# Patient Record
Sex: Female | Born: 1965
Health system: Southern US, Community
[De-identification: ages and names within clinical notes are randomized; demographics above are authoritative.]

## PROBLEM LIST (undated history)

## (undated) DIAGNOSIS — Z853 Personal history of malignant neoplasm of breast: Secondary | ICD-10-CM

## (undated) DIAGNOSIS — C801 Malignant (primary) neoplasm, unspecified: Secondary | ICD-10-CM

## (undated) DIAGNOSIS — F419 Anxiety disorder, unspecified: Secondary | ICD-10-CM

## (undated) DIAGNOSIS — M1990 Unspecified osteoarthritis, unspecified site: Secondary | ICD-10-CM

## (undated) DIAGNOSIS — M8580 Other specified disorders of bone density and structure, unspecified site: Secondary | ICD-10-CM

## (undated) HISTORY — PX: COSMETIC SURGERY: SHX468

## (undated) HISTORY — DX: Malignant (primary) neoplasm, unspecified: C80.1

## (undated) HISTORY — DX: Other specified disorders of bone density and structure, unspecified site: M85.80

---

## 1999-06-13 ENCOUNTER — Other Ambulatory Visit: Admission: RE | Admit: 1999-06-13 | Discharge: 1999-06-13 | Payer: Self-pay | Admitting: Obstetrics and Gynecology

## 2001-03-23 ENCOUNTER — Other Ambulatory Visit: Admission: RE | Admit: 2001-03-23 | Discharge: 2001-03-23 | Payer: Self-pay | Admitting: Obstetrics & Gynecology

## 2001-10-31 ENCOUNTER — Inpatient Hospital Stay (HOSPITAL_COMMUNITY): Admission: AD | Admit: 2001-10-31 | Discharge: 2001-11-03 | Payer: Self-pay | Admitting: *Deleted

## 2001-11-04 ENCOUNTER — Encounter: Admission: RE | Admit: 2001-11-04 | Discharge: 2001-12-04 | Payer: Self-pay | Admitting: Obstetrics & Gynecology

## 2001-11-30 ENCOUNTER — Other Ambulatory Visit: Admission: RE | Admit: 2001-11-30 | Discharge: 2001-11-30 | Payer: Self-pay | Admitting: Obstetrics & Gynecology

## 2004-05-16 ENCOUNTER — Ambulatory Visit (HOSPITAL_COMMUNITY): Admission: RE | Admit: 2004-05-16 | Discharge: 2004-05-16 | Payer: Self-pay | Admitting: Obstetrics & Gynecology

## 2008-05-17 ENCOUNTER — Ambulatory Visit (HOSPITAL_COMMUNITY): Admission: RE | Admit: 2008-05-17 | Discharge: 2008-05-17 | Payer: Self-pay | Admitting: Obstetrics & Gynecology

## 2009-05-19 ENCOUNTER — Ambulatory Visit (HOSPITAL_COMMUNITY): Admission: RE | Admit: 2009-05-19 | Discharge: 2009-05-19 | Payer: Self-pay | Admitting: Obstetrics & Gynecology

## 2010-07-12 ENCOUNTER — Encounter (HOSPITAL_COMMUNITY): Payer: BC Managed Care – PPO

## 2010-07-12 LAB — SURGICAL PCR SCREEN: MRSA, PCR: NEGATIVE

## 2010-07-12 LAB — CBC
MCH: 29.7 pg (ref 26.0–34.0)
MCV: 88 fL (ref 78.0–100.0)
RDW: 13.3 % (ref 11.5–15.5)

## 2010-07-13 LAB — RPR: RPR Ser Ql: NONREACTIVE

## 2010-07-17 ENCOUNTER — Other Ambulatory Visit: Payer: Self-pay | Admitting: Obstetrics and Gynecology

## 2010-07-17 ENCOUNTER — Inpatient Hospital Stay (HOSPITAL_COMMUNITY)
Admission: AD | Admit: 2010-07-17 | Discharge: 2010-07-20 | DRG: 371 | Disposition: A | Discharge status: Home / Self Care | Payer: BC Managed Care – PPO | Source: Ambulatory Visit | Attending: Obstetrics and Gynecology | Admitting: Obstetrics and Gynecology

## 2010-07-17 DIAGNOSIS — Z01818 Encounter for other preprocedural examination: Secondary | ICD-10-CM

## 2010-07-17 DIAGNOSIS — Z302 Encounter for sterilization: Secondary | ICD-10-CM

## 2010-07-17 DIAGNOSIS — O09529 Supervision of elderly multigravida, unspecified trimester: Secondary | ICD-10-CM | POA: Diagnosis present

## 2010-07-17 DIAGNOSIS — O34219 Maternal care for unspecified type scar from previous cesarean delivery: Principal | ICD-10-CM | POA: Diagnosis present

## 2010-07-17 DIAGNOSIS — Z01812 Encounter for preprocedural laboratory examination: Secondary | ICD-10-CM

## 2010-07-18 HISTORY — PX: TUBAL LIGATION: SHX77

## 2010-07-18 LAB — CBC
HCT: 38.5 % (ref 36.0–46.0)
Hemoglobin: 13 g/dL (ref 12.0–15.0)
MCH: 29.7 pg (ref 26.0–34.0)
MCHC: 33.8 g/dL (ref 30.0–36.0)
MCV: 88.1 fL (ref 78.0–100.0)
RBC: 4.37 MIL/uL (ref 3.87–5.11)

## 2010-07-19 ENCOUNTER — Inpatient Hospital Stay (HOSPITAL_COMMUNITY)
Admission: RE | Admit: 2010-07-19 | Payer: BC Managed Care – PPO | Source: Ambulatory Visit | Admitting: Obstetrics & Gynecology

## 2010-07-19 LAB — CBC
RBC: 3.93 MIL/uL (ref 3.87–5.11)
WBC: 12.1 10*3/uL — ABNORMAL HIGH (ref 4.0–10.5)

## 2010-07-20 NOTE — Op Note (Signed)
Lori [name], BRAU NO.:  [id number]   MEDICAL RECORD NO.:  [id number]                   PATIENT TYPE:  INP   LOCATION:  9129                                 FACILITY:  [facility]   PHYSICIAN:  Gerri Spore B. Earlene Plater, M.D.               DATE OF BIRTH:  Jan 27, 1966   DATE OF PROCEDURE:  03/03/2002  DATE OF DISCHARGE:                                 OPERATIVE REPORT   PREOPERATIVE DIAGNOSES:  1. Nonreassuring fetal heart rate tracing.  2. Chorioamnionitis.  3. Failure to descend.  4. Suspected macrosomia.   POSTOPERATIVE DIAGNOSES:  1. Nonreassuring fetal heart rate tracing.  2. Chorioamnionitis.  3. Failure to descend.  4. Suspected macrosomia.   PROCEDURE:  Primary low transverse cesarean section.   SURGEON:  Chester Holstein. Earlene Plater, M.D.   ASSISTANT:  Scrub tech.   ANESTHESIA:  Epidural.   FINDINGS:  Viable female, Apgars 9 and 9.  Weight 9 pounds 12 ounces.  Normal uterus, tubes and ovaries.   ESTIMATED BLOOD LOSS:  600 cc.   FLUIDS:  1000 cc.   URINE OUTPUT:  100 cc.   COMPLICATIONS:  None.   DRAINS:  Foley.   DISPOSITION:  To the recovery room stable.   INDICATIONS:  The patient presented in spontaneous labor, progressed to  complete/complete, +1.  Developed fever in labor and subsequently developed  nonreassuring tracing and was therefore taken for primary cesarean section.   DESCRIPTION OF PROCEDURE:  The patient was taken to the operating room and  epidural anesthesia redosed and deemed adequate for surgery.  She was  prepped and draped in the standard fashion, placed in the supine position  with a leftward tilt.  Foley catheter was already in the bladder.   A Pfannenstiel incision was made with the knife after local infiltration  with 10 cc of 0.5% Marcaine.  The dissection was carried sharply to the  underlying fascia.  The fascia was divided in the midline sharply and  extended laterally with Mayo scissors.  Kocher clamps were used to  elevate  the superior aspect of the incision, and its underlying rectus muscles were  dissected off sharply.  The inferior aspect done in a similar fashion.  The  midline of the rectus muscles was identified, elevated and the posterior  sheath and peritoneum elevated and entered sharply with Metzenbaum scissors,  extended superiorly and inferiorly with good visualization of the  surrounding organs.   The bladder blade was inserted.  The vesicouterine peritoneum was  identified, elevated and bladder flap created with sharp and blunt  technique.   The bladder blade was reinserted, and the uterine incision made in a low  transverse fashion with the knife, extended laterally bluntly.  Clear fluid  was again noted to empty into the cavity.  The infant's head was elevated  and delivered through the incision.  The nuchal cord x2 was noted.  The  nose  and mouth were suctioned with the bulb, and the remainder of the infant  delivered without difficulty.  The cord was clamped and cut and the infant  handed off to the awaiting pediatricians.  The patient already had received  ampicillin and gentamicin intrapartum, and therefore no additional  antibiotics were administered.   The placenta was removed by uterine massage.  The uterus was exteriorized  and cleared of all clots and debris.  Then the uterine incision was  inspected and was free of extension.  It was then closed in a running locked  stitch of 0 Monocryl.  A second imbricating layer was placed with the same  suture.  Hemostasis was obtained.   The uterus was returned to the abdomen, the pelvis irrigated.  The uterine  incision was reinspected and made hemostatic with the Bovie.  The bladder  flap and subfascial spaces were hemostatic.  The fascia was closed with a  running stitch of 0 Vicryl.  The subcutaneous tissue was irrigated and made  hemostatic with the Bovie.  The skin was closed with staples.   The patient tolerated the  procedure well with no complications.  She was  taken to the recovery room awake, alert and in stable condition.  All counts  were correct.                                               Gerri Spore B. Earlene Plater, M.D.    WBD/MEDQ  D:  10/31/2001  T:  11/02/2001  Job:  [id number]

## 2010-07-20 NOTE — Discharge Summary (Signed)
   NAMEBONNI, NEUSER NO.:  [id number]   MEDICAL RECORD NO.:  [id number]                   PATIENT TYPE:  INP   LOCATION:  9129                                 FACILITY:  [facility]   PHYSICIAN:  Gerri Spore B. Earlene Plater, M.D.               DATE OF BIRTH:  1965/03/09   DATE OF ADMISSION:  10/31/2001  DATE OF DISCHARGE:  11/03/2001                                 DISCHARGE SUMMARY   ADMISSION DIAGNOSES:  Term intrauterine pregnancy, spontaneous labor,  failure to descend, chorioamnionitis.   DISCHARGE DIAGNOSES:  Term intrauterine pregnancy, spontaneous labor,  failure to descend, chorioamnionitis.   PROCEDURE:  Admission for spontaneous labor, primary cesarean section for  failure to descend greater than +1 station, and chorioamnionitis.   FINDINGS:  Viable female infant 9 pounds 12 ounces.  Apgars 9/9.  Nuchal  cord x2.  Normal uterus, tubes, and ovaries.   HISTORY OF PRESENT ILLNESS:  The patient was admitted in spontaneous labor  at 40 weeks 0 days.  Subsequently progressed to complete complete and +1.  Was pushing for about an hour when she developed a nonreassuring tracing  with associated fever in labor.  In addition, while patient was pushing  there was no descent.  She was at +1 station and suspicion for macrosomia.  Therefore, proceeded with primary cesarean section.  Findings at time of  surgery as outlined above.   Postoperatively patient rapidly regained her ability to ambulate, void, and  tolerate a regular diet.  She was discharged home on the third postoperative  day in satisfactory condition.   DISCHARGE INSTRUCTIONS:  Standard preprinted instructions were given prior  to dismissal.   DISCHARGE MEDICATIONS:  1. Motrin 600 mg p.o. q.6h.  2. Tylox one to two p.o. q.4-6h.  3. Ferrous sulfate 325 mg p.o. daily.   FOLLOW UP:  [facility] four to six weeks for postpartum visit.                                               Gerri Spore B.  Earlene Plater, M.D.    WBD/MEDQ  D:  11/03/2001  T:  11/03/2001  Job:  [phone] [id number]

## 2010-07-21 NOTE — Op Note (Signed)
Lori [name], Lori [name] NO.:  [id number]  MEDICAL RECORD NO.:  [id number]           PATIENT TYPE:  LOCATION:                                 FACILITY:  PHYSICIAN:  Maxie Better, M.D.DATE OF BIRTH:  09-Nov-1965  DATE OF PROCEDURE:  07/18/2010 DATE OF DISCHARGE:                              OPERATIVE REPORT   PREOPERATIVE DIAGNOSES:  Spontaneous rupture of membranes, term gestation, previous cesarean section, desires sterilization.  POSTOPERATIVE DIAGNOSES:  Spontaneous rupture of membranes, term gestation, previous cesarean section, desires sterilization.  PROCEDURE:  Repeat cesarean section, Sharl Ma hysterotomy, modified Pomeroy tubal ligation.  ANESTHESIA:  Spinal.  SURGEON:  Maxie Better, MD  ASSISTANT:  None.  INDICATIONS:  A 45 year old gravida 2, para 1 female, previous cesarean section, now at term presented with spontaneous rupture of membranes. The patient does not desire attempted vaginal delivery.  She also desires permanent sterilization.  Prenatal course had been notable for increased risk for Down syndrome on her ultra screen for which the patient underwent an amnio with normal chromosomal studies.  Surgical risks was reviewed with the patient, consent signed.  The patient was transferred to the operating room.  PROCEDURE:  Under adequate spinal anesthesia, the patient was placed in supine position with a left lateral tilt.  She was sterilely prepped and draped in usual fashion.  Indwelling Foley catheter was sterilely placed.  Marcaine 0.25% was injected along the previous Pfannenstiel skin incision.  Pfannenstiel skin incision was then made carried down to the rectus fascia.  Rectus fascia was opened transversely.  Rectus fascia was then bluntly and sharply dissected off the rectus muscle in superior and inferior fashion.  The rectus muscle was split in midline. The parietal peritoneum was entered sharply and extended.  The  bladder had some adhesions, vesicouterine peritoneum was opened transversely. The bladder was carefully dissected off the lower uterine segment and displaced inferiorly with a bladder retractor.  A curvilinear low transverse uterine incision was then made and extended with bandage scissors.  Subsequent delivery of a live female from the direct occiput posterior presentation was accomplished.  Baby was bulb suctioned.  The abdomen cord was clamped, cut.  The baby was transferred to the awaiting pediatrician who assigned Apgars of 9 and 9 at 1 and 5 minutes.  The placenta was manually removed.  The uterine cavity was cleaned of debris.  Uterine incision had no extension.  It was closed in 2 layers the first layer 0 Monocryl running locked stitch, second layer imbricating using 0 Monocryl sutures.  The abdomen was then copiously irrigated and suctioned of debris at that time both fallopian tubes were then identified down to the fimbriated end.  The midportion of both fallopian tube was grasped with a Babcock.  The underlying mesosalpinx was opened with cautery bilaterally.  The portion of the intervening segment of fallopian tube was then tied proximally and distally on both side using 0 chromic sutures x2 proximally and distally and the intervening segment on right and left side was removed.  Normal ovaries were noted bilaterally with good hemostasis noted along the incision line.  The parietal peritoneum  was closed with 0 Vicryl.  The rectus fascia was closed with 0 Vicryl x2.  The subcutaneous areas irrigated, small bleeders cauterized.  Interrupted 2-0 plain sutures placed in the skin and approximated with Ethicon staples.  SPECIMEN:  Portion of right and left fallopian tubes sent to Pathology. Placenta was not sent.  ESTIMATED BLOOD LOSS:  650 mL.  INTRAOPERATIVE FLUID:  2800 mL.  URINE OUTPUT:  500 mL clear yellow urine.  Sponge and instrument counts x2 was  correct.  COMPLICATIONS:  None.  Weight of the baby was 7 pounds 14 ounces.  The patient tolerated the procedure well and was transferred to recovery in stable condition.     Maxie Better, M.D.     [place]/MEDQ  D:  07/18/2010  T:  07/18/2010  Job:  [id number]  Electronically Signed by Nena Jordan Emmy Keng M.D. on 07/21/2010 06:51:51 PM

## 2010-08-12 NOTE — Discharge Summary (Signed)
  NAMEJAKERIA, Lori [name]               ACCOUNT NO.:  [id number]  MEDICAL RECORD NO.:  [id number]           PATIENT TYPE:  I  LOCATION:  9148                          FACILITY:  [facility]  PHYSICIAN:  Maxie Better, M.D.DATE OF BIRTH:  03-Nov-1965  DATE OF ADMISSION:  07/17/2010 DATE OF DISCHARGE:  07/20/2010                              DISCHARGE SUMMARY   ADMITTING DIAGNOSES:  39 weeks' gestation, spontaneous rupture of membranes, previous cesarean section, desires repeat Cesarean section, undesired fertility.  DISCHARGE DIAGNOSIS:  Postoperative day #2 status post cesarean section with bilateral tubal sterilization.  PATIENT HISTORY:  The patient is a 45 year old gravida 2, para 1 at 11 weeks' gestation with an [facility] of Jul 24, 2010.  The patient has received prenatal care at [facility] since 7 weeks' gestation with Dr. Seymour Bars as primary.  PRENATAL LABORATORY DATA:  Blood type and Rh A+, antibody screen negative, rubella positive, hepatitis B negative, HIV negative, RPR negative, GBS negative, and GTT screening within normal limits.  CURRENT MEDICATIONS: 1. Prenatal vitamin 1 tablet p.o. daily. 2. ProAir. 3. Advair.  ALLERGIES:  SHELLFISH.  PAST MEDICAL HISTORY:  Asthma.  PAST SURGICAL HISTORY:  Cesarean section x1, LEEP, oral surgery with extraction of wisdom teeth.  PRESENTATION:  The patient presents with spontaneous rupture of membranes at 10:00 p.m. with onset of labor.  Cervix was closed on exam. Physical within normal limits.  Admission CBC, white blood cell count of 13.5, hemoglobin 13, hematocrit 38.5, and a platelet count of 191. Decision was made to proceed with cesarean section and tubal sterilization.   PROCEDURE: The patient undergoes cesarean section by Dr. Cherly Hensen on Jul 17, 2010 at 3:30 a.m., delivery of a female, 7 pounds 14 ounces, Apgar score of 9 and 9 under spinal anesthesia, bilateral tubal sterilization at time of surgery.  See operative  report for further details.  POSTOPERATIVE COURSE:  The patient's postoperative course was uneventful.  Postoperative CBC, white blood cell count 12.1, hemoglobin 11.6, hematocrit 35.1, and platelet count of 160.  Vital signs were stable.  The patient remains afebrile during hospitalization.  Wound is well approximated with staples.  There is no erythema, no ecchymosis, no drainage.  Discharged home in stable condition on postoperative day #2 with staples intact.  DISCHARGE INSTRUCTIONS:  Discharge care per [facility] discharge booklet.  ACTIVITY:  Routine.  DIET:  Regular.  DISCHARGE MEDICATIONS: 1. Prenatal vitamin 1 tablet p.o. daily. 2. Continue ProAir and Advair for management of asthma. 3. Ibuprofen 800 mg every hours as needed for discomfort. 4. Percocet 1 tablet every 6 hours as needed for pain.  FOLLOWUP:  The patient is to return to [facility] on Monday, Jul 23, 2010 for staple removal and in 6 weeks for routine postpartum care.     Marlinda Mike, C.N.M.   ______________________________ Maxie Better, M.D.    TB/MEDQ  D:  07/20/2010  T:  07/21/2010  Job:  [id number]  Electronically Signed by Marlinda Mike C.N.M. on 07/26/2010 01:31:15 PM Electronically Signed by Nena Jordan Stephenia Vogan M.D. on 08/12/2010 08:52:14 PM

## 2010-09-04 ENCOUNTER — Other Ambulatory Visit: Payer: Self-pay | Admitting: Obstetrics & Gynecology

## 2013-03-11 ENCOUNTER — Other Ambulatory Visit (HOSPITAL_COMMUNITY): Payer: Self-pay | Admitting: Obstetrics & Gynecology

## 2013-03-11 DIAGNOSIS — Z1231 Encounter for screening mammogram for malignant neoplasm of breast: Secondary | ICD-10-CM

## 2013-03-24 ENCOUNTER — Ambulatory Visit (HOSPITAL_COMMUNITY)
Admission: RE | Admit: 2013-03-24 | Discharge: 2013-03-24 | Disposition: A | Discharge status: Home / Self Care | Payer: BC Managed Care – PPO | Source: Ambulatory Visit | Attending: Obstetrics & Gynecology | Admitting: Obstetrics & Gynecology

## 2013-03-24 DIAGNOSIS — Z1231 Encounter for screening mammogram for malignant neoplasm of breast: Secondary | ICD-10-CM

## 2013-07-03 ENCOUNTER — Other Ambulatory Visit: Payer: Self-pay | Admitting: Orthopedic Surgery

## 2013-07-13 ENCOUNTER — Encounter (HOSPITAL_COMMUNITY): Payer: Self-pay | Admitting: Pharmacy Technician

## 2013-07-19 NOTE — Patient Instructions (Addendum)
Your procedure is scheduled on:  07/27/13  TUESDAY  Report to [facility] at   40    AM.  Call this number if you have problems the morning of surgery: [phone]     DRIVER-- MOM OR HUSBAND DO NOT TAKE ANY MEDICATIONS THE MORNING OF SURGERY DO NOT BRING ANY VALUABLES WITH YOU--no purse or jewlery  Do not eat food After Midnight.MONDAY NIGHT.  MAY HAVE CLEAR LIQUIDS Tuesday MORNING UNTIL 06:30am-  THEN NOTHING by [place] Before surgery, you can play an important role.  Because skin is not sterile, your skin needs to be as free of germs as possible.  You can reduce the number of germs on your skin by washing with CHG (chlorahexidine gluconate) soap before surgery.  CHG is an antiseptic cleaner which kills germs and bonds with the skin to continue killing germs even after washing. Please DO NOT use if you have an allergy to CHG or antibacterial soaps.  If your skin becomes reddened/irritated stop using the CHG and inform your nurse when you arrive at Short Stay. Do not shave (including legs and underarms) for at least 48 hours prior to the first CHG shower.  You may shave your face. Please follow these instructions carefully:  1.  Shower with CHG Soap the night before surgery and the  morning of Surgery.  2.  If you choose to wash your hair, wash your hair first as usual with your  normal  shampoo.  3.  After you shampoo, rinse your hair and body thoroughly to remove the  shampoo.                           4.  Use CHG as you would any other liquid soap.  You can apply chg directly  to the skin and wash                       Gently with a scrungie or clean washcloth.  5.  Apply the CHG Soap to your body ONLY FROM THE NECK DOWN.   Do not use on open                           Wound or open sores. Avoid contact with eyes, ears mouth and genitals (private parts).                        Genitals (private parts) with your normal soap.             6.   Wash thoroughly, paying special attention to the area where your surgery  will be performed.  7.  Thoroughly rinse your body with warm water from the neck down.  8.  DO NOT shower/wash with your normal soap after using and rinsing off  the CHG Soap.                9.  Pat yourself dry with a clean towel.            10.  Wear clean pajamas.            11.  Place clean sheets on your bed the night of your first shower and do not  sleep with pets. Day of Surgery : Do not apply any lotions/deodorants the morning of surgery.  Please  wear clean clothes to the hospital/surgery center.  FAILURE TO FOLLOW THESE INSTRUCTIONS MAY RESULT IN THE CANCELLATION OF YOUR SURGERY PATIENT SIGNATURE_________________________________  NURSE SIGNATURE__________________________________  ________________________________________________________________________    CLEAR LIQUID DIET   Foods Allowed                                                                     Foods Excluded  Coffee and tea, regular and decaf                             liquids that you cannot  Plain Jell-O in any flavor                                             see through such as: Fruit ices (not with fruit pulp)                                     milk, soups, orange juice  Iced Popsicles                                    All solid food Carbonated beverages, regular and diet                                    Cranberry, grape and apple juices Sports drinks like Gatorade Lightly seasoned clear broth or consume(fat free) Sugar, honey syrup  Sample Menu Breakfast                                Lunch                                     Supper Cranberry juice                    Beef broth                            Chicken broth Jell-O                                     Grape juice                           Apple juice Coffee or tea                        Jell-O  Popsicle                                                 Coffee or tea                        Coffee or tea  _____________________________________________________________________

## 2013-07-20 ENCOUNTER — Encounter (HOSPITAL_COMMUNITY)
Admission: RE | Admit: 2013-07-20 | Discharge: 2013-07-20 | Disposition: A | Discharge status: Home / Self Care | Payer: BC Managed Care – PPO | Source: Ambulatory Visit | Attending: Orthopedic Surgery | Admitting: Orthopedic Surgery

## 2013-07-20 ENCOUNTER — Encounter (INDEPENDENT_AMBULATORY_CARE_PROVIDER_SITE_OTHER): Payer: Self-pay

## 2013-07-20 ENCOUNTER — Encounter (HOSPITAL_COMMUNITY): Payer: Self-pay

## 2013-07-20 DIAGNOSIS — Z01812 Encounter for preprocedural laboratory examination: Secondary | ICD-10-CM | POA: Insufficient documentation

## 2013-07-20 LAB — HCG, SERUM, QUALITATIVE: Preg, Serum: NEGATIVE

## 2013-07-27 ENCOUNTER — Encounter (HOSPITAL_COMMUNITY)
Admission: RE | Disposition: A | Discharge status: Home / Self Care | Payer: Self-pay | Source: Ambulatory Visit | Attending: Orthopedic Surgery

## 2013-07-27 ENCOUNTER — Ambulatory Visit (HOSPITAL_COMMUNITY)
Admission: RE | Admit: 2013-07-27 | Discharge: 2013-07-27 | Disposition: A | Discharge status: Home / Self Care | Payer: BC Managed Care – PPO | Source: Ambulatory Visit | Attending: Orthopedic Surgery | Admitting: Orthopedic Surgery

## 2013-07-27 ENCOUNTER — Encounter (HOSPITAL_COMMUNITY): Payer: BC Managed Care – PPO | Admitting: Certified Registered Nurse Anesthetist

## 2013-07-27 ENCOUNTER — Encounter (HOSPITAL_COMMUNITY): Payer: Self-pay | Admitting: *Deleted

## 2013-07-27 ENCOUNTER — Ambulatory Visit (HOSPITAL_COMMUNITY): Payer: BC Managed Care – PPO | Admitting: Certified Registered Nurse Anesthetist

## 2013-07-27 DIAGNOSIS — Z79899 Other long term (current) drug therapy: Secondary | ICD-10-CM | POA: Insufficient documentation

## 2013-07-27 DIAGNOSIS — S83249A Other tear of medial meniscus, current injury, unspecified knee, initial encounter: Secondary | ICD-10-CM | POA: Diagnosis present

## 2013-07-27 DIAGNOSIS — IMO0002 Reserved for concepts with insufficient information to code with codable children: Secondary | ICD-10-CM | POA: Insufficient documentation

## 2013-07-27 DIAGNOSIS — J45909 Unspecified asthma, uncomplicated: Secondary | ICD-10-CM | POA: Insufficient documentation

## 2013-07-27 DIAGNOSIS — Z87891 Personal history of nicotine dependence: Secondary | ICD-10-CM | POA: Insufficient documentation

## 2013-07-27 DIAGNOSIS — X58XXXA Exposure to other specified factors, initial encounter: Secondary | ICD-10-CM | POA: Insufficient documentation

## 2013-07-27 HISTORY — PX: KNEE ARTHROSCOPY: SHX127

## 2013-07-27 SURGERY — ARTHROSCOPY, KNEE
Anesthesia: General | Site: Knee | Laterality: Left

## 2013-07-27 MED ORDER — FENTANYL CITRATE 0.05 MG/ML IJ SOLN
25.0000 ug | INTRAMUSCULAR | Status: DC | PRN
  Administered 2013-07-27 (×4): 25 ug via INTRAVENOUS

## 2013-07-27 MED ORDER — ONDANSETRON HCL 4 MG/2ML IJ SOLN
INTRAMUSCULAR | Status: DC | PRN
  Administered 2013-07-27: 4 mg via INTRAVENOUS

## 2013-07-27 MED ORDER — PROMETHAZINE HCL 25 MG/ML IJ SOLN: 6.2500 mg | INTRAMUSCULAR | Status: DC | PRN

## 2013-07-27 MED ORDER — 0.9 % SODIUM CHLORIDE (POUR BTL) OPTIME
TOPICAL | Status: DC | PRN
  Administered 2013-07-27: 500 mL

## 2013-07-27 MED ORDER — LACTATED RINGERS IR SOLN
Status: DC | PRN
  Administered 2013-07-27 (×2): 3000 mL

## 2013-07-27 MED ORDER — SODIUM CHLORIDE 0.9 % IV SOLN: INTRAVENOUS | Status: DC

## 2013-07-27 MED ORDER — ACETAMINOPHEN 10 MG/ML IV SOLN
INTRAVENOUS | Status: DC | PRN
  Administered 2013-07-27: 1000 mg via INTRAVENOUS

## 2013-07-27 MED ORDER — PROPOFOL 10 MG/ML IV BOLUS
INTRAVENOUS | Status: DC | PRN
  Administered 2013-07-27: 150 mg via INTRAVENOUS
  Administered 2013-07-27: 100 mg via INTRAVENOUS

## 2013-07-27 MED ORDER — ACETAMINOPHEN 10 MG/ML IV SOLN
1000.0000 mg | Freq: Once | INTRAVENOUS | Status: DC
  Filled 2013-07-27: qty 100

## 2013-07-27 MED ORDER — BUPIVACAINE-EPINEPHRINE 0.25% -1:200000 IJ SOLN
INTRAMUSCULAR | Status: DC | PRN
  Administered 2013-07-27: 20 mL

## 2013-07-27 MED ORDER — CEFAZOLIN SODIUM-DEXTROSE 2-3 GM-% IV SOLR
2.0000 g | INTRAVENOUS | Status: AC
  Administered 2013-07-27: 2 g via INTRAVENOUS

## 2013-07-27 MED ORDER — DEXAMETHASONE SODIUM PHOSPHATE 10 MG/ML IJ SOLN
10.0000 mg | Freq: Once | INTRAMUSCULAR | Status: AC
  Administered 2013-07-27: 10 mg via INTRAVENOUS

## 2013-07-27 MED ORDER — FENTANYL CITRATE 0.05 MG/ML IJ SOLN
INTRAMUSCULAR | Status: AC
  Filled 2013-07-27: qty 5

## 2013-07-27 MED ORDER — LACTATED RINGERS IV SOLN
INTRAVENOUS | Status: DC
  Administered 2013-07-27: 1000 mL via INTRAVENOUS
  Administered 2013-07-27: 14:00:00 via INTRAVENOUS

## 2013-07-27 MED ORDER — PROPOFOL 10 MG/ML IV BOLUS
INTRAVENOUS | Status: AC
  Filled 2013-07-27: qty 20

## 2013-07-27 MED ORDER — LIDOCAINE HCL (CARDIAC) 20 MG/ML IV SOLN
INTRAVENOUS | Status: DC | PRN
  Administered 2013-07-27: 100 mg via INTRAVENOUS

## 2013-07-27 MED ORDER — FENTANYL CITRATE 0.05 MG/ML IJ SOLN
INTRAMUSCULAR | Status: DC | PRN
  Administered 2013-07-27 (×2): 25 ug via INTRAVENOUS

## 2013-07-27 MED ORDER — MIDAZOLAM HCL 2 MG/2ML IJ SOLN
INTRAMUSCULAR | Status: AC
  Filled 2013-07-27: qty 2

## 2013-07-27 MED ORDER — ONDANSETRON HCL 4 MG/2ML IJ SOLN
INTRAMUSCULAR | Status: AC
  Filled 2013-07-27: qty 2

## 2013-07-27 MED ORDER — METHOCARBAMOL 500 MG PO TABS: 500.0000 mg | ORAL_TABLET | Freq: Four times a day (QID) | ORAL | Status: DC

## 2013-07-27 MED ORDER — FENTANYL CITRATE 0.05 MG/ML IJ SOLN
INTRAMUSCULAR | Status: AC
  Filled 2013-07-27: qty 2

## 2013-07-27 MED ORDER — MIDAZOLAM HCL 5 MG/5ML IJ SOLN
INTRAMUSCULAR | Status: DC | PRN
  Administered 2013-07-27: 1 mg via INTRAVENOUS

## 2013-07-27 MED ORDER — HYDROCODONE-ACETAMINOPHEN 5-325 MG PO TABS: 1.0000 | ORAL_TABLET | Freq: Four times a day (QID) | ORAL | Status: DC | PRN

## 2013-07-27 MED ORDER — DEXAMETHASONE SODIUM PHOSPHATE 10 MG/ML IJ SOLN
INTRAMUSCULAR | Status: AC
  Filled 2013-07-27: qty 1

## 2013-07-27 MED ORDER — CEFAZOLIN SODIUM-DEXTROSE 2-3 GM-% IV SOLR
INTRAVENOUS | Status: AC
  Filled 2013-07-27: qty 50

## 2013-07-27 MED ORDER — BUPIVACAINE-EPINEPHRINE (PF) 0.25% -1:200000 IJ SOLN
INTRAMUSCULAR | Status: AC
  Filled 2013-07-27: qty 30

## 2013-07-27 MED ORDER — LIDOCAINE HCL (CARDIAC) 20 MG/ML IV SOLN
INTRAVENOUS | Status: AC
  Filled 2013-07-27: qty 5

## 2013-07-27 SURGICAL SUPPLY — 27 items
BANDAGE ELASTIC 6 VELCRO ST LF (GAUZE/BANDAGES/DRESSINGS) ×3 IMPLANT
BLADE 4.2CUDA (BLADE) ×3 IMPLANT
BNDG GAUZE ELAST 4 BULKY (GAUZE/BANDAGES/DRESSINGS) ×3 IMPLANT
CLOTH BEACON ORANGE TIMEOUT ST (SAFETY) ×3 IMPLANT
COUNTER NEEDLE 20 DBL MAG RED (NEEDLE) ×3 IMPLANT
CUFF TOURN SGL QUICK 34 (TOURNIQUET CUFF) ×2
CUFF TRNQT CYL 34X4X40X1 (TOURNIQUET CUFF) ×1 IMPLANT
DRAPE U-SHAPE 47X51 STRL (DRAPES) ×3 IMPLANT
DRSG EMULSION OIL 3X3 NADH (GAUZE/BANDAGES/DRESSINGS) ×3 IMPLANT
DURAPREP 26ML APPLICATOR (WOUND CARE) ×3 IMPLANT
GLOVE BIO SURGEON STRL SZ8 (GLOVE) ×3 IMPLANT
GLOVE BIOGEL PI IND STRL 8 (GLOVE) ×1 IMPLANT
GLOVE BIOGEL PI INDICATOR 8 (GLOVE) ×2
GOWN STRL REUS W/TWL LRG LVL3 (GOWN DISPOSABLE) ×3 IMPLANT
MANIFOLD NEPTUNE II (INSTRUMENTS) ×3 IMPLANT
PACK ARTHROSCOPY WL (CUSTOM PROCEDURE TRAY) ×3 IMPLANT
PACK ICE MAXI GEL EZY WRAP (MISCELLANEOUS) ×9 IMPLANT
PAD ABD 8X10 STRL (GAUZE/BANDAGES/DRESSINGS) ×3 IMPLANT
PADDING CAST COTTON 6X4 STRL (CAST SUPPLIES) ×3 IMPLANT
POSITIONER SURGICAL ARM (MISCELLANEOUS) ×3 IMPLANT
SET ARTHROSCOPY TUBING (MISCELLANEOUS) ×2
SET ARTHROSCOPY TUBING LN (MISCELLANEOUS) ×1 IMPLANT
SPONGE GAUZE 4X4 12PLY (GAUZE/BANDAGES/DRESSINGS) ×3 IMPLANT
SUT ETHILON 4 0 PS 2 18 (SUTURE) ×3 IMPLANT
TOWEL OR 17X26 10 PK STRL BLUE (TOWEL DISPOSABLE) ×3 IMPLANT
WAND 90 DEG TURBOVAC W/CORD (SURGICAL WAND) ×3 IMPLANT
WRAP KNEE MAXI GEL POST OP (GAUZE/BANDAGES/DRESSINGS) ×3 IMPLANT

## 2013-07-27 NOTE — Interval H&P Note (Signed)
History and Physical Interval Note:  07/27/2013 1:21 PM  Lori [name]  has presented today for surgery, with the diagnosis of LEFT KNEE MEDIAL MENISCAL TEAR  The various methods of treatment have been discussed with the patient and family. After consideration of risks, benefits and other options for treatment, the patient has consented to  Procedure(s): LEFT ARTHROSCOPY KNEE WITH DEBRIDEMENT (Left) as a surgical intervention .  The patient's history has been reviewed, patient examined, no change in status, stable for surgery.  I have reviewed the patient's chart and labs.  Questions were answered to the patient's satisfaction.     Dione Plover Culver Feighner

## 2013-07-27 NOTE — Transfer of Care (Signed)
Immediate Anesthesia Transfer of Care Note  Patient: Lori [name]  Procedure(s) Performed: Procedure(s) (LRB): LEFT ARTHROSCOPY KNEE WITH MEDIAL MENISCAL DEBRIDEMENT (Left)  Patient Location: PACU  Anesthesia Type: General  Level of Consciousness: sedated, patient cooperative and responds to stimulation  Airway & Oxygen Therapy: Patient Spontanous Breathing and Patient connected to face mask oxgen  Post-op Assessment: Report given to PACU RN and Post -op Vital signs reviewed and stable  Post vital signs: Reviewed and stable  Complications: No apparent anesthesia complications

## 2013-07-27 NOTE — Anesthesia Preprocedure Evaluation (Signed)
Anesthesia Evaluation  Patient identified by MRN, date of birth, ID band Patient awake    Reviewed: Allergy & Precautions, H&P , NPO status , Patient's Chart, lab work & pertinent test results  Airway Mallampati: II TM Distance: >3 FB Neck ROM: Full    Dental no notable dental hx.    Pulmonary asthma , former smoker,  breath sounds clear to auscultation  Pulmonary exam normal       Cardiovascular negative cardio ROS  Rhythm:Regular Rate:Normal     Neuro/Psych negative neurological ROS  negative psych ROS   GI/Hepatic negative GI ROS, Neg liver ROS,   Endo/Other  negative endocrine ROS  Renal/GU negative Renal ROS  negative genitourinary   Musculoskeletal negative musculoskeletal ROS (+)   Abdominal   Peds negative pediatric ROS (+)  Hematology negative hematology ROS (+)   Anesthesia Other Findings   Reproductive/Obstetrics negative OB ROS                           Anesthesia Physical Anesthesia Plan  ASA: II  Anesthesia Plan: General   Post-op Pain Management:    Induction: Intravenous  Airway Management Planned: LMA  Additional Equipment:   Intra-op Plan:   Post-operative Plan: Extubation in OR  Informed Consent: I have reviewed the patients History and Physical, chart, labs and discussed the procedure including the risks, benefits and alternatives for the proposed anesthesia with the patient or authorized representative who has indicated his/her understanding and acceptance.   Dental advisory given  Plan Discussed with: CRNA  Anesthesia Plan Comments:         Anesthesia Quick Evaluation

## 2013-07-27 NOTE — H&P (Signed)
  CC- Lori [name] is a 48 y.o. female who presents with left knee pain.  HPI- . Knee Pain: Patient presents with knee pain involving the  left knee. Onset of the symptoms was several months ago. Inciting event: none known. Current symptoms include giving out, pain located medially and swelling. Pain is aggravated by going up and down stairs, lateral movements, pivoting, standing and walking.  Patient has had no prior knee problems. Evaluation to date: MRI: abnormal medial meniscal tear. Treatment to date: corticosteroid injection which was not very effective.  Past Medical History  Diagnosis Date  . Asthma     last trimester of pregnancy 3 yrs ago-  never had problems since then, no meds    Past Surgical History  Procedure Laterality Date  . Cesarean section      x 2    Prior to Admission medications   Medication Sig Start Date End Date Taking? Authorizing Provider  Multiple Vitamin (MULTIVITAMIN WITH MINERALS) TABS tablet Take 1 tablet by mouth daily.    Historical Provider, MD  omega-3 acid ethyl esters (LOVAZA) 1 G capsule Take 1 g by mouth daily.    Historical Provider, MD   LEFT KNEE EXAM antalgic gait, soft tissue tenderness over medial joint line, no effusion, negative drawer sign, collateral ligaments intact  Physical Examination: General appearance - alert, well appearing, and in no distress Mental status - alert, oriented to person, place, and time Chest - clear to auscultation, no wheezes, rales or rhonchi, symmetric air entry Heart - normal rate, regular rhythm, normal S1, S2, no murmurs, rubs, clicks or gallops Abdomen - soft, nontender, nondistended, no masses or organomegaly Neurological - alert, oriented, normal speech, no focal findings or movement disorder noted   Asessment/Plan--- Left knee medial meniscal tear- - Plan left knee arthroscopy with meniscal debridement. Procedure risks and potential comps discussed with patient who elects to proceed. Goals are  decreased pain and increased function with a high likelihood of achieving both

## 2013-07-27 NOTE — Discharge Instructions (Signed)

## 2013-07-27 NOTE — Op Note (Signed)
Preoperative diagnosis-  Left knee medial meniscal tear  Postoperative diagnosis Left- knee medial meniscal tear plus    Procedure- Left knee arthroscopy with medial meniscal debridement    Surgeon- Dione Plover. Mckinna Demars, MD  Anesthesia-General  EBL-  Minimal  Complications- None  Condition- PACU - hemodynamically stable.  Brief clinical note- -Lori [name] is a 48 y.o.  female with a several month history of left knee pain and mechanical symptoms. Exam and history suggested medial meniscal tear confirmed by MRI. The patient presents now for arthroscopy and debridement   Procedure in detail -       After successful administration of General anesthetic, a tourmiquet is placed high on the Left  thigh and the Left lower extremity is prepped and draped in the usual sterile fashion. Time out is performed by the surgical team. Standard superomedial and inferolateral portal sites are marked and incisions made with an 11 blade. The inflow cannula is passed through the superomedial portal and camera through the inferolateral portal and inflow is initiated. Arthroscopic visualization proceeds.      The undersurface of the patella and trochlea are visualized and are normal. The medial and lateral gutters are visualized and there are  no loose bodies. Flexion and valgus force is applied to the knee and the medial compartment is entered. A spinal needle is passed into the joint through the site marked for the inferomedial portal. A small incision is made and the dilator passed into the joint. The findings for the medial compartment are unstable tear at junction of body and posterior horn of medial meniscus . The tear is debrided to a stable base with baskets and a shaver and sealed off with the Arthrocare. It is probed and found to be stable.    The intercondylar notch is visualized and the ACL appears normal. The lateral compartment is entered and the findings are normal .      The joint is again inspected  and there are no other tears, defects or loose bodies identified. The arthroscopic equipment is then removed from the inferior portals which are closed with interrupted 4-0 nylon. 20 ml of .25% Marcaine with epinephrine are injected through the inflow cannula and the cannula is then removed and the portal closed with nylon. The incisions are cleaned and dried and a bulky sterile dressing is applied. The patient is then awakened and transported to recovery in stable condition.   07/27/2013, 2:03 PM

## 2013-07-28 ENCOUNTER — Encounter (HOSPITAL_COMMUNITY): Payer: Self-pay | Admitting: Orthopedic Surgery

## 2013-07-28 NOTE — Anesthesia Postprocedure Evaluation (Signed)
  Anesthesia Post-op Note  Patient: Lori [name]  Procedure(s) Performed: Procedure(s) (LRB): LEFT ARTHROSCOPY KNEE WITH MEDIAL MENISCAL DEBRIDEMENT (Left)  Patient Location: PACU  Anesthesia Type: General  Level of Consciousness: awake and alert   Airway and Oxygen Therapy: Patient Spontanous Breathing  Post-op Pain: mild  Post-op Assessment: Post-op Vital signs reviewed, Patient's Cardiovascular Status Stable, Respiratory Function Stable, Patent Airway and No signs of Nausea or vomiting  Last Vitals:  Filed Vitals:   07/27/13 1614  BP: 119/71  Pulse: 65  Temp: 36.7 C  Resp: 16    Post-op Vital Signs: stable   Complications: No apparent anesthesia complications

## 2013-10-02 ENCOUNTER — Emergency Department (HOSPITAL_BASED_OUTPATIENT_CLINIC_OR_DEPARTMENT_OTHER)
Admission: EM | Admit: 2013-10-02 | Discharge: 2013-10-02 | Disposition: A | Discharge status: Home / Self Care | Payer: BC Managed Care – PPO | Attending: Emergency Medicine | Admitting: Emergency Medicine

## 2013-10-02 ENCOUNTER — Encounter (HOSPITAL_BASED_OUTPATIENT_CLINIC_OR_DEPARTMENT_OTHER): Payer: Self-pay | Admitting: Emergency Medicine

## 2013-10-02 DIAGNOSIS — J45909 Unspecified asthma, uncomplicated: Secondary | ICD-10-CM | POA: Insufficient documentation

## 2013-10-02 DIAGNOSIS — Y9389 Activity, other specified: Secondary | ICD-10-CM | POA: Insufficient documentation

## 2013-10-02 DIAGNOSIS — Y929 Unspecified place or not applicable: Secondary | ICD-10-CM | POA: Insufficient documentation

## 2013-10-02 DIAGNOSIS — S61209A Unspecified open wound of unspecified finger without damage to nail, initial encounter: Secondary | ICD-10-CM | POA: Insufficient documentation

## 2013-10-02 DIAGNOSIS — Z87891 Personal history of nicotine dependence: Secondary | ICD-10-CM | POA: Insufficient documentation

## 2013-10-02 DIAGNOSIS — W292XXA Contact with other powered household machinery, initial encounter: Secondary | ICD-10-CM | POA: Insufficient documentation

## 2013-10-02 DIAGNOSIS — Z79899 Other long term (current) drug therapy: Secondary | ICD-10-CM | POA: Insufficient documentation

## 2013-10-02 DIAGNOSIS — S61011A Laceration without foreign body of right thumb without damage to nail, initial encounter: Secondary | ICD-10-CM

## 2013-10-02 MED ORDER — HYDROCODONE-ACETAMINOPHEN 5-325 MG PO TABS
1.0000 | ORAL_TABLET | Freq: Once | ORAL | Status: AC
  Administered 2013-10-02: 1 via ORAL
  Filled 2013-10-02: qty 1

## 2013-10-02 MED ORDER — CEPHALEXIN 500 MG PO CAPS: ORAL_CAPSULE | ORAL | Status: DC

## 2013-10-02 NOTE — ED Notes (Signed)
PT discharged to home with family. NAD. 

## 2013-10-02 NOTE — ED Provider Notes (Signed)
CSN: [id number]     Arrival date & time 10/02/13  1656 History   First MD Initiated Contact with Patient 10/02/13 2048    This chart was scribed for No att. providers found by Terressa Koyanagi, ED Scribe. This patient was seen in room MH04/MH04 and the patient's care was started at 8:50 PM.  Chief Complaint  Patient presents with  . Extremity Laceration   The history is provided by the patient.   HPI Comments: Lori [name] is a 48 y.o. female who presents to the Emergency Department complaining of laceration to the tip of her left thumb sustained earlier today while chopping an onion. Pt denies fever, chills, weakness, numbness, pus or drainage. Pt is UTD on her tetanus vaccine. Pain constant mild and localized, worse to touch. Bleeding controlled.  Past Medical History  Diagnosis Date  . Asthma     last trimester of pregnancy 3 yrs ago-  never had problems since then, no meds   Past Surgical History  Procedure Laterality Date  . Cesarean section      x 2  . Knee arthroscopy Left 07/27/2013    Procedure: LEFT ARTHROSCOPY KNEE WITH MEDIAL MENISCAL DEBRIDEMENT;  Surgeon: Gearlean Alf, MD;  Location: [facility];  Service: Orthopedics;  Laterality: Left;   No family history on file. History  Substance Use Topics  . Smoking status: Former Smoker -- 1.00 packs/day for 8 years    Types: Cigarettes    Quit date: 07/20/2000  . Smokeless tobacco: Never Used  . Alcohol Use: Yes     Comment: socially   OB History   Grav Para Term Preterm Abortions TAB SAB Ect Mult Living                 Review of Systems   See HPI  Allergies  Review of patient's allergies indicates no known allergies.  Home Medications   Prior to Admission medications   Medication Sig Start Date End Date Taking? Authorizing Provider  cephALEXin (KEFLEX) 500 MG capsule 2 caps po bid x 5 days 10/02/13   Babette Relic, MD  HYDROcodone-acetaminophen (NORCO) 5-325 MG per tablet Take 1-2 tablets by mouth every 6 (six)  hours as needed for moderate pain. 07/27/13   Gearlean Alf, MD  methocarbamol (ROBAXIN) 500 MG tablet Take 1 tablet (500 mg total) by mouth 4 (four) times daily. As needed for muscle spasm 07/27/13   Gearlean Alf, MD  Multiple Vitamin (MULTIVITAMIN WITH MINERALS) TABS tablet Take 1 tablet by mouth daily.    Historical Provider, MD  omega-3 acid ethyl esters (LOVAZA) 1 G capsule Take 1 g by mouth daily.    Historical Provider, MD   Triage Vitals: BP 122/69  Pulse 72  Temp(Src) 98.1 F (36.7 C) (Oral)  Resp 18  Ht 5\' 7"  (1.702 m)  Wt 191 lb (86.637 kg)  BMI 29.91 kg/m2  SpO2 98% Physical Exam  Nursing note and vitals reviewed. Constitutional:  Awake, alert, nontoxic appearance.  HENT:  Head: Atraumatic.  Eyes: Right eye exhibits no discharge. Left eye exhibits no discharge.  Neck: Neck supple.  Pulmonary/Chest: Effort normal. She exhibits no tenderness.  Abdominal: Soft. There is no tenderness. There is no rebound.  Musculoskeletal: She exhibits no tenderness.  Baseline ROM, no obvious new focal weakness. Left hand non-tender: index long, ring and small finger.   LEFT THUMB: CR less than 2 seconds except flap lac. Good extension and flexion against resistance. 2 point discrimination intact. 1  cm flap laceration near avulsion at the distal tip, barely involving the distal nail plate. 1 cm diameter flap is pale with absent CR, rest of thumb with CR<2secs  Neurological: She is alert.  Mental status and motor strength appears baseline for patient and situation.  Skin: No rash noted.  Psychiatric: She has a normal mood and affect.   LACERATION REPAIR Performed by: PA student with direct supervision by Dr. Riki Altes  Consent: Verbal consent obtained. Risks and benefits: risks, benefits and alternatives were discussed Patient identity confirmed: provided demographic data Time out performed prior to procedure Prepped and Draped in normal sterile fashion Wound explored Laceration  Location: tip of left thumb Laceration Length: 2cm No Foreign Bodies seen or palpated Anesthesia: digital block Local anesthetic: lidocaine 1%  Anesthetic total: 3 ml Irrigation method: copious water Amount of cleaning: standard Skin closure: 3 sutures 5-0 prolene  Number of sutures or staples: 3 sutures Technique: simple interrupted  Patient tolerance: Patient tolerated the procedure well with no immediate complications. After wound closure pt has slight cap refill at base of flap, still pale to most of flap.  ED Course  Procedures (including critical care time) DIAGNOSTIC STUDIES: Oxygen Saturation is 98% on RA, nl by my interpretation.    COORDINATION OF CARE: 8:54 PM-Discussed treatment plan which includes referral to hand surgery, digital block of laceration site and 3 sutures to tack down near amputation of tip of left thumb, imaging, antibiotics with pt at bedside and pt agreed to plan.  5-0 prolene  Pt aware near avulsion flap lac may not be viable and may end up with avulsion. Labs Review Labs Reviewed - No data to display  Imaging Review No results found.   EKG Interpretation None      MDM   Final diagnoses:  Thumb laceration, right, initial encounter   I doubt any other EMC precluding discharge at this time including, but not necessarily limited to the following: deep structure involvement. I personally performed the services described in this documentation, which was scribed in my presence. The recorded information has been reviewed and is accurate.     Babette Relic, MD 10/03/13 1240

## 2013-10-02 NOTE — ED Notes (Signed)
Patient cut the tip of her finger while chopping an onion. Pt does have an area at the tip of her left thumb that is cut almost through. Bleeding controlled at this time.

## 2013-10-02 NOTE — Discharge Instructions (Signed)
Finger Avulsion  When the tip of the finger is lost, a new nail may grow back if part of the fingernail is left. The new nail may be deformed. If just the tip of the finger is lost, no repair may be needed unless there is bone showing. If bone is showing, your caregiver may need to remove the protruding bone and put on a bandage. Your caregiver will do what is best for you. Most of the time when a fingertip is lost, the end will gradually grow back on and look fairly normal, but it may remain sensitive to pressure and temperature extremes for a long time. HOME CARE INSTRUCTIONS   Keep your hand elevated above your heart to relieve pain and swelling.  Keep your dressing dry and clean.  Change your bandage in 24 hours or as directed.  Only take over-the-counter or prescription medicines for pain, discomfort, or fever as directed by your caregiver.  See your caregiver as needed for problems. SEEK MEDICAL CARE IF:   You have increased pain, swelling, drainage, or bleeding.  You have a fever.  You have swelling that spreads from your finger and into your hand. Make sure to check to see if you need a tetanus booster. Document Released: 04/29/2001 Document Revised: 08/20/2011 Document Reviewed: 03/24/2008 [facility] Patient Information 2015 [place], [place]. This information is not intended to replace advice given to you by your health care provider. Make sure you discuss any questions you have with your health care provider.  A laceration is a cut or lesion that goes through all layers of the skin and into the tissue just beneath the skin. This may have been repaired by your caregiver.  SEEK MEDICAL ATTENTION IF: There is redness, swelling, increasing pain in the wound  There is a red line that goes up your arm or leg.  Pus is coming from wound.  You develop an unexplained temperature above 100.4.  You notice a foul smell coming from the wound or dressing.  There is a breaking open of the wound  (edges not staying together) after sutures have been removed. If you did not receive a tetanus shot today because you thought you were up to date, but did not recall when your last one was given, make sure to check with your primary caregiver to determine if you need one.

## 2014-03-04 HISTORY — PX: BREAST SURGERY: SHX581

## 2014-06-17 ENCOUNTER — Other Ambulatory Visit (HOSPITAL_COMMUNITY): Payer: Self-pay | Admitting: Obstetrics & Gynecology

## 2014-06-17 DIAGNOSIS — Z1231 Encounter for screening mammogram for malignant neoplasm of breast: Secondary | ICD-10-CM

## 2014-06-23 ENCOUNTER — Other Ambulatory Visit (HOSPITAL_COMMUNITY): Payer: Self-pay | Admitting: Obstetrics & Gynecology

## 2014-06-23 ENCOUNTER — Ambulatory Visit (HOSPITAL_COMMUNITY)
Admission: RE | Admit: 2014-06-23 | Discharge: 2014-06-23 | Disposition: A | Discharge status: Home / Self Care | Payer: BLUE CROSS/BLUE SHIELD | Source: Ambulatory Visit | Attending: Obstetrics & Gynecology | Admitting: Obstetrics & Gynecology

## 2014-06-23 DIAGNOSIS — Z1231 Encounter for screening mammogram for malignant neoplasm of breast: Secondary | ICD-10-CM

## 2014-06-24 ENCOUNTER — Other Ambulatory Visit: Payer: Self-pay | Admitting: Obstetrics & Gynecology

## 2014-06-24 DIAGNOSIS — R928 Other abnormal and inconclusive findings on diagnostic imaging of breast: Secondary | ICD-10-CM

## 2014-06-30 ENCOUNTER — Other Ambulatory Visit: Payer: Self-pay | Admitting: Obstetrics & Gynecology

## 2014-06-30 DIAGNOSIS — R928 Other abnormal and inconclusive findings on diagnostic imaging of breast: Secondary | ICD-10-CM

## 2014-07-03 DIAGNOSIS — Z853 Personal history of malignant neoplasm of breast: Secondary | ICD-10-CM

## 2014-07-03 HISTORY — DX: Personal history of malignant neoplasm of breast: Z85.3

## 2014-07-04 ENCOUNTER — Other Ambulatory Visit: Payer: Self-pay | Admitting: Obstetrics & Gynecology

## 2014-07-04 ENCOUNTER — Ambulatory Visit: Admission: RE | Admit: 2014-07-04 | Payer: BLUE CROSS/BLUE SHIELD | Source: Ambulatory Visit

## 2014-07-04 ENCOUNTER — Ambulatory Visit
Admission: RE | Admit: 2014-07-04 | Discharge: 2014-07-04 | Disposition: A | Discharge status: Home / Self Care | Payer: BLUE CROSS/BLUE SHIELD | Source: Ambulatory Visit | Attending: Obstetrics & Gynecology | Admitting: Obstetrics & Gynecology

## 2014-07-04 ENCOUNTER — Inpatient Hospital Stay: Admission: RE | Admit: 2014-07-04 | Payer: BLUE CROSS/BLUE SHIELD | Source: Ambulatory Visit

## 2014-07-04 DIAGNOSIS — R928 Other abnormal and inconclusive findings on diagnostic imaging of breast: Secondary | ICD-10-CM

## 2014-07-05 ENCOUNTER — Ambulatory Visit
Admission: RE | Admit: 2014-07-05 | Discharge: 2014-07-05 | Disposition: A | Discharge status: Home / Self Care | Payer: BLUE CROSS/BLUE SHIELD | Source: Ambulatory Visit | Attending: Obstetrics & Gynecology | Admitting: Obstetrics & Gynecology

## 2014-07-05 ENCOUNTER — Other Ambulatory Visit: Payer: Self-pay | Admitting: Obstetrics & Gynecology

## 2014-07-05 DIAGNOSIS — R928 Other abnormal and inconclusive findings on diagnostic imaging of breast: Secondary | ICD-10-CM

## 2014-07-05 DIAGNOSIS — C50912 Malignant neoplasm of unspecified site of left female breast: Secondary | ICD-10-CM

## 2014-07-07 ENCOUNTER — Telehealth: Payer: Self-pay | Admitting: *Deleted

## 2014-07-07 DIAGNOSIS — Z170 Estrogen receptor positive status [ER+]: Secondary | ICD-10-CM | POA: Insufficient documentation

## 2014-07-07 DIAGNOSIS — C50412 Malignant neoplasm of upper-outer quadrant of left female breast: Secondary | ICD-10-CM

## 2014-07-07 NOTE — Telephone Encounter (Signed)
Confirmed [facility] for 07/13/14 at 12N .  Instructions and contact information given.

## 2014-07-08 ENCOUNTER — Ambulatory Visit
Admission: RE | Admit: 2014-07-08 | Discharge: 2014-07-08 | Disposition: A | Discharge status: Home / Self Care | Payer: BLUE CROSS/BLUE SHIELD | Source: Ambulatory Visit | Attending: Obstetrics & Gynecology | Admitting: Obstetrics & Gynecology

## 2014-07-08 DIAGNOSIS — C50912 Malignant neoplasm of unspecified site of left female breast: Secondary | ICD-10-CM

## 2014-07-08 MED ORDER — GADOBENATE DIMEGLUMINE 529 MG/ML IV SOLN
20.0000 mL | Freq: Once | INTRAVENOUS | Status: AC | PRN
  Administered 2014-07-08: 20 mL via INTRAVENOUS

## 2014-07-13 ENCOUNTER — Ambulatory Visit: Payer: BLUE CROSS/BLUE SHIELD | Attending: General Surgery | Admitting: Physical Therapy

## 2014-07-13 ENCOUNTER — Ambulatory Visit
Admission: RE | Admit: 2014-07-13 | Discharge: 2014-07-13 | Disposition: A | Discharge status: Home / Self Care | Payer: BLUE CROSS/BLUE SHIELD | Source: Ambulatory Visit | Attending: Radiation Oncology | Admitting: Radiation Oncology

## 2014-07-13 ENCOUNTER — Ambulatory Visit: Payer: BLUE CROSS/BLUE SHIELD

## 2014-07-13 ENCOUNTER — Encounter: Payer: Self-pay | Admitting: Physical Therapy

## 2014-07-13 ENCOUNTER — Other Ambulatory Visit (HOSPITAL_BASED_OUTPATIENT_CLINIC_OR_DEPARTMENT_OTHER): Payer: BLUE CROSS/BLUE SHIELD

## 2014-07-13 ENCOUNTER — Encounter: Payer: Self-pay | Admitting: Oncology

## 2014-07-13 ENCOUNTER — Ambulatory Visit (HOSPITAL_BASED_OUTPATIENT_CLINIC_OR_DEPARTMENT_OTHER): Payer: BLUE CROSS/BLUE SHIELD | Admitting: Oncology

## 2014-07-13 DIAGNOSIS — Z170 Estrogen receptor positive status [ER+]: Secondary | ICD-10-CM | POA: Diagnosis not present

## 2014-07-13 DIAGNOSIS — C50412 Malignant neoplasm of upper-outer quadrant of left female breast: Secondary | ICD-10-CM | POA: Diagnosis not present

## 2014-07-13 LAB — CBC WITH DIFFERENTIAL/PLATELET
BASO%: 1 % (ref 0.0–2.0)
Basophils Absolute: 0.1 10*3/uL (ref 0.0–0.1)
EOS%: 2.4 % (ref 0.0–7.0)
Eosinophils Absolute: 0.2 10*3/uL (ref 0.0–0.5)
HCT: 41.4 % (ref 34.8–46.6)
HGB: 14.1 g/dL (ref 11.6–15.9)
LYMPH#: 2.3 10*3/uL (ref 0.9–3.3)
LYMPH%: 25.4 % (ref 14.0–49.7)
MCH: 29.7 pg (ref 25.1–34.0)
MCHC: 34.1 g/dL (ref 31.5–36.0)
MCV: 87.3 fL (ref 79.5–101.0)
MONO#: 0.8 10*3/uL (ref 0.1–0.9)
MONO%: 9.2 % (ref 0.0–14.0)
NEUT#: 5.6 10*3/uL (ref 1.5–6.5)
NEUT%: 62 % (ref 38.4–76.8)
Platelets: 214 10*3/uL (ref 145–400)
RBC: 4.74 10*6/uL (ref 3.70–5.45)
RDW: 12.7 % (ref 11.2–14.5)
WBC: 9 10*3/uL (ref 3.9–10.3)

## 2014-07-13 LAB — COMPREHENSIVE METABOLIC PANEL (CC13)
ALBUMIN: 3.9 g/dL (ref 3.5–5.0)
ALT: 30 U/L (ref 0–55)
AST: 21 U/L (ref 5–34)
Alkaline Phosphatase: 72 U/L (ref 40–150)
Anion Gap: 9 mEq/L (ref 3–11)
BUN: 13.3 mg/dL (ref 7.0–26.0)
CO2: 25 mEq/L (ref 22–29)
CREATININE: 0.7 mg/dL (ref 0.6–1.1)
Calcium: 9.4 mg/dL (ref 8.4–10.4)
Chloride: 106 mEq/L (ref 98–109)
EGFR: >90 mL/min/{1.73_m2} (ref >90)
GLUCOSE: 78 mg/dL (ref 70–140)
POTASSIUM: 4.2 meq/L (ref 3.5–5.1)
Sodium: 140 mEq/L (ref 136–145)
Total Bilirubin: 0.26 mg/dL (ref 0.20–1.20)
Total Protein: 6.8 g/dL (ref 6.4–8.3)

## 2014-07-13 MED ORDER — TAMOXIFEN CITRATE 20 MG PO TABS: 20.0000 mg | ORAL_TABLET | Freq: Every day | ORAL | Status: AC

## 2014-07-13 NOTE — Progress Notes (Signed)
[facility]  Telephone:(336) [phone] Fax:(336) [phone]     ID: Lori [name] DOB: 1965/12/31  MR#: [id number]  HOZ#:[id number]  Patient Care Team: Delilah Shan, MD as PCP - General (Family Medicine) [name], MD as Consulting Physician (General Surgery) [name], MD as Consulting Physician (Oncology) [name], MD as Attending Physician (Radiation Oncology) [name], RN as Registered Nurse [name], RN as Registered Nurse [name], NP as Nurse Practitioner (Nurse Practitioner) PCP: [name], MD GYN: [name] MD OTHER MD:  CHIEF COMPLAINT: Estrogen receptor positive breast cancer  CURRENT TREATMENT: Definitive surgery pending   BREAST CANCER HISTORY: [name] (who is the daughter of my former patient, [name], herself now 10 years out from her T1 cN0 invasive ductal carcinoma, treated with anti-estrogens only) went [facility] April 20 03/23/2014. Breast density was category C. A possible mass in the left breast upper outer quadrant was felt to warrant further evaluation, and on 07/04/2014 the patient underwent left mammography with tomosynthesis and left breast ultrasonography. At this point the patient felt she was able to palpate a mass in the area in question. Tomosynthesis did reveal an irregular mass in the upper outer left breast measuring 2.3 cm, associated on physical exam with a large area of thickening. Ultrasound of the area in question confirmed an irregular mass at the 1:00 position 5 cm from the nipple measuring 2.2 cm. The left axilla was negative sonographically.  Biopsy of the mass in question the same day, 07/04/2014, showed (SAA [phone]) an invasive lobular breast cancer which was estrogen receptor 50% positive with strong staining intensity, progesterone receptor 1% positive with moderate staining intensity, with a proliferation marker of 1%, and no HER-2  amplification, the signals ratio being 1.19 and the number per cell 1.55.  On 07/08/2014 the patient underwent bilateral breast MRI. This showed no abnormal adenopathy and no abnormality in the right breast. In the left breast however there was a 9.3 area of abnormal enhancement involving all quadrants. Within the upper outer quadrant there was a denser area which measured up to 5.1 cm.  The patient's subsequent history is as detailed below  INTERVAL HISTORY: [name] was evaluated in the multidisciplinary breast cancer clinic 07/13/2014 accompanied by her husband [name]. Her case was also presented that's an morning at the multidisciplinary breast cancer conference. At that point a preliminary plan was proposed namely for genetics testing, left mastectomy and sentinel lymph node sampling, and consideration of an Oncotype depending on the pathology results, with radiation to follow and then antiestrogen's.  REVIEW OF SYSTEMS: Aside from the mass itself, there were no specific symptoms leading to the original mammogram, which was routinely scheduled. The patient denies unusual headaches, visual changes, nausea, vomiting, stiff neck, dizziness, or gait imbalance. There has been no cough, phlegm production, or pleurisy, no chest pain or pressure, and no change in bowel or bladder habits. The patient denies fever, rash, bleeding, unexplained fatigue or unexplained weight loss. [name] does not exercise regularly. She occasionally has mild arthritis pains here in there, but these are not more intense or persistent than usual. A detailed review of systems was otherwise entirely negative.    PAST MEDICAL HISTORY: Past Medical History  Diagnosis Date  . Asthma     last trimester of pregnancy 3 yrs ago-  never had problems since then, no meds  . Breast cancer     PAST SURGICAL HISTORY: Past  Surgical History  Procedure Laterality Date  . Cesarean section      x 2  . Knee arthroscopy Left 07/27/2013     Procedure: LEFT ARTHROSCOPY KNEE WITH MEDIAL MENISCAL DEBRIDEMENT;  Surgeon: Frank Aluisio V, MD;  Location: [facility];  Service: Orthopedics;  Laterality: Left;    FAMILY HISTORY Family History  Problem Relation Age of Onset  . Breast cancer Mother   . Lymphoma Maternal Aunt   . Uterine cancer Paternal Aunt   . Uterine cancer Paternal Grandmother   . Ovarian cancer Other   The patient's father died with congestive heart failure at the age of 66. The patient's mother is living, age 74. She was diagnosed with breast cancer at age 64. The patient's paternal grandmother was diagnosed with uterine cancer at age 77 and her daughter, the patient's paternal aunt, also with uterine cancer at age 58. On the mother's side one maternal aunt was diagnosed with lymphoma and the other with throat cancer. A maternal great aunt at age 78 was diagnosed with ovarian cancer.   GYNECOLOGIC HISTORY:  Patient's last menstrual period was 06/16/2014.  menarche age 14, first live birth age 35, which the patient is aware double as the risk of breast cancer. She is GX P2. She still having regular periods. 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 Her husband Sam B Wierman the third (goes by "Trey") works in IT, although currently he is looking for work. Their children are Rachel 12 and [name] 3. The patient is not a church attender    ADVANCED DIRECTIVES: Not in place   HEALTH MAINTENANCE: History  Substance Use Topics  . Smoking status: Former Smoker -- 1.00 packs/day for 8 years    Types: Cigarettes    Quit date: 07/20/2000  . Smokeless tobacco: Never Used  . Alcohol Use: Yes     Comment: socially     Colonoscopy:  PAP:  Bone density:  Lipid panel:  No Known Allergies  Current Outpatient Prescriptions  Medication Sig Dispense Refill  . ibuprofen (ADVIL,MOTRIN) 200 MG tablet Take 200 mg by mouth every 6 (six) hours as needed.      . tamoxifen (NOLVADEX) 20 MG tablet Take 1 tablet (20 mg total) by mouth daily. 90 tablet 12   No current facility-administered medications for this visit.    OBJECTIVE: There were no vitals filed for this visit.   There is no weight on file to calculate BMI.    ECOG FS:0 - Asymptomatic Vitals - 1 value per visit 10/02/2013  SYSTOLIC 123  DIASTOLIC 67  Pulse 67  Temperature 98.1  Respirations 18  Weight (lb) 191  Height 5' 7"  BMI 29.91   Ocular: Sclerae unicteric, pupils equal, round and reactive to light Ear-nose-throat: Oropharynx clear, dentition fair Lymphatic: No cervical or supraclavicular adenopathy Lungs no rales or rhonchi, good excursion bilaterally Heart regular rate and rhythm, no murmur appreciated Abd soft, nontender, positive bowel sounds MSK no focal spinal tenderness, no joint edema Neuro: non-focal, well-oriented, appropriate affect Breasts: The right breast is unremarkable. The left breast is status post recent biopsy. There is a small ecchymosis at the biopsy site. There is a subjacent mass which may be related to the recent biopsy or to the original mass itself. There are no other skin or nipple changes of concern. The left axilla is benign   LAB RESULTS:  CMP     Component Value   Date/Time   NA 140 07/13/2014 1226   K 4.2 07/13/2014 1226   CO2 25 07/13/2014 1226   GLUCOSE 78 07/13/2014 1226   BUN 13.3 07/13/2014 1226   CREATININE 0.7 07/13/2014 1226   CALCIUM 9.4 07/13/2014 1226   PROT 6.8 07/13/2014 1226   ALBUMIN 3.9 07/13/2014 1226   AST 21 07/13/2014 1226   ALT 30 07/13/2014 1226   ALKPHOS 72 07/13/2014 1226   BILITOT 0.26 07/13/2014 1226    INo results found for: SPEP, UPEP  Lab Results  Component Value Date   WBC 9.0 07/13/2014   NEUTROABS 5.6 07/13/2014   HGB 14.1 07/13/2014   HCT 41.4 07/13/2014   MCV 87.3 07/13/2014   PLT 214 07/13/2014      Chemistry      Component Value Date/Time   NA 140 07/13/2014 1226   K 4.2  07/13/2014 1226   CO2 25 07/13/2014 1226   BUN 13.3 07/13/2014 1226   CREATININE 0.7 07/13/2014 1226      Component Value Date/Time   CALCIUM 9.4 07/13/2014 1226   ALKPHOS 72 07/13/2014 1226   AST 21 07/13/2014 1226   ALT 30 07/13/2014 1226   BILITOT 0.26 07/13/2014 1226       No results found for: LABCA2  No components found for: LABCA125  No results for input(s): INR in the last 168 hours.  Urinalysis No results found for: COLORURINE, APPEARANCEUR, LABSPEC, PHURINE, GLUCOSEU, HGBUR, BILIRUBINUR, KETONESUR, PROTEINUR, UROBILINOGEN, NITRITE, LEUKOCYTESUR  STUDIES: Mr Breast Bilateral W Wo Contrast  07/08/2014   CLINICAL DATA:  Recent ultrasound-guided core biopsy of mass in the upper-outer quadrant of the left breast demonstrates invasive and in situ mammary carcinoma.  LABS:  Not applicable  EXAM: BILATERAL BREAST MRI WITH AND WITHOUT CONTRAST  TECHNIQUE: Multiplanar, multisequence MR images of both breasts were obtained prior to and following the intravenous administration of 20 ml of MultiHance.  THREE-DIMENSIONAL MR IMAGE RENDERING ON INDEPENDENT WORKSTATION:  Three-dimensional MR images were rendered by post-processing of the original MR data on an independent workstation. The three-dimensional MR images were interpreted, and findings are reported in the following complete MRI report for this study. Three dimensional images were evaluated at the independent DynaCad workstation  COMPARISON:  Mammogram and ultrasound from [facility] 07/04/2014 and earlier  FINDINGS: Breast composition: c. Heterogeneous fibroglandular tissue.  Background parenchymal enhancement: Minimal  Right breast: No mass or abnormal enhancement.  Left breast: Throughout all quadrants of the left breast there is abnormal enhancement which measures 9.3 x 8.5 x 7.8 cm. Within the upper-outer quadrant of the left breast there is a tissue marker clip following recent ultrasound-guided core  biopsy. In this region the most worrisome enhancement curves are identified, showing rapid wash-in and washout type kinetics. Additionally the enhancement appears confluent and masslike in the upper-outer quadrant, measuring approximately 4.0 x 5.1 cm. Elsewhere within the breast, primarily medium wash-in and persistent type enhancement kinetics are identified in diffuse, stippled non mass enhancement.  Lymph nodes: No abnormal appearing lymph nodes.  Ancillary findings:  None.  IMPRESSION: 1. Large area of abnormal enhancement throughout the left breast. The more masslike component is seen in the upper-outer quadrant, associated with a biopsy clip and showing the most worrisome enhancement. 2. Additional non mass stippled enhancement is identified throughout the other quadrants of the left breast suspicious for in situ disease. Additional biopsy can be performed to document extent of disease as needed if the patient is a candidate for   breast conservation.  RECOMMENDATION: 1. Consider MR guided core biopsy of additional enhancement in the left breast to document extent of disease as needed. 2. Right breast is negative. 3. No adenopathy identified.  BI-RADS CATEGORY  4: Suspicious.   Electronically Signed   By: Nolon Nations M.D.   On: 07/08/2014 16:57   Mm Digital Diagnostic Unilat L  07/04/2014   CLINICAL DATA:  Post biopsy of a highly suspicious mass in the upper-outer left breast.  EXAM: DIAGNOSTIC LEFT MAMMOGRAM POST ULTRASOUND BIOPSY  COMPARISON:  Previous exam(s).  FINDINGS: Mammographic images were obtained following ultrasound guided biopsy of the suspicious mass in the upper-outer left breast at 1-2 o'clock. A ribbon shaped biopsy marking clip is present in the targeted region of the left breast mass.  IMPRESSION: Appropriate positioning of ribbon shaped biopsy marking clip post ultrasound-guided biopsy of a highly suspicious mass in the upper-outer left breast at 1-2 o'clock.  Final Assessment: Post  Procedure Mammograms for Marker Placement   Electronically Signed   By: Everlean Alstrom M.D.   On: 07/04/2014 11:58   Mm Radiologist Eval And Mgmt  07/05/2014   EXAM: ESTABLISHED PATIENT OFFICE VISIT - LEVEL II  CHIEF COMPLAINT: Status post ultrasound-guided core needle biopsy of a 2.2 cm mass in the 1 o'clock position of the left breast.  Current Pain Level: 4 out of 10  HISTORY OF PRESENT ILLNESS: Architectural distortion at mammography containing a 2.2 cm mass at ultrasound in the upper-outer quadrant of the left breast with imaging features suspicious for malignancy. The mass was biopsied under ultrasound guidance yesterday.  EXAM: The patient has palpable soft tissue thickening in the upper outer left breast which she reports is unchanged compared to thickening that she was feeling prior to biopsy. There is no bruising or palpable hematoma at this time.  PATHOLOGY: Invasive and in situ mammary carcinoma with a lobular phenotype, grade 2.  ASSESSMENT AND PLAN: ASSESSMENT AND PLAN The pathological diagnosis is concordant with the imaging findings. This was discussed with the patient and her husband. Their questions were answered.  The patient was given an appointment for a bilateral breast MR without and with contrast at 2 p.m. on 07/08/2014 at [facility]. She will be contacted by the Multidisciplinary Clinic for a clinic appointment. She was given clinic materials and Scientist, clinical (histocompatibility and immunogenetics).   Electronically Signed   By: Claudie Revering M.D.   On: 07/05/2014 16:15   US Breast Ltd Uni Left Inc Axilla  07/04/2014   CLINICAL DATA:  Screening recall for a possible mass in the outer left breast. The patient feels an area of thickening in the outer left breast.  EXAM: DIGITAL DIAGNOSTIC LEFT MAMMOGRAM WITH 3D TOMOSYNTHESIS AND CAD  LEFT BREAST ULTRASOUND  COMPARISON:  Previous exams.  ACR Breast Density Category c: The breast tissue is heterogeneously dense, which may obscure small  masses.  FINDINGS: Spot compression CC and MLO tomosynthesis was performed over the upper-outer left breast demonstrating an irregular mass with associated distortion measuring approximately 2.3 cm.  Mammographic images were processed with CAD.  Physical examination of the outer left breast reveals a large area of thickening at the approximate 1 to 2 o'clock position.  Targeted ultrasound of the left breast was performed demonstrating an irregular shadowing mass at 1 o'clock 5 cm from the nipple measuring 1.5 x 1.2 x 2.2 cm. The area of palpable concern feels larger than would is seen sonographically.  No lymphadenopathy seen in the  left axilla.  IMPRESSION: Highly suspicious left breast mass.  RECOMMENDATION: Ultrasound-guided biopsy of the highly suspicious mass in the upper-outer left breast is recommended. This will subsequently be performed and dictated separately.  I have discussed the findings and recommendations with the patient. Results were also provided in writing at the conclusion of the visit. If applicable, a reminder letter will be sent to the patient regarding the next appointment.  BI-RADS CATEGORY  5: Highly suggestive of malignancy.   Electronically Signed   By: Jennifer  Jarosz M.D.   On: 07/04/2014 11:55   Mm Diag Breast Tomo Uni Left  07/04/2014   CLINICAL DATA:  Screening recall for a possible mass in the outer left breast. The patient feels an area of thickening in the outer left breast.  EXAM: DIGITAL DIAGNOSTIC LEFT MAMMOGRAM WITH 3D TOMOSYNTHESIS AND CAD  LEFT BREAST ULTRASOUND  COMPARISON:  Previous exams.  ACR Breast Density Category c: The breast tissue is heterogeneously dense, which may obscure small masses.  FINDINGS: Spot compression CC and MLO tomosynthesis was performed over the upper-outer left breast demonstrating an irregular mass with associated distortion measuring approximately 2.3 cm.  Mammographic images were processed with CAD.  Physical examination of the outer left  breast reveals a large area of thickening at the approximate 1 to 2 o'clock position.  Targeted ultrasound of the left breast was performed demonstrating an irregular shadowing mass at 1 o'clock 5 cm from the nipple measuring 1.5 x 1.2 x 2.2 cm. The area of palpable concern feels larger than would is seen sonographically.  No lymphadenopathy seen in the left axilla.  IMPRESSION: Highly suspicious left breast mass.  RECOMMENDATION: Ultrasound-guided biopsy of the highly suspicious mass in the upper-outer left breast is recommended. This will subsequently be performed and dictated separately.  I have discussed the findings and recommendations with the patient. Results were also provided in writing at the conclusion of the visit. If applicable, a reminder letter will be sent to the patient regarding the next appointment.  BI-RADS CATEGORY  5: Highly suggestive of malignancy.   Electronically Signed   By: Jennifer  Jarosz M.D.   On: 07/04/2014 11:55   Mm Screening Breast Tomo Bilateral  06/23/2014   CLINICAL DATA:  Screening. Patient thinks she feels a thickening in the lateral side of the breast.  EXAM: DIGITAL SCREENING BILATERAL MAMMOGRAM WITH 3D TOMO WITH CAD  COMPARISON:  Previous exam(s).  ACR Breast Density Category c: The breast tissue is heterogeneously dense, which may obscure small masses.  FINDINGS: In the left breast, a possible mass warrants further evaluation. There is developing density in the upper-outer quadrant of the left breast. In the right breast, no findings suspicious for malignancy.  Images were processed with CAD.  IMPRESSION: Further evaluation is suggested for possible mass in the left breast.  RECOMMENDATION: Diagnostic mammogram and possibly ultrasound of the left breast. (Code:[id number]-00M)  The patient will be contacted regarding the findings, and additional imaging will be scheduled.  BI-RADS CATEGORY  0: Incomplete. Need additional imaging evaluation and/or prior mammograms for  comparison.   Electronically Signed   By: Elizabeth  Brown M.D.   On: 06/23/2014 13:37   Us Lt Breast Bx W Loc Dev 1st Lesion Img Bx Spec Us Guide  07/04/2014   CLINICAL DATA:  48-year-old female with a highly suspicious mass in the upper-outer left breast at 1-2 o'clock.  EXAM: ULTRASOUND GUIDED LEFT BREAST CORE NEEDLE BIOPSY  COMPARISON:  Previous exam(s).  PROCEDURE: I met with the   patient and we discussed the procedure of ultrasound-guided biopsy, including benefits and alternatives. We discussed the high likelihood of a successful procedure. We discussed the risks of the procedure including infection, bleeding, tissue injury, clip migration, and inadequate sampling. Informed written consent was given. The usual time-out protocol was performed immediately prior to the procedure.  Using sterile technique and 2% Lidocaine as local anesthetic, under direct ultrasound visualization, a 12 gauge vacuum-assisted device was used to perform biopsy of the mass in the upper-outer left breast at 1-2 o'clockusing a lateral approach. At the conclusion of the procedure, a ribbon shaped tissue marker clip was deployed into the biopsy cavity. Follow-up 2-view mammogram was performed and dictated separately.  IMPRESSION: Ultrasound-guided biopsy of the highly suspicious mass in the upper-outer left breast at 1 o'clock. No apparent complications.   Electronically Signed   By: Everlean Alstrom M.D.   On: 07/04/2014 11:56    ASSESSMENT: 49 y.o. [place] woman status post left breast biopsy 07/04/2014 for a clinical T3 N0, stage IIA invasive lobular breast cancer, grade 2, estrogen receptor positive, progesterone receptor 1% "positive", with an MIB-1 of less than 5% and no HER-2 amplification  (1) genetics testing pending  (2) surgery will consist of left mastectomy with sentinel lymph node sampling, with contralateral mastectomy depending on genetics results  (3) Oncotype will be obtained from the definitive surgical  specimen to help settle the question regarding chemotherapy  (4) radiation to follow chemotherapy as appropriate  (5) tamoxifen was started neoadjuvantly on 07/13/2014 given the likely delay in definitive surgery while genetics results are pending   PLAN: We spent the better part of today's hour-long appointment discussing the biology of breast cancer in general, and the specifics of the patient's tumor in particular. Remaining understands that lobular breast cancers are harder to detect then ductal. They frequently spread to lymph nodes even without apparent spread by radiology. It is also very difficult in her case to differentiate the more dense mass, which appears to be 4 cm or so, from the entire area of abnormality in the right breast, which is greater than 9 cm.  She understands that she will need a right mastectomy and sentinel lymph node sampling. If she proves to carry a deleterious mutation, she may also want to remove the left breast. Of course she would in that case also need to do bilateral salpingo-oophorectomy (which she would prefer to have done all at one procedure if possible).  She understands the genetic test will take at least 3 weeks. Because of the delay where going to be starting her on tamoxifen now. We did discuss the possible toxicities, side effects and complications of that agent and she will call us with any problems that she may develop from it. In particular she understands that it may change her periods or even cause her to have no periods, but that it is not a contraceptive.  As far as systemic therapy is concerned, if this tumor is node negative, we will request an Oncotype. That will help [place] make the chemotherapy decision. If the tumor is low risk, she would not receive chemotherapy. If it is intermediate or high risk, that I would recommend chemotherapy in this young woman. She understands that if the lymph nodes are involved we would not send an Oncotype would  proceed to chemotherapy  I'm making her a return appointment with me in approximately 6 weeks. I calculated that we'll be about 2 weeks out from her surgery so we  should have the Oncotype results by then. We also discussed the fact that she will need to be an anti-estrogens the minimum of 5 and more likely 10 years  The patient has a good understanding of the overall plan. She agrees with it. She knows the goal of treatment in her case is cure. She will call with any problems that may develop before her next visit here.  [name], MD   07/13/2014 4:45 PM Medical Oncology and Hematology [facility] [address] [place], [place] [address] Tel. [phone]    Fax. [phone]

## 2014-07-13 NOTE — Progress Notes (Signed)
[name] is a very pleasant 49 y.o. female from [place], [place] with newly diagnosed invasive mammary carcinoma and MCIS, both favoring a lobular phenotype, of the left breast.  Biopsy results revealed the tumor's prognostic profile is ER positive, PR positive, and HER2/neu negative. Ki67 is < 5%.  She presents today with her husband to the [facility]) for treatment consideration and recommendations from the breast surgeon, radiation oncologist, and medical oncologist.     I briefly met with [name] and her husband during her [facility] visit today. We discussed the purpose of the Survivorship Clinic, which will include monitoring for recurrence, coordinating completion of age and gender-appropriate cancer screenings, promotion of overall wellness, as well as managing potential late/long-term side effects of anti-cancer treatments.    The treatment plan for [name] will likely include neoadjuvant anti-estrogen therapy, surgery, radiation therapy, and adjuvant anti-estrogen therapy.  She will meet with the Genetics Counselor due to her family history of breast cancer and now her personal diagnosis of breast cancer. As of today, the intent of treatment for [name] is cure, therefore she will be eligible for the Survivorship Clinic upon her completion of treatment.  Her survivorship care plan (SCP) document will be drafted and updated throughout the course of her treatment trajectory. She will receive the SCP in an office visit with myself in the Survivorship Clinic once she has completed treatment.   [name] was encouraged to ask questions and all questions were answered to her satisfaction.  She was given my business card and encouraged to contact me with any concerns regarding survivorship.  I look forward to participating in her care.   Mike Craze, NP [place] [phone]

## 2014-07-13 NOTE — Therapy (Signed)
[place], [place], [address] Phone: [phone]   Fax:  [phone]  Physical Therapy Evaluation  Patient Details  Name: Lori [name] MRN: [id number] Date of Birth: 10-14-1965 Referring Provider:  Jovita Kussmaul, MD  Encounter Date: 07/13/2014      PT End of Session - 07/13/14 1646    Visit Number 1   Number of Visits 1   PT Start Time 1415   PT Stop Time 8338  Also saw pt from 1450-1508   PT Time Calculation (min) 8 min   Activity Tolerance Patient tolerated treatment well   Behavior During Therapy [facility] for tasks assessed/performed      Past Medical History  Diagnosis Date  . Asthma     last trimester of pregnancy 3 yrs ago-  never had problems since then, no meds  . Breast cancer     Past Surgical History  Procedure Laterality Date  . Cesarean section      x 2  . Knee arthroscopy Left 07/27/2013    Procedure: LEFT ARTHROSCOPY KNEE WITH MEDIAL MENISCAL DEBRIDEMENT;  Surgeon: Gearlean Alf, MD;  Location: [facility];  Service: Orthopedics;  Laterality: Left;    There were no vitals filed for this visit.  Visit Diagnosis:  Carcinoma of upper-outer quadrant of left female breast - Plan: PT plan of care cert/re-cert      Subjective Assessment - 07/13/14 1635    Subjective Patient was seen today for a baseline assessment on her newly diagnosed left breast cancer.   Patient is accompained by: Family member   Pertinent History She was diagnosed 07/05/14 with left invasive lobular cancer that is ER/PR positive and HER2 negative.  It has a Ki67 of 1% and measures 2.2 cm on ultrasound but 9.3 cm total enhancement on MRI.   Patient Stated Goals Reduce lymphedema risk and learn post op shoulder ROM HEP.   Currently in Pain? Yes   Pain Score 7    Pain Location Knee   Pain Orientation Left   Pain Descriptors / Indicators Sharp   Pain Type Chronic pain  Had left knee scope for OA in 5/15.   Pain  Onset More than a month ago   Pain Frequency Intermittent   Aggravating Factors  Walking   Pain Relieving Factors resting / non-weightbearing activities   Effect of Pain on Daily Activities limits weightbearing activities.   Multiple Pain Sites No            [facility] PT Assessment - 07/13/14 0001    Assessment   Medical Diagnosis Left breast cancer   Onset Date 07/05/14   Precautions   Precautions Other (comment)  Active breast cancer   Restrictions   Weight Bearing Restrictions No   Balance Screen   Has the patient fallen in the past 6 months No   Has the patient had a decrease in activity level because of a fear of falling?  No   Is the patient reluctant to leave their home because of a fear of falling?  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[employer]; works from home on computer   Leisure She does not exercise  Cognition   Overall Cognitive Status Within Functional Limits for tasks assessed   Posture/Postural Control   Posture/Postural Control No significant limitations   ROM / Strength   AROM / PROM / Strength AROM;Strength   AROM   AROM Assessment Site Shoulder   Right/Left Shoulder Right;Left   Right Shoulder Extension 63 Degrees   Right Shoulder Flexion 163 Degrees   Right Shoulder ABduction 165 Degrees   Right Shoulder Internal Rotation 67 Degrees   Right Shoulder External Rotation 88 Degrees   Left Shoulder Extension 60 Degrees   Left Shoulder Flexion 160 Degrees   Left Shoulder ABduction 162 Degrees   Left Shoulder Internal Rotation 62 Degrees   Left Shoulder External Rotation 90 Degrees   Strength   Overall Strength Within functional limits for tasks  performed           LYMPHEDEMA/ONCOLOGY QUESTIONNAIRE - 07/13/14 1642    Type   Cancer Type Left breast cancer   Lymphedema Assessments   Lymphedema Assessments Upper extremities   Right Upper Extremity Lymphedema   10 cm Proximal to Olecranon Process 33.9 cm   Olecranon Process 25.7 cm   10 cm Proximal to Ulnar Styloid Process 23.4 cm   Just Proximal to Ulnar Styloid Process 16.3 cm   Across Hand at [employer] 19.4 cm   At [place] of 2nd Digit 6.2 cm   Left Upper Extremity Lymphedema   10 cm Proximal to Olecranon Process 33 cm   Olecranon Process 25 cm   10 cm Proximal to Ulnar Styloid Process 22.7 cm   Just Proximal to Ulnar Styloid Process 15.4 cm   Across Hand at [employer] 18.8 cm   At [place] of 2nd Digit 6 cm       Patient was instructed today in a home exercise program today for post op shoulder range of motion. These included active assist shoulder flexion in sitting, scapular retraction, wall walking with shoulder abduction, and hands behind head external rotation.  She was encouraged to do these twice a day, holding 3 seconds and repeating 5 times when permitted by her physician.         PT Education - 07/13/14 1645    Education provided Yes   Education Details Lymphedema risk reduction and post op shoulder ROM HEP   Person(s) Educated Patient   Methods Explanation;Demonstration;Handout   Comprehension Verbalized understanding;Returned demonstration              Breast Clinic Goals - 07/13/14 1648    Patient will be able to verbalize understanding of pertinent lymphedema risk reduction practices relevant to her diagnosis specifically related to skin care.   Time 1   Period Days   Status Achieved   Patient will be able to return demonstrate and/or verbalize understanding of the post-op home exercise program related to regaining shoulder range of motion.   Time 1   Period Days   Status Achieved   Patient will be able to verbalize understanding  of the importance of attending the postoperative After Breast Cancer Class for further lymphedema risk reduction education and therapeutic exercise.   Time 1   Period Days   Status Achieved              Plan - 07/13/14 1646    Clinical Impression Statement She was diagnosed 07/05/14 with left invasive lobular cancer that is ER/PR positive and HER2 negative.  It has a Ki67 of 1% and measures 2.2 cm on ultrasound but 9.3  cm total enhancement on MRI.  She is planning to have genetic testing.  Depending on those results, she will have either a left mastectomy with a sentinel node biopsy or a bilateral mastectomy.  She will have Oncotype testing and anti-estrogen therapy.  She will benefit from post op PT to regain shoulder ROM and strength and reduce lymphedema risk.   Pt will benefit from skilled therapeutic intervention in order to improve on the following deficits Decreased range of motion;Increased edema;Pain;Impaired UE functional use;Decreased knowledge of precautions;Decreased strength   Rehab Potential Excellent   Clinical Impairments Affecting Rehab Potential none   PT Frequency One time visit   PT Treatment/Interventions Patient/family education;Therapeutic exercise   Consulted and Agree with Plan of Care Patient;Family member/caregiver   Family Member Consulted Husband     Patient will follow up at outpatient cancer rehab if needed following surgery.  If the patient requires physical therapy at that time, a specific plan will be dictated and sent to the referring physician for approval. The patient was educated today on appropriate basic range of motion exercises to begin post operatively and the importance of attending the After Breast Cancer class following surgery.  Patient was educated today on lymphedema risk reduction practices as it pertains to recommendations that will benefit the patient immediately following surgery.  She verbalized good understanding.  No additional physical  therapy is indicated at this time.       Problem List Patient Active Problem List   Diagnosis Date Noted  . Breast cancer of upper-outer quadrant of left female breast 07/07/2014  . Acute medial meniscal tear 07/27/2013    Baylon Santelli,MARTI COOPER, PT 07/13/2014, 4:50 PM  [place], [place], [address] Phone: [phone]   Fax:  [phone]

## 2014-07-13 NOTE — Progress Notes (Signed)
Radiation Oncology         [phone] ________________________________  Initial outpatient Consultation  Name: Lori [name] MRN: [id number]  Date: 07/13/2014  DOB: [birth year]  YB:RKVTX,LEZ, MD  Jovita Kussmaul, MD   REFERRING PHYSICIAN: Jovita Kussmaul, MD  DIAGNOSIS:    ICD-9-CM ICD-10-CM   1. Breast cancer of upper-outer quadrant of left female breast 174.4 C50.412     T3N0M0 Stage IIB  Left breast invasive mammary carcinoma, likely lobular, ER 50% positive, PR weakly positive, and HER-2 negative, Grade 2   HISTORY OF PRESENT ILLNESS::Lori [name] is a 49 y.o. female who presented with thickening along the left lateral breast at the time of screening mammography. A mass was appreciated in the 1 o'clock position of the left breast, 2.2 cm on ultrasound. On MRI, there was 5.1 cm of mass-like enhancement in the left breast, as well as suspicious enhancement in all quadrants of the left breast. The biopsy of her mass revealed invasive mammary carcinoma, likely lobular. This is ER positive, PR weakly positive, and HER-2 negative, Grade 2.   She was able to feel a lump in her breast. She felt the thickness in the left breast for a couple months.   She is a Government social research officer at [employer].     PAST MEDICAL HISTORY:  has a past medical history of Asthma and Breast cancer.    PAST SURGICAL HISTORY: Past Surgical History  Procedure Laterality Date  . Cesarean section      x 2  . Knee arthroscopy Left 07/27/2013    Procedure: LEFT ARTHROSCOPY KNEE WITH MEDIAL MENISCAL DEBRIDEMENT;  Surgeon: Gearlean Alf, MD;  Location: [facility];  Service: Orthopedics;  Laterality: Left;    FAMILY HISTORY: family history includes Breast cancer in her mother; Lymphoma in her maternal aunt; Ovarian cancer in her other; Uterine cancer in her paternal aunt and paternal grandmother.  SOCIAL HISTORY:  reports that she quit smoking about 13 years ago. Her smoking use included Cigarettes. She has a 8  pack-year smoking history. She has never used smokeless tobacco. She reports that she drinks alcohol. She reports that she does not use illicit drugs.  ALLERGIES: Review of patient's allergies indicates no known allergies.  MEDICATIONS:  Current Outpatient Prescriptions  Medication Sig Dispense Refill  . ibuprofen (ADVIL,MOTRIN) 200 MG tablet Take 200 mg by mouth every 6 (six) hours as needed.    . tamoxifen (NOLVADEX) 20 MG tablet Take 1 tablet (20 mg total) by mouth daily. 90 tablet 12   No current facility-administered medications for this encounter.    REVIEW OF SYSTEMS:  Notable for that above.   PHYSICAL EXAM:  vitals were not taken for this visit.  General: Alert and oriented, in no acute distress HEENT: Head is normocephalic. Extraocular movements are intact. Oropharynx is clear. Neck: Neck is supple, no palpable cervical or supraclavicular lymphadenopathy. Heart: Regular in rate and rhythm with no murmurs, rubs, or gallops. Chest: Clear to auscultation bilaterally, with no rhonchi, wheezes, or rales. Abdomen: Soft, nontender, nondistended, with no rigidity or guarding. Extremities: No cyanosis or edema. Lymphatics: see Neck Exam Skin: No concerning lesions. Musculoskeletal: symmetric strength and muscle tone throughout. Neurologic: Cranial nerves II through XII are grossly intact. No obvious focalities. Speech is fluent. Coordination is intact. Psychiatric: Judgment and insight are intact. Affect is appropriate. Breast: Post biopsy bruising in the upper outer portion of the left breast. 5-6 cm mass (post biopsy firmness) in the UOQ left breast. Did  not appreciate anything in the left axilla. Felt no lumps in the right breast, no lymph node adenopathy.    ECOG = 0  0 - Asymptomatic (Fully active, able to carry on all predisease activities without restriction)  1 - Symptomatic but completely ambulatory (Restricted in physically strenuous activity but ambulatory and able to  carry out work of a light or sedentary nature. For example, light housework, office work)  2 - Symptomatic, <50% in bed during the day (Ambulatory and capable of all self care but unable to carry out any work activities. Up and about more than 50% of waking hours)  3 - Symptomatic, >50% in bed, but not bedbound (Capable of only limited self-care, confined to bed or chair 50% or more of waking hours)  4 - Bedbound (Completely disabled. Cannot carry on any self-care. Totally confined to bed or chair)  5 - Death   Eustace Pen MM, Creech RH, Tormey DC, et al. [phone]). "Toxicity and response criteria of the [facility]". [place] Oncol. 5 (6): 649-55   LABORATORY DATA:  Lab Results  Component Value Date   WBC 9.0 07/13/2014   HGB 14.1 07/13/2014   HCT 41.4 07/13/2014   MCV 87.3 07/13/2014   PLT 214 07/13/2014   CMP     Component Value Date/Time   NA 140 07/13/2014 1226   K 4.2 07/13/2014 1226   CO2 25 07/13/2014 1226   GLUCOSE 78 07/13/2014 1226   BUN 13.3 07/13/2014 1226   CREATININE 0.7 07/13/2014 1226   CALCIUM 9.4 07/13/2014 1226   PROT 6.8 07/13/2014 1226   ALBUMIN 3.9 07/13/2014 1226   AST 21 07/13/2014 1226   ALT 30 07/13/2014 1226   ALKPHOS 72 07/13/2014 1226   BILITOT 0.26 07/13/2014 1226         RADIOGRAPHY: Mr Breast Bilateral W Wo Contrast  07/08/2014   CLINICAL DATA:  Recent ultrasound-guided core biopsy of mass in the upper-outer quadrant of the left breast demonstrates invasive and in situ mammary carcinoma.  LABS:  Not applicable  EXAM: BILATERAL BREAST MRI WITH AND WITHOUT CONTRAST  TECHNIQUE: Multiplanar, multisequence MR images of both breasts were obtained prior to and following the intravenous administration of 20 ml of MultiHance.  THREE-DIMENSIONAL MR IMAGE RENDERING ON INDEPENDENT WORKSTATION:  Three-dimensional MR images were rendered by post-processing of the original MR data on an independent workstation. The three-dimensional MR  images were interpreted, and findings are reported in the following complete MRI report for this study. Three dimensional images were evaluated at the independent DynaCad workstation  COMPARISON:  Mammogram and ultrasound from the [facility] 07/04/2014 and earlier  FINDINGS: Breast composition: c. Heterogeneous fibroglandular tissue.  Background parenchymal enhancement: Minimal  Right breast: No mass or abnormal enhancement.  Left breast: Throughout all quadrants of the left breast there is abnormal enhancement which measures 9.3 x 8.5 x 7.8 cm. Within the upper-outer quadrant of the left breast there is a tissue marker clip following recent ultrasound-guided core biopsy. In this region the most worrisome enhancement curves are identified, showing rapid wash-in and washout type kinetics. Additionally the enhancement appears confluent and masslike in the upper-outer quadrant, measuring approximately 4.0 x 5.1 cm. Elsewhere within the breast, primarily medium wash-in and persistent type enhancement kinetics are identified in diffuse, stippled non mass enhancement.  Lymph nodes: No abnormal appearing lymph nodes.  Ancillary findings:  None.  IMPRESSION: 1. Large area of abnormal enhancement throughout the left breast. The more masslike  component is seen in the upper-outer quadrant, associated with a biopsy clip and showing the most worrisome enhancement. 2. Additional non mass stippled enhancement is identified throughout the other quadrants of the left breast suspicious for in situ disease. Additional biopsy can be performed to document extent of disease as needed if the patient is a candidate for breast conservation.  RECOMMENDATION: 1. Consider MR guided core biopsy of additional enhancement in the left breast to document extent of disease as needed. 2. Right breast is negative. 3. No adenopathy identified.  BI-RADS CATEGORY  4: Suspicious.   Electronically Signed   By: Nolon Nations M.D.    On: 07/08/2014 16:57   Mm Digital Diagnostic Unilat L  07/04/2014   CLINICAL DATA:  Post biopsy of a highly suspicious mass in the upper-outer left breast.  EXAM: DIAGNOSTIC LEFT MAMMOGRAM POST ULTRASOUND BIOPSY  COMPARISON:  Previous exam(s).  FINDINGS: Mammographic images were obtained following ultrasound guided biopsy of the suspicious mass in the upper-outer left breast at 1-2 o'clock. A ribbon shaped biopsy marking clip is present in the targeted region of the left breast mass.  IMPRESSION: Appropriate positioning of ribbon shaped biopsy marking clip post ultrasound-guided biopsy of a highly suspicious mass in the upper-outer left breast at 1-2 o'clock.  Final Assessment: Post Procedure Mammograms for Marker Placement   Electronically Signed   By: Everlean Alstrom M.D.   On: 07/04/2014 11:58   Mm Radiologist Eval And Mgmt  07/05/2014   EXAM: ESTABLISHED PATIENT OFFICE VISIT - LEVEL II  CHIEF COMPLAINT: Status post ultrasound-guided core needle biopsy of a 2.2 cm mass in the 1 o'clock position of the left breast.  Current Pain Level: 4 out of 10  HISTORY OF PRESENT ILLNESS: Architectural distortion at mammography containing a 2.2 cm mass at ultrasound in the upper-outer quadrant of the left breast with imaging features suspicious for malignancy. The mass was biopsied under ultrasound guidance yesterday.  EXAM: The patient has palpable soft tissue thickening in the upper outer left breast which she reports is unchanged compared to thickening that she was feeling prior to biopsy. There is no bruising or palpable hematoma at this time.  PATHOLOGY: Invasive and in situ mammary carcinoma with a lobular phenotype, grade 2.  ASSESSMENT AND PLAN: ASSESSMENT AND PLAN The pathological diagnosis is concordant with the imaging findings. This was discussed with the patient and her husband. Their questions were answered.  The patient was given an appointment for a bilateral breast MR without and with contrast at 2 p.m.  on 07/08/2014 at [facility] [place]. She will be contacted by the [facility] for a clinic appointment. She was given clinic materials and Scientist, clinical (histocompatibility and immunogenetics).   Electronically Signed   By: Claudie Revering M.D.   On: 07/05/2014 16:15   US Breast Ltd Uni Left Inc Axilla  07/04/2014   CLINICAL DATA:  Screening recall for a possible mass in the outer left breast. The patient feels an area of thickening in the outer left breast.  EXAM: DIGITAL DIAGNOSTIC LEFT MAMMOGRAM WITH 3D TOMOSYNTHESIS AND CAD  LEFT BREAST ULTRASOUND  COMPARISON:  Previous exams.  ACR Breast Density Category c: The breast tissue is heterogeneously dense, which may obscure small masses.  FINDINGS: Spot compression CC and MLO tomosynthesis was performed over the upper-outer left breast demonstrating an irregular mass with associated distortion measuring approximately 2.3 cm.  Mammographic images were processed with CAD.  Physical examination of the outer left breast reveals a large  area of thickening at the approximate 1 to 2 o'clock position.  Targeted ultrasound of the left breast was performed demonstrating an irregular shadowing mass at 1 o'clock 5 cm from the nipple measuring 1.5 x 1.2 x 2.2 cm. The area of palpable concern feels larger than would is seen sonographically.  No lymphadenopathy seen in the left axilla.  IMPRESSION: Highly suspicious left breast mass.  RECOMMENDATION: Ultrasound-guided biopsy of the highly suspicious mass in the upper-outer left breast is recommended. This will subsequently be performed and dictated separately.  I have discussed the findings and recommendations with the patient. Results were also provided in writing at the conclusion of the visit. If applicable, a reminder letter will be sent to the patient regarding the next appointment.  BI-RADS CATEGORY  5: Highly suggestive of malignancy.   Electronically Signed   By: Everlean Alstrom M.D.   On: 07/04/2014 11:55   Mm  Diag Breast Tomo Uni Left  07/04/2014   CLINICAL DATA:  Screening recall for a possible mass in the outer left breast. The patient feels an area of thickening in the outer left breast.  EXAM: DIGITAL DIAGNOSTIC LEFT MAMMOGRAM WITH 3D TOMOSYNTHESIS AND CAD  LEFT BREAST ULTRASOUND  COMPARISON:  Previous exams.  ACR Breast Density Category c: The breast tissue is heterogeneously dense, which may obscure small masses.  FINDINGS: Spot compression CC and MLO tomosynthesis was performed over the upper-outer left breast demonstrating an irregular mass with associated distortion measuring approximately 2.3 cm.  Mammographic images were processed with CAD.  Physical examination of the outer left breast reveals a large area of thickening at the approximate 1 to 2 o'clock position.  Targeted ultrasound of the left breast was performed demonstrating an irregular shadowing mass at 1 o'clock 5 cm from the nipple measuring 1.5 x 1.2 x 2.2 cm. The area of palpable concern feels larger than would is seen sonographically.  No lymphadenopathy seen in the left axilla.  IMPRESSION: Highly suspicious left breast mass.  RECOMMENDATION: Ultrasound-guided biopsy of the highly suspicious mass in the upper-outer left breast is recommended. This will subsequently be performed and dictated separately.  I have discussed the findings and recommendations with the patient. Results were also provided in writing at the conclusion of the visit. If applicable, a reminder letter will be sent to the patient regarding the next appointment.  BI-RADS CATEGORY  5: Highly suggestive of malignancy.   Electronically Signed   By: Everlean Alstrom M.D.   On: 07/04/2014 11:55   Mm Screening Breast Tomo Bilateral  06/23/2014   CLINICAL DATA:  Screening. Patient thinks she feels a thickening in the lateral side of the breast.  EXAM: DIGITAL SCREENING BILATERAL MAMMOGRAM WITH 3D TOMO WITH CAD  COMPARISON:  Previous exam(s).  ACR Breast Density Category c: The  breast tissue is heterogeneously dense, which may obscure small masses.  FINDINGS: In the left breast, a possible mass warrants further evaluation. There is developing density in the upper-outer quadrant of the left breast. In the right breast, no findings suspicious for malignancy.  Images were processed with CAD.  IMPRESSION: Further evaluation is suggested for possible mass in the left breast.  RECOMMENDATION: Diagnostic mammogram and possibly ultrasound of the left breast. (Code:[id number]-58M)  The patient will be contacted regarding the findings, and additional imaging will be scheduled.  BI-RADS CATEGORY  0: Incomplete. Need additional imaging evaluation and/or prior mammograms for comparison.   Electronically Signed   By: Nolon Nations M.D.   On: 06/23/2014 13:37  [place] Lt Breast Bx W Loc Dev 1st Lesion Img Bx Spec US Guide  07/04/2014   CLINICAL DATA:  49 year old female with a highly suspicious mass in the upper-outer left breast at 1-2 o'clock.  EXAM: ULTRASOUND GUIDED LEFT BREAST CORE NEEDLE BIOPSY  COMPARISON:  Previous exam(s).  PROCEDURE: I met with the patient and we discussed the procedure of ultrasound-guided biopsy, including benefits and alternatives. We discussed the high likelihood of a successful procedure. We discussed the risks of the procedure including infection, bleeding, tissue injury, clip migration, and inadequate sampling. Informed written consent was given. The usual time-out protocol was performed immediately prior to the procedure.  Using sterile technique and 2% Lidocaine as local anesthetic, under direct ultrasound visualization, a 12 gauge vacuum-assisted device was used to perform biopsy of the mass in the upper-outer left breast at 1-2 o'clockusing a lateral approach. At the conclusion of the procedure, a ribbon shaped tissue marker clip was deployed into the biopsy cavity. Follow-up 2-view mammogram was performed and dictated separately.  IMPRESSION: Ultrasound-guided biopsy of  the highly suspicious mass in the upper-outer left breast at 1 o'clock. No apparent complications.   Electronically Signed   By: Everlean Alstrom M.D.   On: 07/04/2014 11:56      IMPRESSION/PLAN: Stage IIB left breast cancer, family history.  Order genetics testing. Potential to put patient on anti estrogens now, since cancer is ER+, while waiting on genetics testing results. If genetics is negative, unilateral mastectomy and sentinel node. If genetics is positive, bilateral mastectomy would be recommend. Patient is thinking about opting for bilateral mastectomy regardless. Would not recommend immediate reconstruction.  She is currently scheduled for genetic testing on the May 12th at 10 am. Will be starting Tamoxifen.   We will regroup after surgery to discuss radiation therapy again, as I will need to review the pathology report. Discussed indications for post mastectomy RT, including positive nodes, positive margins, and large tumor size >5cm with other risk factors.  They, being the patient and her husband, expressed concern about waiting 3 weeks for the genetic testing results. Discussed double mastectomy - she is possibly going to desire this regardless. She will discuss more with DrToth.   This document serves as a record of services personally performed by [name], MD. It was created on her behalf by Arlyce Harman, a trained medical scribe. The creation of this record is based on the scribe's personal observations and the provider's statements to them. This document has been checked and approved by the attending provider.  __________________________________________   [name], MD

## 2014-07-13 NOTE — Progress Notes (Signed)
Checked in new pt with no financial concerns prior to seeing the dr.  Informed pt if chemo is part of her treatment we will call her ins to see if Josem Kaufmann is required and will obtain it if it is as well as contact foundations that offer copay assistance for chemo if needed.  She has my card for any billing questions or concerns.

## 2014-07-13 NOTE — Patient Instructions (Signed)

## 2014-07-14 ENCOUNTER — Ambulatory Visit (HOSPITAL_BASED_OUTPATIENT_CLINIC_OR_DEPARTMENT_OTHER): Payer: BLUE CROSS/BLUE SHIELD | Admitting: Genetic Counselor

## 2014-07-14 ENCOUNTER — Other Ambulatory Visit: Payer: BLUE CROSS/BLUE SHIELD

## 2014-07-14 ENCOUNTER — Telehealth: Payer: Self-pay | Admitting: Oncology

## 2014-07-14 DIAGNOSIS — Z8041 Family history of malignant neoplasm of ovary: Secondary | ICD-10-CM | POA: Diagnosis not present

## 2014-07-14 DIAGNOSIS — C50919 Malignant neoplasm of unspecified site of unspecified female breast: Secondary | ICD-10-CM | POA: Insufficient documentation

## 2014-07-14 DIAGNOSIS — C50912 Malignant neoplasm of unspecified site of left female breast: Secondary | ICD-10-CM

## 2014-07-14 DIAGNOSIS — Z8049 Family history of malignant neoplasm of other genital organs: Secondary | ICD-10-CM

## 2014-07-14 DIAGNOSIS — Z803 Family history of malignant neoplasm of breast: Secondary | ICD-10-CM | POA: Diagnosis not present

## 2014-07-14 NOTE — Telephone Encounter (Signed)
Left voice mail confirmed June appointment.Mailed calendar

## 2014-07-14 NOTE — Progress Notes (Signed)
[place] Patient Visit  REFERRING PROVIDER: Delilah Shan, MD [address] [facility] [place], [place] [address]  PRIMARY PROVIDER:  [name], MD  PRIMARY REASON FOR VISIT:  1. Breast cancer, left   2. Family history of malignant neoplasm of breast   3. Family history of malignant neoplasm of ovary   4. Family history of uterine cancer     HISTORY OF PRESENT ILLNESS:   Lori [name], a 49 y.o. female, was seen for a [place] cancer genetics consultation at the request of Dr. Claiborne [name] due to a personal and family history of cancer.  Lori [name] presents to clinic today to discuss the possibility of a hereditary predisposition to cancer, genetic testing, and to further clarify her future cancer risks, as well as potential cancer risks for family members.   CANCER HISTORY:     Breast cancer of upper-outer quadrant of left female breast   07/04/2014 Initial Biopsy Breast, left, needle core biopsy, 1 o'clock - INVASIVE AND IN SITU MAMMARY CARCINOMA.   07/04/2014 Receptors her2 Estrogen Receptor: 50%, POSITIVE, STRONG STAINING INTENSITY (PERFORMED MANUALLY) Progesterone Receptor: 1%, POSITIVE, MODERATE STAINING INTENSITY(PERFORMED MANUALLY) Proliferation Marker Ki67: 1% (PERFORMED MANUALLY)  HER-2/NEU BY CISH - NEGATIVE.   07/07/2014 Initial Diagnosis Breast cancer of upper-outer quadrant of left female breast   07/08/2014 Breast MRI Large area of abnormal enhancement throughout the left breast. The more masslike component is seen in the upper-outer quadrant, associated with a biopsy clip and showing the most worrisome enhancement.   07/08/2014 Breast MRI Additional non mass stippled enhancement is identified throughout the other quadrants of the left breast suspicious for in situ disease. Additional biopsy can be performed to document extent of disease as needed if the patient is a candidate for breast    Past Medical History   Diagnosis Date   Asthma     last trimester of pregnancy 3 yrs ago-  never had problems since then, no meds   Breast cancer     Past Surgical History  Procedure Laterality Date   Cesarean section      x 2   Knee arthroscopy Left 07/27/2013    Procedure: LEFT ARTHROSCOPY KNEE WITH MEDIAL MENISCAL DEBRIDEMENT;  Surgeon: Gearlean Alf, MD;  Location: [facility];  Service: Orthopedics;  Laterality: Left;    History   Social History   Marital Status: Married    Spouse Name: N/A   Number of Children: N/A   Years of Education: N/A   Social History Main Topics   Smoking status: Former Smoker -- 1.00 packs/day for 8 years    Types: Cigarettes    Quit date: 07/20/2000   Smokeless tobacco: Never Used   Alcohol Use: Yes     Comment: socially   Drug Use: No   Sexual Activity: Not on file   Other Topics Concern   Not on file   Social History Narrative     FAMILY HISTORY:  During the visit, a 4-generation pedigree was obtained. A copy of the pedigree with be scanned into Epic under the Media tab. Significant family history diagnoses include the following: Family History  Problem Relation Age of Onset   Breast cancer Mother 75   Lymphoma Maternal Aunt 71   Uterine cancer Paternal Aunt 81   Uterine cancer Paternal Grandmother 32   Ovarian cancer Other 57    mat great aunt through [facility]   Ovarian cancer Maternal Grandmother 97  Lori [name] ancestry is of Caucasian descent. There is no known Jewish ancestry or consanguinity.  GENETIC COUNSELING ASSESSMENT:  Lori [name] is a 48 y.o. female with a personal and family history of cancer highly suggestive of a hereditary predisposition to cancer. We, therefore, discussed and recommended the following at today's visit.   DISCUSSION:  We reviewed the characteristics, features and inheritance patterns of hereditary cancer syndromes. We also discussed genetic testing, including the appropriate family members to test, the  process of testing, insurance coverage and turn-around-time for results. We discussed the implications of a negative, positive and/or variant of uncertain significant result. In order to get genetic test results in a timely manner so that Lori [name] can use these genetic test results for surgical decisions, we recommended Lori [name] pursue genetic testing for the BRCAplus gene panel. The BRCAplus gene panel offered by [employer] includes sequencing and rearrangement analysis for the following 6 genes: BRCA1, BRCA2, CDH1, PALB2, PTEN, and TP53. Due to the family history of uterine cancer on the paternal side of the family, we also recommended Lori [name] pursue reflex genetic testing for the remaining genes on the OvaNext gene panel. The OvaNext gene panel offered by [employer] includes sequencing and rearrangement analysis for the following 24 genes:ATM, BARD1, BRCA1, BRCA2, BRIP1, CDH1, CHEK2, EPCAM, MLH1, MRE11A, MSH2, MSH6, MUTYH, NBN, NF1, PALB2, PMS2, PTEN, RAD50, RAD51C, RAD51D, SMARCA4, STK11, and TP53.  PLAN:  Based on our above recommendation, Lori [name] wished to pursue genetic testing and the blood sample was drawn and will be sent to [facility] for analysis. Results for the BRCAplus gene panel should be available within approximately 2 weeks time, at which point they will be disclosed by telephone to Lori [name], as will any additional recommendations warranted by these results. The OvaNext gene panel will take a total of 4 weeks and we will also call once these results are available. Lastly, we encouraged Lori [name] to remain in contact with cancer genetics annually so that we can continuously update the family history and inform her of any changes in cancer genetics and testing that may be of benefit for this family.   Ms.  [name] questions were answered to her satisfaction today. Our contact information was provided should additional questions or concerns arise.  Thank you for the referral and allowing [place] to share in the care of your patient.   Catherine A. Fine, MS, CGC Certified Psychologist, sport [email or web address] phone: [phone]  The patient was seen for a total of 55 minutes in face-to-face genetic counseling.  This patient was discussed with Dr. Vivia Birmingham who agrees with the above.    ______________________________________________________________________ For Office Staff:  Number of people involved in session including genetic counselor: 3 Was an intern or student involved with case: not applicable

## 2014-07-15 ENCOUNTER — Encounter: Payer: Self-pay | Admitting: General Practice

## 2014-07-15 NOTE — Progress Notes (Signed)
[place] Psychosocial Distress Screening Spiritual Care  Met with Lori [name] and husband Lori [name] in [facility] to introduce [place] team/resources, reviewing distress screen per protocol.  The patient scored a 8 on the Psychosocial Distress Thermometer which indicates severe distress. Also assessed for distress and other psychosocial needs.   ONCBCN DISTRESS SCREENING 07/15/2014  Screening Type Initial Screening  Distress experienced in past week (1-10) 8  Practical problem type Work/school  Emotional problem type Nervousness/Anxiety;Adjusting to illness;Adjusting to appearance changes  Spiritual/Religous concerns type Facing my mortality  Physical Problem type Sleep/insomnia  Referral to support programs Yes  Other [name] was very distressed during our time together, naming strong anxiety about adjusting to appearance changes and facing her mortality.  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  This struggle creates some feelings of guilt and shame as she evaluates her (perception of her) performance.  Couple also shared that this dx has helped them reconnect and become more of a team.  Per husband Lori [name], he is unemployed ("good thing, because I didn't like my job...and now I'm free to help [name]") and has very strong communities of support.  Per couple, [name] has some close friends and family members, but not such support circles.  Pt declined [facility] referral at this time because she desires more space to process privately.  Couple used chaplain encounter constructively for meaning-making and emotional processing/reflection.  Follow up needed: Yes.  Plan to phone for f/u support.  Please also page as needs arise.  [place], [place]

## 2014-07-18 ENCOUNTER — Telehealth: Payer: Self-pay | Admitting: *Deleted

## 2014-07-18 ENCOUNTER — Other Ambulatory Visit: Payer: Self-pay | Admitting: General Surgery

## 2014-07-18 DIAGNOSIS — C50412 Malignant neoplasm of upper-outer quadrant of left female breast: Secondary | ICD-10-CM

## 2014-07-18 NOTE — Telephone Encounter (Signed)
Spoke to pt concerning [place] from 07/13/14.  Pt has decided to have bilateral mastectomies. Physician team notified as well as Dr. Ethlyn Gallery nurse. Pt would like to see with Dr. Iran Planas or Dr. Migdalia Dk for plastic referral. Denies further needs at this time.

## 2014-07-29 ENCOUNTER — Encounter: Payer: Self-pay | Admitting: Genetic Counselor

## 2014-07-29 DIAGNOSIS — Z803 Family history of malignant neoplasm of breast: Secondary | ICD-10-CM

## 2014-07-29 DIAGNOSIS — Z8049 Family history of malignant neoplasm of other genital organs: Secondary | ICD-10-CM

## 2014-07-29 DIAGNOSIS — Z8041 Family history of malignant neoplasm of ovary: Secondary | ICD-10-CM

## 2014-07-29 DIAGNOSIS — C50912 Malignant neoplasm of unspecified site of left female breast: Secondary | ICD-10-CM

## 2014-07-29 NOTE — Progress Notes (Signed)
Ms. Honeycutt recently had cancer genetic counseling at [facility] on 07/14/2014. At that time, it was recommended she pursue genetic testing. Her BRCAplus gene panel test, which was performed at [facility], has returned and is negative for mutations. The BRCAplus gene panel offered by [employer] includes sequencing and rearrangement analysis for the following 6 genes: BRCA1, BRCA2, CDH1, PALB2, PTEN, and TP53. These results were disclosed to her today.   Per her request, reflex testing for the remaining genes on the OvaNext gene panel at [facility] was initiated. The OvaNext gene panel offered by [employer] includes sequencing and rearrangement analysis for the following 24 genes:ATM, BARD1, BRCA1, BRCA2, BRIP1, CDH1, CHEK2, EPCAM, MLH1, MRE11A, MSH2, MSH6, MUTYH, NBN, NF1, PALB2, PMS2, PTEN, RAD50, RAD51C, RAD51D, SMARCA4, STK11, and TP53.  Results for the remaining genes on the gene panel should be available in 2-3 more weeks and we will contact her to discuss these results and recommendations warranted by these results, once available.

## 2014-08-02 NOTE — Pre-Procedure Instructions (Signed)
    Lori [name]  08/02/2014      [facility] [facility] [place] [place] [address] [place] [address] Phone: [phone] Fax: [phone]    Your procedure is scheduled on 08/10/14.  Report to [facility] Admitting at 830 A.M.  Call this number if you have problems the morning of surgery:  [phone]   Remember:  Do not eat food or drink liquids after midnight.  Take these medicines the morning of surgery with A SIP OF WATER tamoxifen   Do not wear jewelry, make-up or nail polish.  Do not wear lotions, powders, or perfumes.  You may wear deodorant.  Do not shave 48 hours prior to surgery.  Men may shave face and neck.  Do not bring valuables to the hospital.  [facility] is not responsible for any belongings or valuables.  Contacts, dentures or bridgework may not be worn into surgery.  Leave your suitcase in the car.  After surgery it may be brought to your room.  For patients admitted to the hospital, discharge time will be determined by your treatment team.  Patients discharged the day of surgery will not be allowed to drive home.   Name and phone number of your driver:  Special instructions:   Please read over the following fact sheets that you were given. Pain Booklet, Coughing and Deep Breathing and Surgical Site Infection Prevention

## 2014-08-03 ENCOUNTER — Encounter (HOSPITAL_COMMUNITY): Payer: Self-pay

## 2014-08-03 ENCOUNTER — Encounter (HOSPITAL_COMMUNITY)
Admission: RE | Admit: 2014-08-03 | Discharge: 2014-08-03 | Disposition: A | Discharge status: Home / Self Care | Payer: BLUE CROSS/BLUE SHIELD | Source: Ambulatory Visit | Attending: General Surgery | Admitting: General Surgery

## 2014-08-03 DIAGNOSIS — C50912 Malignant neoplasm of unspecified site of left female breast: Secondary | ICD-10-CM | POA: Insufficient documentation

## 2014-08-03 DIAGNOSIS — Z01812 Encounter for preprocedural laboratory examination: Secondary | ICD-10-CM | POA: Diagnosis not present

## 2014-08-03 HISTORY — DX: Anxiety disorder, unspecified: F41.9

## 2014-08-03 LAB — BASIC METABOLIC PANEL
Anion gap: 8 (ref 5–15)
BUN: 10 mg/dL (ref 6–20)
CALCIUM: 9.3 mg/dL (ref 8.9–10.3)
CO2: 25 mmol/L (ref 22–32)
Chloride: 106 mmol/L (ref 101–111)
Creatinine, Ser: 0.68 mg/dL (ref 0.44–1.00)
GFR calc Af Amer: >60 mL/min (ref >60)
GFR calc non Af Amer: >60 mL/min (ref >60)
Glucose, Bld: 89 mg/dL (ref 65–99)
POTASSIUM: 4.1 mmol/L (ref 3.5–5.1)
Sodium: 139 mmol/L (ref 135–145)

## 2014-08-03 LAB — CBC
HCT: 42.4 % (ref 36.0–46.0)
HEMOGLOBIN: 14.1 g/dL (ref 12.0–15.0)
MCH: 29.1 pg (ref 26.0–34.0)
MCHC: 33.3 g/dL (ref 30.0–36.0)
MCV: 87.4 fL (ref 78.0–100.0)
PLATELETS: 250 10*3/uL (ref 150–400)
RBC: 4.85 MIL/uL (ref 3.87–5.11)
RDW: 12.6 % (ref 11.5–15.5)
WBC: 7 10*3/uL (ref 4.0–10.5)

## 2014-08-03 LAB — HCG, SERUM, QUALITATIVE: Preg, Serum: NEGATIVE

## 2014-08-09 ENCOUNTER — Encounter: Payer: Self-pay | Admitting: Genetic Counselor

## 2014-08-09 DIAGNOSIS — C50912 Malignant neoplasm of unspecified site of left female breast: Secondary | ICD-10-CM

## 2014-08-09 DIAGNOSIS — Z8041 Family history of malignant neoplasm of ovary: Secondary | ICD-10-CM

## 2014-08-09 DIAGNOSIS — Z803 Family history of malignant neoplasm of breast: Secondary | ICD-10-CM

## 2014-08-09 DIAGNOSIS — Z8049 Family history of malignant neoplasm of other genital organs: Secondary | ICD-10-CM

## 2014-08-09 MED ORDER — CEFAZOLIN SODIUM-DEXTROSE 2-3 GM-% IV SOLR
2.0000 g | INTRAVENOUS | Status: AC
  Administered 2014-08-10: 2 g via INTRAVENOUS
  Filled 2014-08-09: qty 50

## 2014-08-09 NOTE — Progress Notes (Signed)
°[facility] - Genetics Clinic °Genetic Test Results ° ° °REFERRING PROVIDER: °Dr. Magrinat ° °PRIMARY PROVIDER:  °KELLY,SAM, MD ° °GENETIC TEST RESULT:  °Testing Laboratory: [facility]  °Test Ordered: OvaNext gene panel °Date of Report: 08/04/2014 °Result: Normal, no pathogenic mutations identified °General Interpretation: Reassuring, recommend testing family member ° °HPI: Ms. Chiem was previously seen in the [facility] due to concerns regarding a hereditary predisposition to cancer. Please refer to our prior cancer genetics clinic note for more information regarding Ms. Baum's medical, social and family histories, and our assessment and recommendations, at the time. Ms. Niemeier's genetic test results and recommendations warranted by these results were recently disclosed to her and are discussed in more detail below. ° °GENETIC TEST RESULTS: At the time of Ms. Logiudice's visit, we recommended she pursue genetic testing, which includes sequencing and deletion/duplication analysis of several genes associated with an increased risk for cancer via a gene panel. The OvaNext gene panel offered by [facility] includes sequencing and rearrangement analysis for the following 24 genes:ATM, BARD1, BRCA1, BRCA2, BRIP1, CDH1, CHEK2, EPCAM, MLH1, MRE11A, MSH2, MSH6, MUTYH, NBN, NF1, PALB2, PMS2, PTEN, RAD50, RAD51C, RAD51D, SMARCA4, STK11, and TP53. Genetic testing for this gene panel was normal and did not reveal a pathogenic mutation in any of these genes. A copy of the genetic test report will be scanned into Epic under the media tab.  ° °We discussed with Ms. Borjon that current genetic testing is not perfect, and it is, therefore, possible there may be a pathogenic gene mutation in one of these genes that current testing cannot detect, but that chance is small.  We also discussed, that it is possible that another gene that has not yet been discovered, or that we  have not yet tested, is responsible for the cancer diagnoses in her family. It is, therefore, important for Ms. Bellucci to continue to remain in touch with cancer genetics so that we can continue to offer Ms. Backes the most up to date genetic testing.  ° °CANCER SCREENING RECOMMENDATIONS: This result is reassuring for Ms. Magee and her children and indicates that Ms. Kunkle likely does not have an increased risk for a future cancer due to a mutation in one of these genes. This normal test also suggests that Ms. Erekson's cancer was most likely not due to an inherited predisposition associated with one of these genes.  Most cancers happen by chance and this negative test suggests that her cancer falls into this category.  We, therefore, recommended she continue to follow the cancer management and screening guidelines provided by her oncology and primary healthcare providers.  °  °RECOMMENDATIONS FOR FAMILY MEMBERS: While these results are reassuring for Ms. Sidener, this test does not tell us anything about Ms. Loudin's siblings or maternal relaives' risks. We recommended these relatives also have genetic counseling and testing and are happy to help facilitate testing and/or a referral if needed. Cancer genetic counselors can also be located, by visiting the website of the National Society of Genetic Counselors (www.nsgc.org) and searching for a cancer genetic counselor by zip code. °  °FOLLOW-UP: Lastly, we discussed with Ms. Hargraves that cancer genetics is a rapidly advancing field and it is likely that new genetic tests will be appropriate for her and/or family members in the future. We encouraged her to remain in contact with cancer genetics on an annual basis so we can update her personal and family histories and let her know of   of advances in cancer genetics that may benefit this family.   Our contact number was provided. Ms. Zappia questions were answered to her satisfaction, and she knows she is  welcome to call us at anytime with additional questions or concerns.    Catherine A. Fine, MS, CGC Certified Psychologist, sport and [email or web address] Phone: [phone]

## 2014-08-09 NOTE — Progress Notes (Signed)
Spoke with pt and instructed her to arrive at 0800

## 2014-08-10 ENCOUNTER — Ambulatory Visit (HOSPITAL_COMMUNITY): Payer: BLUE CROSS/BLUE SHIELD | Admitting: Anesthesiology

## 2014-08-10 ENCOUNTER — Encounter (HOSPITAL_COMMUNITY)
Admission: RE | Disposition: A | Discharge status: Home / Self Care | Payer: Self-pay | Source: Ambulatory Visit | Attending: General Surgery

## 2014-08-10 ENCOUNTER — Encounter (HOSPITAL_COMMUNITY): Payer: Self-pay | Admitting: *Deleted

## 2014-08-10 ENCOUNTER — Ambulatory Visit (HOSPITAL_COMMUNITY)
Admission: RE | Admit: 2014-08-10 | Discharge: 2014-08-12 | Disposition: A | Discharge status: Home / Self Care | Payer: BLUE CROSS/BLUE SHIELD | Source: Ambulatory Visit | Attending: General Surgery | Admitting: General Surgery

## 2014-08-10 ENCOUNTER — Encounter (HOSPITAL_COMMUNITY)
Admission: RE | Admit: 2014-08-10 | Discharge: 2014-08-10 | Disposition: A | Discharge status: Home / Self Care | Payer: BLUE CROSS/BLUE SHIELD | Source: Ambulatory Visit | Attending: General Surgery | Admitting: General Surgery

## 2014-08-10 DIAGNOSIS — Z87891 Personal history of nicotine dependence: Secondary | ICD-10-CM | POA: Diagnosis not present

## 2014-08-10 DIAGNOSIS — C50412 Malignant neoplasm of upper-outer quadrant of left female breast: Secondary | ICD-10-CM | POA: Insufficient documentation

## 2014-08-10 DIAGNOSIS — J45909 Unspecified asthma, uncomplicated: Secondary | ICD-10-CM | POA: Diagnosis not present

## 2014-08-10 DIAGNOSIS — C773 Secondary and unspecified malignant neoplasm of axilla and upper limb lymph nodes: Secondary | ICD-10-CM | POA: Insufficient documentation

## 2014-08-10 DIAGNOSIS — C50911 Malignant neoplasm of unspecified site of right female breast: Secondary | ICD-10-CM | POA: Diagnosis not present

## 2014-08-10 DIAGNOSIS — Z6832 Body mass index (BMI) 32.0-32.9, adult: Secondary | ICD-10-CM | POA: Diagnosis not present

## 2014-08-10 DIAGNOSIS — Z170 Estrogen receptor positive status [ER+]: Secondary | ICD-10-CM

## 2014-08-10 DIAGNOSIS — C50912 Malignant neoplasm of unspecified site of left female breast: Secondary | ICD-10-CM | POA: Diagnosis present

## 2014-08-10 HISTORY — PX: MASTECTOMY W/ SENTINEL NODE BIOPSY: SHX2001

## 2014-08-10 SURGERY — MASTECTOMY WITH SENTINEL LYMPH NODE BIOPSY
Anesthesia: General | Site: Breast | Laterality: Bilateral

## 2014-08-10 MED ORDER — CHLORHEXIDINE GLUCONATE 4 % EX LIQD: 1.0000 "application " | Freq: Once | CUTANEOUS | Status: DC

## 2014-08-10 MED ORDER — MEPERIDINE HCL 25 MG/ML IJ SOLN
6.2500 mg | INTRAMUSCULAR | Status: DC | PRN
Start: 2014-08-10 — End: 2014-08-10

## 2014-08-10 MED ORDER — KCL IN DEXTROSE-NACL 20-5-0.9 MEQ/L-%-% IV SOLN
INTRAVENOUS | Status: DC
  Administered 2014-08-10: 18:00:00 via INTRAVENOUS
  Filled 2014-08-10 (×5): qty 1000

## 2014-08-10 MED ORDER — MIDAZOLAM HCL 2 MG/2ML IJ SOLN
INTRAMUSCULAR | Status: AC
  Administered 2014-08-10: 2 mg
  Filled 2014-08-10: qty 2

## 2014-08-10 MED ORDER — DEXAMETHASONE SODIUM PHOSPHATE 4 MG/ML IJ SOLN
INTRAMUSCULAR | Status: DC | PRN
  Administered 2014-08-10: 4 mg via INTRAVENOUS

## 2014-08-10 MED ORDER — FENTANYL CITRATE (PF) 100 MCG/2ML IJ SOLN
100.0000 ug | Freq: Once | INTRAMUSCULAR | Status: AC
  Administered 2014-08-10: 100 ug via INTRAVENOUS

## 2014-08-10 MED ORDER — MIDAZOLAM HCL 2 MG/2ML IJ SOLN
1.0000 mg | Freq: Once | INTRAMUSCULAR | Status: AC
  Filled 2014-08-10: qty 1

## 2014-08-10 MED ORDER — FENTANYL CITRATE (PF) 100 MCG/2ML IJ SOLN
INTRAMUSCULAR | Status: AC
  Filled 2014-08-10: qty 2

## 2014-08-10 MED ORDER — PHENYLEPHRINE 40 MCG/ML (10ML) SYRINGE FOR IV PUSH (FOR BLOOD PRESSURE SUPPORT)
PREFILLED_SYRINGE | INTRAVENOUS | Status: AC
  Filled 2014-08-10: qty 10

## 2014-08-10 MED ORDER — MIDAZOLAM HCL 2 MG/2ML IJ SOLN
INTRAMUSCULAR | Status: AC
  Filled 2014-08-10: qty 2

## 2014-08-10 MED ORDER — ROCURONIUM BROMIDE 100 MG/10ML IV SOLN
INTRAVENOUS | Status: DC | PRN
  Administered 2014-08-10: 20 mg via INTRAVENOUS
  Administered 2014-08-10: 10 mg via INTRAVENOUS
  Administered 2014-08-10: 20 mg via INTRAVENOUS
  Administered 2014-08-10: 30 mg via INTRAVENOUS

## 2014-08-10 MED ORDER — PROPOFOL 10 MG/ML IV BOLUS
INTRAVENOUS | Status: DC | PRN
  Administered 2014-08-10 (×2): 200 mg via INTRAVENOUS
  Administered 2014-08-10: 50 mg via INTRAVENOUS

## 2014-08-10 MED ORDER — CHLORHEXIDINE GLUCONATE 4 % EX LIQD
1.0000 | Freq: Once | CUTANEOUS | Status: DC
Start: 2014-08-11 — End: 2014-08-10

## 2014-08-10 MED ORDER — EPHEDRINE SULFATE 50 MG/ML IJ SOLN
INTRAMUSCULAR | Status: DC | PRN
  Administered 2014-08-10: 5 mg via INTRAVENOUS

## 2014-08-10 MED ORDER — FENTANYL CITRATE (PF) 100 MCG/2ML IJ SOLN
INTRAMUSCULAR | Status: DC | PRN
  Administered 2014-08-10 (×2): 50 ug via INTRAVENOUS
  Administered 2014-08-10: 100 ug via INTRAVENOUS

## 2014-08-10 MED ORDER — PROPOFOL 10 MG/ML IV BOLUS
INTRAVENOUS | Status: AC
  Filled 2014-08-10: qty 20

## 2014-08-10 MED ORDER — LACTATED RINGERS IV SOLN
INTRAVENOUS | Status: DC
  Administered 2014-08-10 (×3): via INTRAVENOUS

## 2014-08-10 MED ORDER — MIDAZOLAM HCL 2 MG/2ML IJ SOLN
INTRAMUSCULAR | Status: AC
  Administered 2014-08-10: 1 mg
  Filled 2014-08-10: qty 2

## 2014-08-10 MED ORDER — HYDROMORPHONE HCL 1 MG/ML IJ SOLN
INTRAMUSCULAR | Status: AC
  Filled 2014-08-10: qty 1

## 2014-08-10 MED ORDER — MIDAZOLAM HCL 2 MG/2ML IJ SOLN: 0.5000 mg | Freq: Once | INTRAMUSCULAR | Status: DC | PRN

## 2014-08-10 MED ORDER — HEPARIN SODIUM (PORCINE) 5000 UNIT/ML IJ SOLN
5000.0000 [IU] | Freq: Three times a day (TID) | INTRAMUSCULAR | Status: DC
  Administered 2014-08-11 – 2014-08-12 (×3): 5000 [IU] via SUBCUTANEOUS
  Filled 2014-08-10 (×4): qty 1

## 2014-08-10 MED ORDER — SCOPOLAMINE 1 MG/3DAYS TD PT72
MEDICATED_PATCH | TRANSDERMAL | Status: AC
  Filled 2014-08-10: qty 1

## 2014-08-10 MED ORDER — ONDANSETRON HCL 4 MG/2ML IJ SOLN
INTRAMUSCULAR | Status: DC | PRN
  Administered 2014-08-10: 4 mg via INTRAVENOUS

## 2014-08-10 MED ORDER — DEXAMETHASONE SODIUM PHOSPHATE 4 MG/ML IJ SOLN
INTRAMUSCULAR | Status: AC
  Filled 2014-08-10: qty 1

## 2014-08-10 MED ORDER — SCOPOLAMINE 1 MG/3DAYS TD PT72
1.0000 | MEDICATED_PATCH | Freq: Once | TRANSDERMAL | Status: DC
  Administered 2014-08-10: 1.5 mg via TRANSDERMAL

## 2014-08-10 MED ORDER — 0.9 % SODIUM CHLORIDE (POUR BTL) OPTIME
TOPICAL | Status: DC | PRN
  Administered 2014-08-10: 1000 mL

## 2014-08-10 MED ORDER — MIDAZOLAM HCL 2 MG/2ML IJ SOLN
2.0000 mg | Freq: Once | INTRAMUSCULAR | Status: AC
  Filled 2014-08-10: qty 2

## 2014-08-10 MED ORDER — TAMOXIFEN CITRATE 20 MG PO TABS
20.0000 mg | ORAL_TABLET | Freq: Every day | ORAL | Status: DC
  Administered 2014-08-11 – 2014-08-12 (×2): 20 mg via ORAL
  Filled 2014-08-10 (×2): qty 1

## 2014-08-10 MED ORDER — ONDANSETRON HCL 4 MG/2ML IJ SOLN
INTRAMUSCULAR | Status: AC
  Filled 2014-08-10: qty 2

## 2014-08-10 MED ORDER — LIDOCAINE HCL (CARDIAC) 20 MG/ML IV SOLN
INTRAVENOUS | Status: AC
  Filled 2014-08-10: qty 5

## 2014-08-10 MED ORDER — ROCURONIUM BROMIDE 50 MG/5ML IV SOLN
INTRAVENOUS | Status: AC
  Filled 2014-08-10: qty 1

## 2014-08-10 MED ORDER — GLYCOPYRROLATE 0.2 MG/ML IJ SOLN
INTRAMUSCULAR | Status: AC
  Filled 2014-08-10: qty 3

## 2014-08-10 MED ORDER — NEOSTIGMINE METHYLSULFATE 10 MG/10ML IV SOLN
INTRAVENOUS | Status: AC
  Filled 2014-08-10: qty 1

## 2014-08-10 MED ORDER — GLYCOPYRROLATE 0.2 MG/ML IJ SOLN
INTRAMUSCULAR | Status: DC | PRN
  Administered 2014-08-10: 0.6 mg via INTRAVENOUS

## 2014-08-10 MED ORDER — PROMETHAZINE HCL 25 MG/ML IJ SOLN: 6.2500 mg | INTRAMUSCULAR | Status: DC | PRN

## 2014-08-10 MED ORDER — MORPHINE SULFATE 2 MG/ML IJ SOLN
1.0000 mg | INTRAMUSCULAR | Status: DC | PRN
  Administered 2014-08-10: 4 mg via INTRAVENOUS
  Administered 2014-08-10 – 2014-08-11 (×2): 2 mg via INTRAVENOUS
  Filled 2014-08-10 (×3): qty 1
  Filled 2014-08-10: qty 2

## 2014-08-10 MED ORDER — ONDANSETRON HCL 4 MG PO TABS: 4.0000 mg | ORAL_TABLET | Freq: Four times a day (QID) | ORAL | Status: DC | PRN

## 2014-08-10 MED ORDER — BUPIVACAINE-EPINEPHRINE (PF) 0.5% -1:200000 IJ SOLN
INTRAMUSCULAR | Status: DC | PRN
  Administered 2014-08-10: 20 mL
  Administered 2014-08-10: 25 mL

## 2014-08-10 MED ORDER — FENTANYL CITRATE (PF) 250 MCG/5ML IJ SOLN
INTRAMUSCULAR | Status: AC
  Filled 2014-08-10: qty 5

## 2014-08-10 MED ORDER — NEOSTIGMINE METHYLSULFATE 10 MG/10ML IV SOLN
INTRAVENOUS | Status: DC | PRN
  Administered 2014-08-10: 5 mg via INTRAVENOUS

## 2014-08-10 MED ORDER — LIDOCAINE HCL (CARDIAC) 20 MG/ML IV SOLN
INTRAVENOUS | Status: DC | PRN
  Administered 2014-08-10: 60 mg via INTRAVENOUS

## 2014-08-10 MED ORDER — TECHNETIUM TC 99M SULFUR COLLOID FILTERED: 1.0000 | Freq: Once | INTRAVENOUS | Status: AC | PRN

## 2014-08-10 MED ORDER — OXYCODONE-ACETAMINOPHEN 5-325 MG PO TABS
1.0000 | ORAL_TABLET | ORAL | Status: DC | PRN
  Administered 2014-08-10: 2 via ORAL
  Filled 2014-08-10: qty 2

## 2014-08-10 MED ORDER — PHENYLEPHRINE HCL 10 MG/ML IJ SOLN
INTRAMUSCULAR | Status: DC | PRN
  Administered 2014-08-10 (×3): 40 ug via INTRAVENOUS

## 2014-08-10 MED ORDER — ONDANSETRON HCL 4 MG/2ML IJ SOLN: 4.0000 mg | Freq: Four times a day (QID) | INTRAMUSCULAR | Status: DC | PRN

## 2014-08-10 MED ORDER — HYDROMORPHONE HCL 1 MG/ML IJ SOLN
0.2500 mg | INTRAMUSCULAR | Status: DC | PRN
  Administered 2014-08-10: 0.5 mg via INTRAVENOUS

## 2014-08-10 SURGICAL SUPPLY — 55 items
APPLIER CLIP 11 MED OPEN (CLIP) ×3
APPLIER CLIP 9.375 MED OPEN (MISCELLANEOUS) ×3
BINDER BREAST LRG (GAUZE/BANDAGES/DRESSINGS) IMPLANT
BINDER BREAST XLRG (GAUZE/BANDAGES/DRESSINGS) ×3 IMPLANT
BIOPATCH RED 1 DISK 7.0 (GAUZE/BANDAGES/DRESSINGS) ×6 IMPLANT
BIOPATCH RED 1IN DISK 7.0MM (GAUZE/BANDAGES/DRESSINGS) ×3
CANISTER SUCTION 2500CC (MISCELLANEOUS) ×6 IMPLANT
CHLORAPREP W/TINT 26ML (MISCELLANEOUS) ×3 IMPLANT
CLIP APPLIE 11 MED OPEN (CLIP) ×1 IMPLANT
CLIP APPLIE 9.375 MED OPEN (MISCELLANEOUS) ×1 IMPLANT
CONT SPEC 4OZ CLIKSEAL STRL BL (MISCELLANEOUS) ×3 IMPLANT
COVER PROBE W GEL 5X96 (DRAPES) ×3 IMPLANT
COVER SURGICAL LIGHT HANDLE (MISCELLANEOUS) ×3 IMPLANT
DEVICE DISSECT PLASMABLAD 3.0S (MISCELLANEOUS) ×1 IMPLANT
DRAIN CHANNEL 19F RND (DRAIN) ×3 IMPLANT
DRAPE LAPAROSCOPIC ABDOMINAL (DRAPES) ×3 IMPLANT
DRAPE UTILITY XL STRL (DRAPES) ×6 IMPLANT
DRSG PAD ABDOMINAL 8X10 ST (GAUZE/BANDAGES/DRESSINGS) ×3 IMPLANT
DRSG TEGADERM 4X4.75 (GAUZE/BANDAGES/DRESSINGS) ×6 IMPLANT
ELECT CAUTERY BLADE 6.4 (BLADE) ×3 IMPLANT
ELECT REM PT RETURN 9FT ADLT (ELECTROSURGICAL) ×3
ELECTRODE REM PT RTRN 9FT ADLT (ELECTROSURGICAL) ×1 IMPLANT
EVACUATOR SILICONE 100CC (DRAIN) ×3 IMPLANT
GAUZE SPONGE 4X4 12PLY STRL (GAUZE/BANDAGES/DRESSINGS) ×3 IMPLANT
GAUZE XEROFORM 5X9 LF (GAUZE/BANDAGES/DRESSINGS) ×3 IMPLANT
GLOVE BIO SURGEON STRL SZ 6.5 (GLOVE) ×2 IMPLANT
GLOVE BIO SURGEON STRL SZ7.5 (GLOVE) ×3 IMPLANT
GLOVE BIO SURGEONS STRL SZ 6.5 (GLOVE) ×1
GLOVE BIOGEL PI IND STRL 7.0 (GLOVE) ×1 IMPLANT
GLOVE BIOGEL PI INDICATOR 7.0 (GLOVE) ×2
GOWN STRL REUS W/ TWL LRG LVL3 (GOWN DISPOSABLE) ×4 IMPLANT
GOWN STRL REUS W/TWL LRG LVL3 (GOWN DISPOSABLE) ×8
KIT BASIN OR (CUSTOM PROCEDURE TRAY) ×3 IMPLANT
KIT ROOM TURNOVER OR (KITS) ×3 IMPLANT
LIQUID BAND (GAUZE/BANDAGES/DRESSINGS) ×3 IMPLANT
NEEDLE 18GX1X1/2 (RX/OR ONLY) (NEEDLE) IMPLANT
NEEDLE HYPO 25GX1X1/2 BEV (NEEDLE) IMPLANT
NS IRRIG 1000ML POUR BTL (IV SOLUTION) ×3 IMPLANT
PACK GENERAL/GYN (CUSTOM PROCEDURE TRAY) ×3 IMPLANT
PAD ABD 8X10 STRL (GAUZE/BANDAGES/DRESSINGS) ×6 IMPLANT
PAD ARMBOARD 7.5X6 YLW CONV (MISCELLANEOUS) ×3 IMPLANT
PLASMABLADE 3.0S (MISCELLANEOUS) ×3
SPECIMEN JAR X LARGE (MISCELLANEOUS) ×3 IMPLANT
SPONGE GAUZE 4X4 12PLY STER LF (GAUZE/BANDAGES/DRESSINGS) ×3 IMPLANT
SUT ETHILON 3 0 FSL (SUTURE) ×3 IMPLANT
SUT MNCRL AB 4-0 PS2 18 (SUTURE) ×3 IMPLANT
SUT MON AB 4-0 PC3 18 (SUTURE) ×9 IMPLANT
SUT VIC AB 3-0 54X BRD REEL (SUTURE) IMPLANT
SUT VIC AB 3-0 BRD 54 (SUTURE)
SUT VIC AB 3-0 SH 18 (SUTURE) ×3 IMPLANT
SYR CONTROL 10ML LL (SYRINGE) IMPLANT
TOWEL OR 17X24 6PK STRL BLUE (TOWEL DISPOSABLE) ×3 IMPLANT
TOWEL OR 17X26 10 PK STRL BLUE (TOWEL DISPOSABLE) ×3 IMPLANT
TUBE CONNECTING 12'X1/4 (SUCTIONS) ×1
TUBE CONNECTING 12X1/4 (SUCTIONS) ×2 IMPLANT

## 2014-08-10 NOTE — Op Note (Signed)
08/10/2014  1:56 PM  PATIENT:  Lori [name]  49 y.o. female  PRE-OPERATIVE DIAGNOSIS:  Left Breast Cancer  POST-OPERATIVE DIAGNOSIS:  Left Breast Cancer  PROCEDURE:  Procedure(s): Left MASTECTOMY WITH SENTINEL LYMPH NODE BIOPSY and lymph node dissection and right prophylactic mastectomy  SURGEON:  Surgeon(s) and Role:    * Jovita Kussmaul, MD - Primary  PHYSICIAN ASSISTANT:   ASSISTANTS: none   ANESTHESIA:   general  EBL:  Total I/O In: 2200 [I.V.:2200] Out: -   BLOOD ADMINISTERED:none  DRAINS: (3) Jackson-Pratt drain(s) with closed bulb suction in the prepectoral space   LOCAL MEDICATIONS USED:  NONE  SPECIMEN:  Source of Specimen:  bilateral breasts, left axillary sentinel nodes and left axillary contents  DISPOSITION OF SPECIMEN:  PATHOLOGY  COUNTS:  YES  TOURNIQUET:  * No tourniquets in log *  DICTATION: .Dragon Dictation  After informed consent was obtained the patient was brought to the operating room and placed in the supine position on the operating room table. After adequate induction of general anesthesia the patient's bilateral chest, breast, and axillary areas were prepped with ChloraPrep, allowed to dry, and draped in usual sterile manner. Earlier in the day the patient underwent injection of 1 mCi of technetium sulfur colloid in the subareolar position on the left. Attention was first turned to the right breast. An elliptical incision was made around the nipple and areola complex in order to minimize the excess skin. The incision was carried through the skin and subcutaneous tissue sharply with the plasma blade. Breast hooks were then used to elevate the skin flaps anteriorly towards the ceiling and skin flaps were created between the breast tissue and the subcutaneous fat. This dissection was carried circumferentially until the dissection reached the chest wall. The breast was then removed from the pectoralis muscle with the pectoralis fascia. Once the breast  was removed it was oriented with a stitch on the lateral aspect and sent to pathology for further evaluation. A small stab incision was made in the anterior axillary line below the operative area. A tonsil clamp was placed through this incision and used to bring a 19 [place] round Blake drain into the operative bed. The wound was irrigated with copious amounts of saline. The deep layer of the wound was closed with interrupted 3-0 Vicryl stitches. The skin was then closed with a running 4-0 Monocryl subcuticular stitch. Attention was then turned to the left breast. A similar elliptical incision was made on the left breast. The incision was carried through the skin and subcutaneous tissue sharply with the plasma blade. Breast hooks were used to elevate the skin flaps anteriorly towards the ceiling. Skin flaps were created between the breast tissue in the subcutaneous fat. This dissection was carried out circumferentially until the dissection reached the chest wall. Laterally once the dissection reached the axilla the neoprobe was used to identify 4 hot lymph nodes which were excised sharply with the plasma blade. Touch preps on the first lymph node were positive. The breast was then removed from the pectoralis muscle with the pectoralis fascia. Once the breast was removed it was oriented with a stitch on the lateral aspect and sent to pathology for further evaluation. In the axilla of the serratus muscle medially, latissimus muscle laterally, and the axillary vein superiorly were identified. The fatty contents within the boundaries of the axilla were then removed by blunt right angle dissection. The thoracodorsal and long thoracic nerves were identified and spared. This tissue that was  removed was labeled as left axillary contents and sent to pathology for further evaluation. Wound was irrigated with copious amounts of saline. The wound was examined and found to be hemostatic. 2 small stab incisions were made near the  anterior axillary line inferior to the operative area. A tonsil clamp was placed through each of these incisions and used to bring a 19 [place] round Blake drain into the operative bed. The medial drain was curled along the chest wall and the lateral drain was placed in the axilla. All the drains were anchored to the skin with 3-0 nylon stitches. The deep layer of the wound was then closed with interrupted 3-0 Vicryl stitches. The skin was then closed with a running 4-0 Monocryl subcuticular stitch. Dermabond and sterile dressings were then applied. The patient tolerated the procedure well. At the end of the case all needle sponge and instrument counts were correct. The patient was then awakened and taken to recovery in stable condition.  PLAN OF CARE: Admit for overnight observation  PATIENT DISPOSITION:  PACU - hemodynamically stable.   Delay start of Pharmacological VTE agent (>24hrs) due to surgical blood loss or risk of bleeding: no

## 2014-08-10 NOTE — Transfer of Care (Signed)
Immediate Anesthesia Transfer of Care Note  Patient: Lori [name]  Procedure(s) Performed: Procedure(s): MASTECTOMY WITH SENTINEL LYMPH NODE BIOPSY (Bilateral)  Patient Location: PACU  Anesthesia Type:GA combined with regional for post-op pain  Level of Consciousness: awake, alert , oriented, patient cooperative and responds to stimulation  Airway & Oxygen Therapy: Patient Spontanous Breathing and Patient connected to nasal cannula oxygen  Post-op Assessment: Report given to RN, Post -op Vital signs reviewed and stable, Patient moving all extremities X 4 and Patient able to stick tongue midline  Post vital signs: stable  Last Vitals:  Filed Vitals:   08/10/14 1025  BP: 124/48  Pulse: 92  Temp:   Resp: 13    Complications: No apparent anesthesia complications

## 2014-08-10 NOTE — Interval H&P Note (Signed)
History and Physical Interval Note:  08/10/2014 9:59 AM  Lori [name]  has presented today for surgery, with the diagnosis of Left Breast Cancer  The various methods of treatment have been discussed with the patient and family. After consideration of risks, benefits and other options for treatment, the patient has consented to  Procedure(s): Left MASTECTOMY WITH SENTINEL LYMPH NODE BIOPSY  And Right prophylactic mastectomy as a surgical intervention .  The patient's history has been reviewed, patient examined, no change in status, stable for surgery.  I have reviewed the patient's chart and labs.  Questions were answered to the patient's satisfaction.     TOTH III,PAUL S

## 2014-08-10 NOTE — Anesthesia Preprocedure Evaluation (Addendum)
Anesthesia Evaluation  Patient identified by MRN, date of birth, ID band Patient awake    Reviewed: Allergy & Precautions, NPO status , Patient's Chart, lab work & pertinent test results  Airway Mallampati: I  TM Distance: >3 FB Neck ROM: Full    Dental  (+) Dental Advisory Given, Teeth Intact   Pulmonary asthma , former smoker (quit '02),  breath sounds clear to auscultation        Cardiovascular - anginanegative cardio ROS  Rhythm:Regular Rate:Normal     Neuro/Psych negative neurological ROS     GI/Hepatic negative GI ROS, Neg liver ROS,   Endo/Other  Morbid obesity  Renal/GU negative Renal ROS     Musculoskeletal   Abdominal (+) + obese,   Peds  Hematology negative hematology ROS (+)   Anesthesia Other Findings Breast cancer  Reproductive/Obstetrics 08/03/14 preg test NEG                            Anesthesia Physical Anesthesia Plan  ASA: II  Anesthesia Plan: General   Post-op Pain Management:    Induction: Intravenous  Airway Management Planned: LMA  Additional Equipment:   Intra-op Plan:   Post-operative Plan:   Informed Consent: I have reviewed the patients History and Physical, chart, labs and discussed the procedure including the risks, benefits and alternatives for the proposed anesthesia with the patient or authorized representative who has indicated his/her understanding and acceptance.   Dental advisory given  Plan Discussed with: Surgeon and CRNA  Anesthesia Plan Comments: (Plan routine monitors, GA- LMA OK, bilateral Pecs blocks for post op analgesia)        Anesthesia Quick Evaluation

## 2014-08-10 NOTE — H&P (Signed)
Lori [name] 07/13/2014 10:15 AM Location: [facility] Patient #: [id number] DOB: 06-13-1965 Undefined / Language: Lori [name] / Race: Undefined Female  History of Present Illness Lori Hines. Lori Starks MD; 07/13/2014 4:13 PM) The patient is a 49 year old female who presents with breast cancer. We're asked to see the patient in consultation by Dr. Luberta [name] to evaluate her for a left-sided breast cancer. The patient is a 49 year old white female who first felt a mass in the upper outer portion of the left breast about 2 months ago. She brought this to her doctor's attention. She was evaluated with mammogram ultrasound and MRI. There was a large area of distortion in the upper outer quadrant of the left breast which measured 9.3 cm by MRI. This was biopsied and came back as an invasive lobular cancer. She was ER and PR positive and HER-2 negative with a Ki-67 of less than 5% she denies any breast pain or discharge from the nipple. She does not take any hormone replacement. Of note her husband is also an old patient of mine who had a rectal procedure that got infected.   Other Problems Lori [name] [place], RMA; 07/13/2014 10:15 AM) Asthma Breast Cancer Hemorrhoids Lump In Breast  Past Surgical History Lori [name] [place], RMA; 07/13/2014 10:15 AM) Breast Biopsy Left. Cesarean Section - Multiple Knee Surgery Left. Oral Surgery  Diagnostic Studies History Lori [name] [place], [place]; 07/13/2014 10:15 AM) Mammogram within last year Pap Smear 1-5 years ago  Social History Lori [name] [place], RMA; 07/13/2014 10:15 AM) Alcohol use Occasional alcohol use. Caffeine use Coffee. No drug use Tobacco use Former smoker.  Family History Lori [name] [place], [place]; 07/13/2014 10:15 AM) Arthritis Mother. Breast Cancer Family Members In General, Mother. Cancer Family Members In General. Cerebrovascular Accident Family Members In General. Colon Polyps Mother. Heart Disease Father. Hypertension  Mother. Ovarian Cancer Family Members In General.  Pregnancy / Birth History Lori [name], [place]; 07/13/2014 10:15 AM) Age at menarche 25 years. Contraceptive History Oral contraceptives. Gravida 2 Maternal age 80-35 Para 2 Regular periods  Review of Systems Lori [name] [place] RMA; 07/13/2014 10:15 AM) General Not Present- Appetite Loss, Chills, Fatigue, Fever, Night Sweats, Weight Gain and Weight Loss. Skin Not Present- Change in Wart/Mole, Dryness, Hives, Jaundice, New Lesions, Non-Healing Wounds, Rash and Ulcer. HEENT Present- Wears glasses/contact lenses. Not Present- Earache, Hearing Loss, Hoarseness, Nose Bleed, Oral Ulcers, Ringing in the Ears, Seasonal Allergies, Sinus Pain, Sore Throat, Visual Disturbances and Yellow Eyes. Breast Present- Breast Mass and Skin Changes. Not Present- Breast Pain and Nipple Discharge. Cardiovascular Not Present- Chest Pain, Difficulty Breathing Lying Down, Leg Cramps, Palpitations, Rapid Heart Rate, Shortness of Breath and Swelling of Extremities. Gastrointestinal Not Present- Abdominal Pain, Bloating, Bloody Stool, Change in Bowel Habits, Chronic diarrhea, Constipation, Difficulty Swallowing, Excessive gas, Gets full quickly at meals, Hemorrhoids, Indigestion, Nausea, Rectal Pain and Vomiting. Female Genitourinary Not Present- Frequency, Nocturia, Painful Urination, Pelvic Pain and Urgency. Musculoskeletal Present- Joint Pain. Not Present- Back Pain, Joint Stiffness, Muscle Pain, Muscle Weakness and Swelling of Extremities. Neurological Not Present- Decreased Memory, Fainting, Headaches, Numbness, Seizures, Tingling, Tremor, Trouble walking and Weakness. Psychiatric Present- Anxiety and Fearful. Not Present- Bipolar, Change in Sleep Pattern, Depression and Frequent crying. Endocrine Not Present- Cold Intolerance, Excessive Hunger, Hair Changes, Heat Intolerance, Hot flashes and New Diabetes.   Physical Exam Lori Dibbles S. Lori Starks MD; 07/13/2014 4:14  PM) General Mental Status-Alert. General Appearance-Consistent with stated age. Hydration-Well hydrated. Voice-Normal.  Head and Neck Head-normocephalic, atraumatic with no lesions or palpable masses. Trachea-midline. Thyroid Gland  Characteristics - normal size and consistency.  Eye Eyeball - Bilateral-Extraocular movements intact. Sclera/Conjunctiva - Bilateral-No scleral icterus.  Chest and Lung Exam Chest and lung exam reveals -quiet, even and easy respiratory effort with no use of accessory muscles and on auscultation, normal breath sounds, no adventitious sounds and normal vocal resonance. Inspection Chest Wall - Normal. Back - normal.  Breast Note: There is a palpable mass in the upper outer quadrant of the left breast that measures about 5-6 cm. It is mobile. There is no palpable mass in the right breast. There is no palpable axillary, supraclavicular, or cervical lymphadenopathy.   Cardiovascular Cardiovascular examination reveals -normal heart sounds, regular rate and rhythm with no murmurs and normal pedal pulses bilaterally.  Abdomen Inspection Inspection of the abdomen reveals - No Hernias. Skin - Scar - no surgical scars. Palpation/Percussion Palpation and Percussion of the abdomen reveal - Soft, Non Tender, No Rebound tenderness, No Rigidity (guarding) and No hepatosplenomegaly. Auscultation Auscultation of the abdomen reveals - Bowel sounds normal.  Neurologic Neurologic evaluation reveals -alert and oriented x 3 with no impairment of recent or remote memory. Mental Status-Normal.  Musculoskeletal Normal Exam - Left-Upper Extremity Strength Normal and Lower Extremity Strength Normal. Normal Exam - Right-Upper Extremity Strength Normal and Lower Extremity Strength Normal.  Lymphatic Head & Neck  General Head & Neck Lymphatics: Bilateral - Description - Normal. Axillary  General Axillary Region: Bilateral - Description -  Normal. Tenderness - Non Tender. Femoral & Inguinal  Generalized Femoral & Inguinal Lymphatics: Bilateral - Description - Normal. Tenderness - Non Tender.    Assessment & Plan Lori Dibbles S. Lori Starks MD; 07/13/2014 4:20 PM) PRIMARY CANCER OF UPPER OUTER QUADRANT OF LEFT FEMALE BREAST (174.4  C50.412) Impression: The patient has a large cancer in the upper outer quadrant of the left breast measuring about 9.3 cm. Given the size of the tumor I think her best option would be mastectomy. Clinically her nodes are negative so she would be a good candidate for sentinel node mapping. Given the size of the tumor there is also a chance that she would require postmastectomy radiation and it is the consensus opinion that she should wait on reconstruction. She is also going to undergo genetic testing given her strong family history. I have discussed with her in detail the risks and benefits of the operation as well as some of the technical aspects and she understands. She would like to think about it for a day or 2 before deciding which operation she would like to do and whether she would like me to do it. If she would like a second opinion we will arrange for that for her.   She has decided on left mastectomy with sentinel node biopsy and right prophylactic mastectomy  Signed by Luella Cook, MD (07/13/2014 4:21 PM)

## 2014-08-10 NOTE — Anesthesia Procedure Notes (Addendum)
Anesthesia Regional Block:  Pectoralis block  Pre-Anesthetic Checklist: ,, timeout performed, Correct Patient, Correct Site, Correct Laterality, Correct Procedure, Correct Position, site marked, Risks and benefits discussed,  Surgical consent,  Pre-op evaluation,  At surgeon's request and post-op pain management  Laterality: Left  Prep: chloraprep       Needles:  Injection technique: Single-shot  Needle Type: Echogenic Stimulator Needle     Needle Length: 9cm 9 cm Needle Gauge: 22 and 22 G    Additional Needles:  Procedures: ultrasound guided (picture in chart) Pectoralis block Narrative:  Start time: 08/10/2014 9:35 AM End time: 08/10/2014 9:44 AM Injection made incrementally with aspirations every 5 mL.  Performed by: Personally  Anesthesiologist: Glennon Mac, [place]  Additional Notes: Pt identified in Holding room.  Monitors applied. Working IV access confirmed. Sterile prep L chest.  #22ga ECHOgenic needle into subserratus space between ribs 4,5 with US guidance.  25cc 0.5% Bupivacaine with 1:200k epi injected incrementally after negative test dose.  Good spread of local anesth. Patient asymptomatic, VSS, no heme aspirated, tolerated well.  Jenita Seashore, MD   Anesthesia Regional Block:  Pectoralis block  Pre-Anesthetic Checklist: ,, timeout performed, Correct Patient, Correct Site, Correct Laterality, Correct Procedure, Correct Position, site marked, Risks and benefits discussed,  Surgical consent,  Pre-op evaluation,  At surgeon's request and post-op pain management  Laterality: Right  Prep: chloraprep       Needles:  Injection technique: Single-shot  Needle Type: Echogenic Stimulator Needle     Needle Length: 9cm 9 cm Needle Gauge: 22 and 22 G    Additional Needles:  Procedures: ultrasound guided (picture in chart) Pectoralis block Narrative:  Start time: 08/10/2014 10:55 AM End time: 08/10/2014 10:10 AM Injection made incrementally with aspirations every 5  mL.  Performed by: Personally  Anesthesiologist: Glennon Mac, [place]  Additional Notes: Pt identified in Holding room.  Monitors applied. Working IV access confirmed. Sterile prep R chest.  #22ga ECHOgenic needle into subserratus space between ribs 4,5.  20cc 0.5% Bupivacaine with 1:200k epi injected incrementally after negative test dose. Good local anesth spread. Patient asymptomatic, VSS, no heme aspirated, tolerated well.  Jenita Seashore, MD   Procedure Name: Intubation Date/Time: 08/10/2014 10:43 AM Performed by: Vennie Homans Pre-anesthesia Checklist: Patient identified, Timeout performed, Emergency Drugs available, Suction available and Patient being monitored Patient Re-evaluated:Patient Re-evaluated prior to inductionOxygen Delivery Method: Circle system utilized Preoxygenation: Pre-oxygenation with 100% oxygen Intubation Type: IV induction Ventilation: Mask ventilation without difficulty LMA: LMA inserted LMA Size: 4.0 Laryngoscope Size: Mac and 3 Grade View: Grade I Tube type: Oral Tube size: 7.0 mm Number of attempts: 2 Airway Equipment and Method: Stylet Placement Confirmation: ETT inserted through vocal cords under direct vision,  breath sounds checked- equal and bilateral and positive ETCO2 Secured at: 21 cm Tube secured with: Tape Dental Injury: Teeth and Oropharynx as per pre-operative assessment

## 2014-08-10 NOTE — Anesthesia Postprocedure Evaluation (Signed)
Anesthesia Post Note  Patient: Lori [name]  Procedure(s) Performed: Procedure(s) (LRB): MASTECTOMY WITH SENTINEL LYMPH NODE BIOPSY (Bilateral)  Anesthesia type: General  Patient location: PACU  Post pain: Pain level controlled  Post assessment: Post-op Vital signs reviewed  Last Vitals: BP 120/65 mmHg  Pulse 57  Temp(Src) 36.3 C (Oral)  Resp 15  Ht 5\' 8"  (1.727 m)  Wt 216 lb (97.977 kg)  BMI 32.85 kg/m2  SpO2 99%  LMP 06/29/2014  Post vital signs: Reviewed  Level of consciousness: sedated  Complications: No apparent anesthesia complications

## 2014-08-10 NOTE — Progress Notes (Signed)
Report given to steve shaw rn as caregiver 

## 2014-08-11 ENCOUNTER — Encounter (HOSPITAL_COMMUNITY): Payer: Self-pay | Admitting: General Surgery

## 2014-08-11 DIAGNOSIS — C50912 Malignant neoplasm of unspecified site of left female breast: Secondary | ICD-10-CM | POA: Diagnosis not present

## 2014-08-11 MED ORDER — DOCUSATE SODIUM 100 MG PO CAPS
100.0000 mg | ORAL_CAPSULE | Freq: Two times a day (BID) | ORAL | Status: DC
  Administered 2014-08-11 – 2014-08-12 (×3): 100 mg via ORAL
  Filled 2014-08-11 (×3): qty 1

## 2014-08-11 MED ORDER — HYDROCODONE-ACETAMINOPHEN 5-325 MG PO TABS
1.0000 | ORAL_TABLET | ORAL | Status: DC | PRN
  Administered 2014-08-11 (×3): 2 via ORAL
  Administered 2014-08-11: 1 via ORAL
  Administered 2014-08-12 (×3): 2 via ORAL
  Filled 2014-08-11 (×3): qty 2
  Filled 2014-08-11 (×2): qty 1
  Filled 2014-08-11: qty 2
  Filled 2014-08-11: qty 1
  Filled 2014-08-11: qty 2

## 2014-08-11 MED ORDER — KETOROLAC TROMETHAMINE 30 MG/ML IJ SOLN
30.0000 mg | Freq: Three times a day (TID) | INTRAMUSCULAR | Status: DC
  Administered 2014-08-11: 30 mg via INTRAVENOUS
  Filled 2014-08-11 (×2): qty 1

## 2014-08-11 MED ORDER — ZOLPIDEM TARTRATE 5 MG PO TABS
5.0000 mg | ORAL_TABLET | Freq: Every evening | ORAL | Status: DC | PRN
  Administered 2014-08-11: 5 mg via ORAL
  Filled 2014-08-11: qty 1

## 2014-08-11 MED ORDER — HYDROCODONE-ACETAMINOPHEN 5-325 MG PO TABS: 1.0000 | ORAL_TABLET | ORAL | Status: DC | PRN

## 2014-08-11 MED ORDER — POLYETHYLENE GLYCOL 3350 17 G PO PACK
17.0000 g | PACK | Freq: Every day | ORAL | Status: DC
  Administered 2014-08-11: 17 g via ORAL
  Filled 2014-08-11: qty 1

## 2014-08-11 NOTE — Progress Notes (Signed)
1 Day Post-Op  Subjective: Complains of pain  Objective: Vital signs in last 24 hours: Temp:  [97.3 F (36.3 C)-98.5 F (36.9 C)] 98.5 F (36.9 C) (06/09 0626) Pulse Rate:  [52-98] 66 (06/09 0626) Resp:  [12-20] 18 (06/09 0626) BP: (91-141)/(35-89) 119/50 mmHg (06/09 0626) SpO2:  [95 %-100 %] 95 % (06/09 0626) Weight:  [97.977 kg (216 lb)] 97.977 kg (216 lb) (06/08 0819) Last BM Date: 08/10/14  Intake/Output from previous day: 06/08 0701 - 06/09 0700 In: 5561.3 [P.O.:240; I.V.:5321.3] Out: 355 [Drains:205; Blood:150] Intake/Output this shift: Total I/O In: 1161.3 [P.O.:240; I.V.:921.3] Out: 205 [Drains:205]  Resp: clear to auscultation bilaterally Chest wall: skin flaps look good Cardio: regular rate and rhythm GI: soft, non-tender; bowel sounds normal; no masses,  no organomegaly  Lab Results:  No results for input(Lori [name]): WBC, HGB, HCT, PLT in the last 72 hours. BMET No results for input(Lori [name]): NA, K, CL, CO2, GLUCOSE, BUN, CREATININE, CALCIUM in the last 72 hours. PT/INR No results for input(Lori [name]): LABPROT, INR in the last 72 hours. ABG No results for input(Lori [name]): PHART, HCO3 in the last 72 hours.  Invalid input(Lori [name]): PCO2, PO2  Studies/Results: Nm Sentinel Node Inj-no Rpt (breast)  08/10/2014   CLINICAL DATA: left breast cancer   Sulfur colloid was injected intradermally by the nuclear medicine  technologist for breast cancer sentinel node localization.     Anti-infectives: Anti-infectives    Start     Dose/Rate Route Frequency Ordered Stop   08/10/14 0915  ceFAZolin (ANCEF) IVPB 2 g/50 mL premix     2 g 100 mL/hr over 30 Minutes Intravenous To Surgery 08/09/14 1346 08/10/14 1032      Assessment/Plan: Lori [name]/p Procedure(Lori [name]): MASTECTOMY WITH SENTINEL LYMPH NODE BIOPSY (Bilateral) Advance diet  Change pain meds It looks like she may need another day Teach drain care     Lori Lori [name],Lori Lori [name] 08/11/2014

## 2014-08-11 NOTE — Anesthesia Postprocedure Evaluation (Signed)
  Anesthesia Post-op Note  Patient: Lori [name]  Procedure(s) Performed: Procedure(s): MASTECTOMY WITH SENTINEL LYMPH NODE BIOPSY (Bilateral)  Patient Location: PACU  Anesthesia Type:General  Level of Consciousness: awake, alert  and oriented  Airway and Oxygen Therapy: Patient Spontanous Breathing and Patient connected to nasal cannula oxygen  Post-op Pain: mild  Post-op Assessment: Post-op Vital signs reviewed, Patient's Cardiovascular Status Stable, Respiratory Function Stable, Patent Airway and Pain level controlled              Post-op Vital Signs: stable  Last Vitals:  Filed Vitals:   08/11/14 1410  BP: 126/49  Pulse: 77  Temp: 36.4 C  Resp: 16    Complications: No apparent anesthesia complications

## 2014-08-11 NOTE — Progress Notes (Signed)
CNA reported low BP at 0210 to be 91/47 Right arm, automatic (Dinamap) lying, pulse rate 57, and assisted patient to bathroom.  Pt. did not feel any dizziness.  Rechecked BP manually to be 92/60 right arm, lying and right radial pulse at 60.  Patient states, "I usually have low BPs in the low 782U (systolic) and 23N (diastolic).  Patient states, "I'm ok and do not feel dizzy.  I doze on and off".  Will continue to monitor.

## 2014-08-12 DIAGNOSIS — C50912 Malignant neoplasm of unspecified site of left female breast: Secondary | ICD-10-CM | POA: Diagnosis not present

## 2014-08-12 MED ORDER — ZOLPIDEM TARTRATE 5 MG PO TABS: 5.0000 mg | ORAL_TABLET | Freq: Every evening | ORAL | Status: DC | PRN

## 2014-08-12 MED ORDER — POLYETHYLENE GLYCOL 3350 17 G PO PACK
17.0000 g | PACK | Freq: Every day | ORAL | Status: DC
  Administered 2014-08-12: 17 g via ORAL
  Filled 2014-08-12: qty 1

## 2014-08-12 NOTE — Discharge Summary (Signed)
Patient ID: Lori [name] [id number] 48 y.o. 1965-07-23  Admit date: 08/10/2014  Discharge date and time: No discharge date for patient encounter.  Admitting Physician: Autumn Messing, MD  Discharge Physician: Adin Hector  Admission Diagnoses: Left Breast Cancer  Discharge Diagnoses: same  Operations: Procedure(s): MASTECTOMY WITH SENTINEL LYMPH NODE BIOPSY  Admission Condition: good  Discharged Condition: good  Indication for Admission: . The patient is a 50 year old white female who first felt a mass in the upper outer portion of the left breast about 2 months ago. She brought this to her doctor's attention. She was evaluated with mammogram ultrasound and MRI. There was a large area of distortion in the upper outer quadrant of the left breast which measured 9.3 cm by MRI. This was biopsied and came back as an invasive lobular cancer. She was ER and PR positive and HER-2 negative with a Ki-67 of less than 5% she denies any breast pain or discharge from the nipple. She does not take any hormone replacement. : The patient has a large cancer in the upper outer quadrant of the left breast measuring about 9.3 cm. Given the size of the tumor I think her best option would be mastectomy. Clinically her nodes are negative so she would be a good candidate for sentinel node mapping. Given the size of the tumor there is also a chance that she would require postmastectomy radiation and it is the consensus opinion that she should wait on reconstruction. She is also going to undergo genetic testing given her strong family history. She has decided on left mastectomy with sentinel node biopsy and right prophylactic mastectomy and she is brought to the hospital electively for surgery  Hospital Course: On the day of admission the patient was brought to the operating room and underwent left mastectomy, sentinel lymph node biopsy, lymph node dissection and right prophylactic mastectomy.  The surgery was  uneventful.  Final pathology is pending at the time of this dictation.  On postop day #1 she was stable but had not been moving around well and had a fair amount of pain and needed to remain hospitalized.  On postop day 2 she was feeling better and ready to go home.  She was ambulatory, tolerating diet, voiding without difficulty.  Vital signs were stable.  Examination of the day of discharge revealed that both mastectomy skin flaps looked good.  Skin pink and healthy.  No hematoma or seroma.  Both drains functioning with low to moderate serosanguineous output.  Able to move her shoulders around well.  No sensory deficit in arms.    She was instructed in diet, activities, and wound and drain care.  She was given a prescription for hydrocodone for pain.  She was asked to arrange an appointment see Dr. Marlou Starks in one week.  Consults: None  Significant Diagnostic Studies: Surgical pathology, pending  Treatments: surgery: Left total mastectomy with left axillary sentinel node biopsy, left axillary lymph node dissection, right prophylactic mastectomy.  Disposition: Home  Patient Instructions:    Medication List    TAKE these medications        HYDROcodone-acetaminophen 5-325 MG per tablet  Commonly known as:  NORCO/VICODIN  Take 1-2 tablets by mouth every 4 (four) hours as needed for moderate pain or severe pain.     ibuprofen 200 MG tablet  Commonly known as:  ADVIL,MOTRIN  Take 200 mg by mouth every 6 (six) hours as needed.     tamoxifen 20 MG tablet  Commonly known as:  NOLVADEX  Take 1 tablet (20 mg total) by mouth daily.        Activity: Ambulate as much as possible.  No driving.  No sports or lifting Diet: regular diet Wound Care: as directed  Follow-up:  With Dr. Marlou Starks in 1 week.  Signed: Edsel Petrin. Dalbert Batman, M.D., FACS General and minimally invasive surgery Breast and Colorectal Surgery  08/12/2014, 5:41 AM

## 2014-08-12 NOTE — Progress Notes (Signed)
Discharge paperwork given to patient. No questions verbalized. Prescriptions given to patient. Patient taken to main entrance to discharge.

## 2014-08-25 ENCOUNTER — Telehealth: Payer: Self-pay

## 2014-08-25 ENCOUNTER — Telehealth: Payer: Self-pay | Admitting: *Deleted

## 2014-08-25 ENCOUNTER — Ambulatory Visit (HOSPITAL_BASED_OUTPATIENT_CLINIC_OR_DEPARTMENT_OTHER): Payer: BLUE CROSS/BLUE SHIELD | Admitting: Oncology

## 2014-08-25 ENCOUNTER — Other Ambulatory Visit: Payer: Self-pay | Admitting: General Surgery

## 2014-08-25 ENCOUNTER — Telehealth: Payer: Self-pay | Admitting: Oncology

## 2014-08-25 VITALS — BP 105/57 | HR 77 | Temp 98.2°F | Resp 18 | Ht 68.0 in | Wt 211.1 lb

## 2014-08-25 DIAGNOSIS — Z170 Estrogen receptor positive status [ER+]: Secondary | ICD-10-CM | POA: Diagnosis not present

## 2014-08-25 DIAGNOSIS — C50412 Malignant neoplasm of upper-outer quadrant of left female breast: Secondary | ICD-10-CM | POA: Diagnosis not present

## 2014-08-25 DIAGNOSIS — Z79810 Long term (current) use of selective estrogen receptor modulators (SERMs): Secondary | ICD-10-CM | POA: Diagnosis not present

## 2014-08-25 DIAGNOSIS — D0501 Lobular carcinoma in situ of right breast: Secondary | ICD-10-CM | POA: Diagnosis not present

## 2014-08-25 NOTE — Telephone Encounter (Signed)
-----   Message from Stanford Breed sent at 08/25/2014  2:46 PM EDT ----- Regarding: Pet Scan The Pet Scan for this patient has gone to MD review with [facility].  I have listed below the necessary information for this request.  [phone]  Opt. 1, 1, 1, 2  MD Line.    Alec Mcphee  DOB  [id number]   [employer] policy number:  [id number]   [place]

## 2014-08-25 NOTE — Progress Notes (Signed)
Lori [name]  [phone] [phone]     ID: Lori [name] DOB: Jul 23, 1965  MR#: [id number]  WHQ#:[id number]  Patient Care Team: Lori Shan, MD as PCP - General (Family Medicine) Lori Messing III, MD as Consulting Physician (General Surgery) Lori Cruel, MD as Consulting Physician (Oncology) Lori Gibson, MD as Attending Physician (Radiation Oncology) Lori Germany, RN as Registered Nurse Lori Kaufmann, RN as Registered Nurse Lori Bouche, NP as Nurse Practitioner (Nurse Practitioner) PCP: Lori Simmer, MD GYN: Lori Ache MD OTHER MD:  CHIEF COMPLAINT: Estrogen receptor positive breast cancer  CURRENT TREATMENT: adjuvant chemotherapy   BREAST CANCER HISTORY: From the original intake note:  Lori [name] (who is the daughter of my former patient, Lori [name], herself now 10 years out from her T1 cN0 invasive ductal carcinoma, treated with anti-estrogens only) [facility] April 20 03/23/2014. Breast density was category C. A possible mass in the left breast upper outer quadrant was felt to warrant further evaluation, and on 07/04/2014 the patient underwent left mammography with tomosynthesis and left breast ultrasonography. At this point the patient felt she was able to palpate a mass in the area in question. Tomosynthesis did reveal an irregular mass in the upper outer left breast measuring 2.3 cm, associated on physical exam with a large area of thickening. Ultrasound of the area in question confirmed an irregular mass at the 1:00 position 5 cm from the nipple measuring 2.2 cm. The left axilla was negative sonographically.  Biopsy of the mass in question the same day, 07/04/2014, showed (SAA [phone]) an invasive lobular breast cancer which was estrogen receptor 50% positive with strong staining intensity, progesterone receptor 1% positive with moderate staining intensity, with a proliferation  marker of 1%, and no HER-2 amplification, the signals ratio being 1.19 and the number per cell 1.55.  On 07/08/2014 the patient underwent bilateral breast MRI. This showed no abnormal adenopathy and no abnormality in the right breast. In the left breast however there was a 9.3 area of abnormal enhancement involving all quadrants. Within the upper outer quadrant there was a denser area which measured up to 5.1 cm.  The patient's subsequent history is as detailed below  INTERVAL HISTORY: [facility] today for follow-up of her breast cancer accompanied by her husband Lori [name]. Since her last visit here, she underwent bilateral mastectomies with left axillary lymph node dissection 08/10/2014. This showed (sza 16-02/07/1999) on the right, no evidence of cancer (there was lobular carcinoma in situ only). On the left, there was an 8.2 cm invasive lobular breast cancer, grade 2, with 2 out of a total of 12 sentinel lymph nodes removed positive for macro metastases. Margins were ample. HER-2 was repeated.  REVIEW OF SYSTEMS: Lori [name] did well with the surgery. She still has some chest wall discomfort and tightness. It has been no left upper extremity lymphedema. There was no fever, unusual bleeding, or unusual pain. Her drains are out. A detailed review of systems today was otherwise stable    PAST MEDICAL HISTORY: Past Medical History  Diagnosis Date  . Asthma     last trimester of pregnancy 3 yrs ago-  never had problems since then, no meds  . Breast cancer   . Anxiety     PAST SURGICAL HISTORY: Past Surgical History  Procedure Laterality Date  . Cesarean section      x 2  . Knee arthroscopy Left 07/27/2013    Procedure: LEFT  ARTHROSCOPY KNEE WITH MEDIAL MENISCAL DEBRIDEMENT;  Surgeon: Lori Alf, MD;  Location: [facility];  Service: Orthopedics;  Laterality: Left;  Marland Kitchen Mastectomy w/ sentinel node biopsy Left 08/10/2014  . Mastectomy w/ sentinel node biopsy Bilateral 08/10/2014    Procedure: MASTECTOMY  WITH SENTINEL LYMPH NODE BIOPSY;  Surgeon: Lori Messing III, MD;  Location: [facility];  Service: General;  Laterality: Bilateral;    FAMILY HISTORY Family History  Problem Relation Age of Onset  . Breast cancer Mother 58  . Lymphoma Maternal Aunt 71  . Uterine cancer Paternal Aunt 45  . Uterine cancer Paternal Grandmother 19  . Ovarian cancer Other 11    mat great aunt through [facility]  . Ovarian cancer Maternal Grandmother 4  The patient's father died with congestive heart failure at the age of 14. The patient's mother is living, age 64. She was diagnosed with breast cancer at age 33. The patient's paternal grandmother was diagnosed with uterine cancer at age 105 and her daughter, the patient's paternal aunt, also with uterine cancer at age 78. On the mother's side one maternal aunt was diagnosed with lymphoma and the other with throat cancer. A maternal great aunt at age 56 was diagnosed with ovarian cancer.   GYNECOLOGIC HISTORY:  Patient's last menstrual period was 08/01/2014.  menarche age 67, first live birth age 56, which the patient is aware double as the risk of breast cancer. She is GX P2. She still having regular periods. 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 Her husband Lori [name] (goes by "Lori [name]") works in Engineer, technical sales, although currently he is looking for work. Their children are Lori [name] 12 and Lori [name] 3. The patient is not a church attender    ADVANCED DIRECTIVES: Not in place   HEALTH MAINTENANCE: History  Substance Use Topics  . Smoking status: Former Smoker -- 1.00 packs/day for 8 years    Types: Cigarettes    Quit date: 07/20/2000  . Smokeless tobacco: Never Used  . Alcohol Use: Yes     Comment: socially     Colonoscopy:  PAP:  Bone density:  Lipid panel:  No Known Allergies  Current Outpatient Prescriptions  Medication Sig Dispense Refill  . HYDROcodone-acetaminophen (NORCO/VICODIN) 5-325  MG per tablet Take 1-2 tablets by mouth every 4 (four) hours as needed for moderate pain or severe pain. 50 tablet 0  . ibuprofen (ADVIL,MOTRIN) 200 MG tablet Take 200 mg by mouth every 6 (six) hours as needed.    . zolpidem (AMBIEN) 5 MG tablet Take 1 tablet (5 mg total) by mouth at bedtime as needed for sleep. 10 tablet 0   No current facility-administered medications for this visit.    OBJECTIVE: young White woman who appears stated age 61 Vitals:   08/25/14 0803  BP: 105/57  Pulse: 77  Temp: 98.2 F (36.8 C)  Resp: 18     Body mass index is 32.1 kg/(m^2).    ECOG FS:1 - Symptomatic but completely ambulatory   Sclerae unicteric, pupils round and equal Oropharynx clear and moist-- no thrush or other lesions No cervical or supraclavicular adenopathy Lungs no rales or rhonchi Heart regular rate and rhythm Abd soft, nontender, positive bowel sounds MSK no focal spinal tenderness, no leftupper extremity lymphedema Neuro: nonfocal, well oriented, appropriate affect Breasts: status post recent bilateral mastectomies. The incisions are healing nicely. There is no dehiscence, erythema, or swelling. Both axillae are benign.  LAB RESULTS:  CMP     Component Value Date/Time   NA 139 08/03/2014 1022   NA 140 07/13/2014 1226   K 4.1 08/03/2014 1022   K 4.2 07/13/2014 1226   CL 106 08/03/2014 1022   CO2 25 08/03/2014 1022   CO2 25 07/13/2014 1226   GLUCOSE 89 08/03/2014 1022   GLUCOSE 78 07/13/2014 1226   BUN 10 08/03/2014 1022   BUN 13.3 07/13/2014 1226   CREATININE 0.68 08/03/2014 1022   CREATININE 0.7 07/13/2014 1226   CALCIUM 9.3 08/03/2014 1022   CALCIUM 9.4 07/13/2014 1226   PROT 6.8 07/13/2014 1226   ALBUMIN 3.9 07/13/2014 1226   AST 21 07/13/2014 1226   ALT 30 07/13/2014 1226   ALKPHOS 72 07/13/2014 1226   BILITOT 0.26 07/13/2014 1226   GFRNONAA >60 08/03/2014 1022   GFRAA >60 08/03/2014 1022    INo results found for: SPEP, UPEP  Lab Results  Component  Value Date   WBC 7.0 08/03/2014   NEUTROABS 5.6 07/13/2014   HGB 14.1 08/03/2014   HCT 42.4 08/03/2014   MCV 87.4 08/03/2014   PLT 250 08/03/2014      Chemistry      Component Value Date/Time   NA 139 08/03/2014 1022   NA 140 07/13/2014 1226   K 4.1 08/03/2014 1022   K 4.2 07/13/2014 1226   CL 106 08/03/2014 1022   CO2 25 08/03/2014 1022   CO2 25 07/13/2014 1226   BUN 10 08/03/2014 1022   BUN 13.3 07/13/2014 1226   CREATININE 0.68 08/03/2014 1022   CREATININE 0.7 07/13/2014 1226      Component Value Date/Time   CALCIUM 9.3 08/03/2014 1022   CALCIUM 9.4 07/13/2014 1226   ALKPHOS 72 07/13/2014 1226   AST 21 07/13/2014 1226   ALT 30 07/13/2014 1226   BILITOT 0.26 07/13/2014 1226       No results found for: LABCA2  No components found for: LABCA125  No results for input(s): INR in the last 168 hours.  Urinalysis No results found for: COLORURINE, APPEARANCEUR, LABSPEC, PHURINE, GLUCOSEU, HGBUR, BILIRUBINUR, KETONESUR, PROTEINUR, UROBILINOGEN, NITRITE, LEUKOCYTESUR  STUDIES: Nm Sentinel Node Inj-no Rpt (breast)  08/10/2014   CLINICAL DATA: left breast cancer   Sulfur colloid was injected intradermally by the nuclear medicine  technologist for breast cancer sentinel node localization.     ASSESSMENT: 49 [place] post left breast biopsy 07/04/2014 for a clinical T3 N0, stage IIA invasive lobular breast cancer, grade 2, estrogen receptor positive, progesterone receptor 1% "positive", with an MIB-1 of less than 5% and no HER-2 amplification  (1) genetics testing 07/29/2014 through the [employer] gene panel offered by [employer] found no deleterious mutations in ATM, BARD1, BRCA1, BRCA2, BRIP1, CDH1, CHEK2, EPCAM, MLH1, MRE11A, MSH2, MSH6, MUTYH, NBN, NF1, PALB2, PMS2, PTEN, RAD50, RAD51C, RAD51D, SMARCA4, STK11, or TP53.   (2) status post bilateral mastectomies 08/10/2014 showing  (a) on the right, lobular carcinoma in situ  (b) on the  left, a pT3 pN1, stage IIIA invasive lobular breast cancer, HER-2 repeated  (3) chemotherapy will consist of standard doxorubicin and cyclophosphamide in dose dense fashion 4 followed by paclitaxel weekly 12  (4) radiation to follow chemotherapy as appropriate  (5) tamoxifen was started neoadjuvantly on 07/13/2014 given the likely delay in definitive surgery while genetics results are pending; this will be held during chemotherapy   PLAN: I spent approximately an hour with Lori [name] and her husband Lori [name] going over her  situation. She understands the tumor turns out to be rather large and to involve 2 lymph nodes. NCCN guidelines do not recommend sending an Oncotype when lymph nodes are positive. The data there is felt to be not strong enough to safely allow for a patient like [place] to avoid chemotherapy. The same may be said of Mammaprint. That study has been presented, but not published. We discussed requesting that test, but my recommendation regardless of results would be that she needs adjuvant chemotherapy. Accordingly neither an Oncotype nor Mammaprint will be sent.  She understands the lobular carcinoma in situ found on the right side is not a cancerous much as a marker of cancer risk. That does not affect her treatment one way or the other at this point.  We then discussed standard chemotherapy which in this case will consist of doxorubicin and cyclophosphamide in dose dense fashion 4, followed by paclitaxel weekly 12. She will need a port placed. She will have an echocardiogram prior to the start of therapy. She will also come to chemotherapy school.  On July 7 she will meet with our advanced practice practitioner to discuss the results of the echo and port procedure and to review how to take her supportive medications. The target date to start chemotherapy will be July 12.  Lori [name] has a good understanding of the overall plan. She agrees with it. She knows the goal of treatment in her case is  cure. She will call with any problems that may develop before her next visit here.    Lori Cruel, MD   08/25/2014 8:42 AM Medical Oncology and Hematology [facility] [address] [place], [place] [address] Tel. [phone]    Fax. [phone]

## 2014-08-25 NOTE — Telephone Encounter (Signed)
Gave avs & calendar for July/August. Sent MW message to schedule 07/12 chemo.

## 2014-08-25 NOTE — Telephone Encounter (Signed)
Per staff message and POF I have scheduled appts. Advised scheduler of appts. JMW  

## 2014-08-26 ENCOUNTER — Other Ambulatory Visit: Payer: Self-pay | Admitting: Oncology

## 2014-08-29 ENCOUNTER — Telehealth: Payer: Self-pay | Admitting: Oncology

## 2014-08-29 ENCOUNTER — Telehealth: Payer: Self-pay | Admitting: *Deleted

## 2014-08-29 NOTE — Telephone Encounter (Signed)
Per staff message and POF I have scheduled appts. Advised scheduler of appts. JMW  

## 2014-08-29 NOTE — Telephone Encounter (Signed)
Lft msg for pt confirming labs/ov per 06/25 POF, mailed out schedule to pt... KJ

## 2014-08-30 NOTE — Progress Notes (Signed)
The patient's final prognostic panel showed her tumor to be HER-2 not amplified, with a signals ratio of 1.21 and copy per cell 2.00

## 2014-08-31 ENCOUNTER — Ambulatory Visit (HOSPITAL_COMMUNITY)
Admission: RE | Admit: 2014-08-31 | Discharge: 2014-08-31 | Disposition: A | Discharge status: Home / Self Care | Payer: BLUE CROSS/BLUE SHIELD | Source: Ambulatory Visit | Attending: Oncology | Admitting: Oncology

## 2014-08-31 ENCOUNTER — Other Ambulatory Visit: Payer: Self-pay | Admitting: Oncology

## 2014-08-31 ENCOUNTER — Encounter (HOSPITAL_COMMUNITY)
Admission: RE | Admit: 2014-08-31 | Discharge: 2014-08-31 | Disposition: A | Discharge status: Home / Self Care | Payer: BLUE CROSS/BLUE SHIELD | Source: Ambulatory Visit | Attending: Oncology | Admitting: Oncology

## 2014-08-31 DIAGNOSIS — K869 Disease of pancreas, unspecified: Secondary | ICD-10-CM | POA: Diagnosis not present

## 2014-08-31 DIAGNOSIS — C50412 Malignant neoplasm of upper-outer quadrant of left female breast: Secondary | ICD-10-CM

## 2014-08-31 DIAGNOSIS — Z9013 Acquired absence of bilateral breasts and nipples: Secondary | ICD-10-CM | POA: Insufficient documentation

## 2014-08-31 MED ORDER — IOHEXOL 300 MG/ML  SOLN
100.0000 mL | Freq: Once | INTRAMUSCULAR | Status: AC | PRN
  Administered 2014-08-31: 100 mL via INTRAVENOUS

## 2014-08-31 MED ORDER — TECHNETIUM TC 99M MEDRONATE IV KIT
25.0000 | PACK | Freq: Once | INTRAVENOUS | Status: AC | PRN
  Administered 2014-08-31: 25 via INTRAVENOUS

## 2014-08-31 NOTE — Progress Notes (Unsigned)
Called patient and gave her results of scans, which are favorable

## 2014-09-01 ENCOUNTER — Other Ambulatory Visit: Payer: Self-pay

## 2014-09-01 ENCOUNTER — Ambulatory Visit: Payer: BLUE CROSS/BLUE SHIELD | Attending: General Surgery | Admitting: Physical Therapy

## 2014-09-01 DIAGNOSIS — Z9189 Other specified personal risk factors, not elsewhere classified: Secondary | ICD-10-CM | POA: Insufficient documentation

## 2014-09-01 DIAGNOSIS — M25612 Stiffness of left shoulder, not elsewhere classified: Secondary | ICD-10-CM | POA: Diagnosis present

## 2014-09-01 DIAGNOSIS — M25611 Stiffness of right shoulder, not elsewhere classified: Secondary | ICD-10-CM | POA: Diagnosis present

## 2014-09-01 NOTE — Patient Instructions (Signed)
Cane Overhead - Supine  Hold cane at thighs with both hands, extend arms straight over head. Hold 2___ seconds. Repeat __5_ times. Do __2_ times per day.

## 2014-09-02 ENCOUNTER — Other Ambulatory Visit: Payer: Self-pay | Admitting: Oncology

## 2014-09-02 NOTE — Therapy (Signed)
[place], [place], [address] Phone: [phone]   Fax:  [phone]  Physical Therapy Evaluation  Patient Details  Name: Lori [name] MRN: [id number] Date of Birth: [birth year] Referring Provider:  Jovita Kussmaul, MD  Encounter Date: 09/01/2014      PT End of Session - 09/02/14 0755    Visit Number 1   Number of Visits 9   Date for PT Re-Evaluation 10/03/14   PT Start Time 1100   PT Stop Time 1145   PT Time Calculation (min) 45 min      Past Medical History  Diagnosis Date  . Asthma     last trimester of pregnancy 3 yrs ago-  never had problems since then, no meds  . Breast cancer   . Anxiety     Past Surgical History  Procedure Laterality Date  . Cesarean section      x 2  . Knee arthroscopy Left 07/27/2013    Procedure: LEFT ARTHROSCOPY KNEE WITH MEDIAL MENISCAL DEBRIDEMENT;  Surgeon: Gearlean Alf, MD;  Location: [facility];  Service: Orthopedics;  Laterality: Left;  Marland Kitchen Mastectomy w/ sentinel node biopsy Left 08/10/2014  . Mastectomy w/ sentinel node biopsy Bilateral 08/10/2014    Procedure: MASTECTOMY WITH SENTINEL LYMPH NODE BIOPSY;  Surgeon: [name], MD;  Location: [facility];  Service: General;  Laterality: Bilateral;    There were no vitals filed for this visit.  Visit Diagnosis:  Stiffness of joint, shoulder region, left  At risk for lymphedema  Stiffness of joint, shoulder region, right      Subjective Assessment - 09/01/14 1111    Subjective "i am cleared to have physical theapy"    Pertinent History She was diagnosed 07/05/14 with left invasive lobular cancer that is ER/PR positive and HER2 negative.  It has a Ki67 of 1% and measures 2.2 cm on ultrasound but 9.3 cm total enhancement on MRI. Had bilateral mastectomy on June 8  with ALND ( "all of them") on left side, none removed on right.  Plans to have chemotherapy to start at the end of July with radiation to follow.   Considering eventual reconstruction    Patient Stated Goals get more range of motion in shoulders and back, wants to get loosened   Currently in Pain? Yes   Pain Score 4    Pain Location Chest  under left arm is numb   Pain Orientation Lateral;Right;Left   Pain Descriptors / Indicators Sore   Pain Type Surgical pain   Pain Onset 1 to 4 weeks ago   Pain Frequency Intermittent   Pain Relieving Factors tylenol an movement             [facility] PT Assessment - 09/02/14 0001    Assessment   Medical Diagnosis Left breast cancer   Onset Date/Surgical Date 07/05/14   Precautions   Precautions Other (comment)  Active breast cancer   Restrictions   Weight Bearing Restrictions No   Balance Screen   Has the patient fallen in the past 6 months No   Has the patient had a decrease in activity level because of a fear of falling?  No   Is the patient reluctant to leave their home because of a fear of falling?  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  Pt comes in wearing post op compression smocked bandeau.edema above and below incisions especially on right    Skin Integrity glue over incision   Observation/Other Assessments-Edema    Edema Circumferential   Sensation   Light Touch Appears Intact   Coordination   Gross Motor Movements are Fluid and Coordinated Yes   Posture/Postural Control   Posture/Postural Control No significant limitations   AROM    Right Shoulder Extension 60 Degrees   Right Shoulder Flexion 104 Degrees   Right Shoulder ABduction 86 Degrees   Right Shoulder Internal Rotation 65 Degrees   Right Shoulder External Rotation 85 Degrees   Left Shoulder Extension 60 Degrees   Left Shoulder Flexion 100 Degrees   Left Shoulder ABduction 72 Degrees   Left Shoulder Internal Rotation 62 Degrees   Left Shoulder External Rotation 90 Degrees   Strength   Overall Strength Deficits  decreased in both shoulders to ~3-/5 due to pain            LYMPHEDEMA/ONCOLOGY QUESTIONNAIRE - 09/01/14 1129    Right Upper Extremity Lymphedema   10 cm Proximal to Olecranon Process 35.5 cm   Olecranon Process 26.4 cm   10 cm Proximal to Ulnar Styloid Process 24.9 cm   Just Proximal to Ulnar Styloid Process 16.2 cm   Across Hand at [employer] 19.7 cm   At [place] of 2nd Digit 6 cm   Left Upper Extremity Lymphedema   10 cm Proximal to Olecranon Process 33.5 cm   Olecranon Process 25 cm   10 cm Proximal to Ulnar Styloid Process 23 cm   Just Proximal to Ulnar Styloid Process 15.5 cm   Across Hand at [employer] 18.8 cm   At [place] of 2nd Digit 5.7 cm           Quick Dash - 09/02/14 0001    Open a tight or new jar Mild difficulty   Do heavy household chores (wash walls, wash floors) Severe difficulty   Carry a shopping bag or briefcase Mild difficulty   Wash your back Severe difficulty   Use a knife to cut food Mild difficulty   Recreational activities in which you take some force or impact through your arm, shoulder, or hand (golf, hammering, tennis) Moderate difficulty   During the past week, to what extent has your arm, shoulder or hand problem interfered with your normal social activities with family, friends, neighbors, or groups? Modererately   During the past week, to what extent has your arm, shoulder or hand problem limited your work or other regular daily activities Quite a bit   Arm, shoulder, or hand pain. Moderate    Tingling (pins and needles) in your arm, shoulder, or hand None   Difficulty Sleeping Mild difficulty   DASH Score 43.18 %             [facility] Adult PT Treatment/Exercise - 09/02/14 0001    Shoulder Exercises: Supine   Other Supine Exercises supine cane for flexion x 5 reps                PT Education -  09/02/14 0756    Education provided Yes   Education Details supine cane exercise for flexion   Person(s) Educated Patient   Methods Explanation;Demonstration;Handout   Comprehension Verbalized understanding;Returned demonstration              Breast Clinic Goals - 07/13/14 1648    Patient will be able to verbalize understanding of pertinent lymphedema risk reduction practices relevant to her diagnosis specifically related to skin care.   Time 1   Period Days   Status Achieved   Patient will be able to return demonstrate and/or verbalize understanding of the post-op home exercise program related to regaining shoulder range of motion.   Time 1   Period Days   Status Achieved   Patient will be able to verbalize understanding of the importance of attending the postoperative After Breast Cancer Class for further lymphedema risk reduction education and therapeutic exercise.   Time 1   Period Days   Status Achieved          Long Term Clinic Goals - 09/02/14 8938    CC Long Term Goal  #1   Title Patient with verbalize an understanding of lymphedema risk reduction precautions   Time 4   Period Weeks   Status New   CC Long Term Goal  #2   Title Patient will be independent in a basic home exercise program   Time 4   Period Weeks   Status New   CC Long Term Goal  #3   Title Patient will be knowledgable  in strength ABC  home exercise program and how to progress weight when she is ready    Time 4   Period Weeks   Status New   CC Long Term Goal  #4   Title Patient will improve shoulder flexion range of motion to 150 degrees to perform activities of daily  living and household chores with greater ease   Time 4   Period Weeks   Status New   CC Long Term Goal  #5   Title Patient will improve left shoulder abduction to 120 degrees so that she can achieve position needed for radiation therapy.   Time 4   Period Weeks   Status New   CC Long Term Goal  #6   Title Patient will decrease the DASH score to <  25  to demonstrate increased functional use of upper extremity   Baseline 43.18   Time 4   Period Weeks   Status New   Additional Goals   Additional Goals Yes            Plan - 09/02/14 0757    Clinical Impression Statement post bilateral mastectomy with left ALND with high risk for lymphedema who will benefit from PT to return to UE functional independence and learn preventive strategies.  Her right arm was bigger today, but she says she has 'bad veins" and had to have IVs yesterday for testing procedures she knows her arm was swollen after that.  Provided a long piece of tg soft for support to right arm with long folded overlap as her body type as larger upper arm are that may be more suseptible  for swelling.  She knows to wear this just for symptom management, not to allow it to roll and to remove it at any discomfortn   Pt will benefit from skilled therapeutic intervention in order to improve on the following deficits Decreased knowledge of precautions;Decreased strength;Decreased knowledge of use of DME;Decreased  range of motion;Pain;Impaired UE functional use;Increased edema   Rehab Potential Excellent   Clinical Impairments Affecting Rehab Potential recent surgery with ALND on left    PT Frequency 2x / week   PT Duration 4 weeks   PT Treatment/Interventions ADLs/Self Care Home Management;DME Instruction;Manual techniques;Manual lymph drainage;Therapeutic activities;Therapeutic exercise;Compression bandaging;Passive range of motion;Patient/family education;Taping   PT Next Visit Plan Begin manual lymph drainage and instruction,check  to see if tg soft has been effective  A/AA/PROM to both shoulders. Make sure she has signed up for ABC class, advance home program   Recommended Other Services ABC class   Consulted and Agree with Plan of Care Patient         Problem List Patient Active Problem List   Diagnosis Date Noted  . Breast cancer 07/14/2014  . Family history of malignant neoplasm of breast 07/14/2014  . Family history of malignant neoplasm of ovary 07/14/2014  . Family history of uterine cancer 07/14/2014  . Breast cancer of upper-outer quadrant of left female breast 07/07/2014  . Acute medial meniscal tear 07/27/2013   Donato Heinz. Owens Shark, PT  09/02/2014, 9:21 AM  [place], [place], [address] Phone: [phone]   Fax:  [phone]

## 2014-09-06 ENCOUNTER — Encounter (HOSPITAL_BASED_OUTPATIENT_CLINIC_OR_DEPARTMENT_OTHER): Payer: Self-pay | Admitting: *Deleted

## 2014-09-07 ENCOUNTER — Ambulatory Visit (HOSPITAL_BASED_OUTPATIENT_CLINIC_OR_DEPARTMENT_OTHER): Payer: BLUE CROSS/BLUE SHIELD | Admitting: Anesthesiology

## 2014-09-07 ENCOUNTER — Ambulatory Visit (HOSPITAL_COMMUNITY): Payer: BLUE CROSS/BLUE SHIELD

## 2014-09-07 ENCOUNTER — Encounter (HOSPITAL_BASED_OUTPATIENT_CLINIC_OR_DEPARTMENT_OTHER)
Admission: RE | Disposition: A | Discharge status: Home / Self Care | Payer: Self-pay | Source: Ambulatory Visit | Attending: General Surgery

## 2014-09-07 ENCOUNTER — Encounter (HOSPITAL_BASED_OUTPATIENT_CLINIC_OR_DEPARTMENT_OTHER): Payer: Self-pay | Admitting: Anesthesiology

## 2014-09-07 ENCOUNTER — Ambulatory Visit (HOSPITAL_BASED_OUTPATIENT_CLINIC_OR_DEPARTMENT_OTHER)
Admission: RE | Admit: 2014-09-07 | Discharge: 2014-09-07 | Disposition: A | Discharge status: Home / Self Care | Payer: BLUE CROSS/BLUE SHIELD | Source: Ambulatory Visit | Attending: General Surgery | Admitting: General Surgery

## 2014-09-07 DIAGNOSIS — J45909 Unspecified asthma, uncomplicated: Secondary | ICD-10-CM | POA: Insufficient documentation

## 2014-09-07 DIAGNOSIS — Z803 Family history of malignant neoplasm of breast: Secondary | ICD-10-CM | POA: Diagnosis not present

## 2014-09-07 DIAGNOSIS — C50912 Malignant neoplasm of unspecified site of left female breast: Secondary | ICD-10-CM | POA: Diagnosis present

## 2014-09-07 DIAGNOSIS — C50412 Malignant neoplasm of upper-outer quadrant of left female breast: Secondary | ICD-10-CM | POA: Diagnosis not present

## 2014-09-07 DIAGNOSIS — Z95828 Presence of other vascular implants and grafts: Secondary | ICD-10-CM

## 2014-09-07 DIAGNOSIS — Z170 Estrogen receptor positive status [ER+]: Secondary | ICD-10-CM | POA: Insufficient documentation

## 2014-09-07 DIAGNOSIS — Z87891 Personal history of nicotine dependence: Secondary | ICD-10-CM | POA: Insufficient documentation

## 2014-09-07 DIAGNOSIS — Z419 Encounter for procedure for purposes other than remedying health state, unspecified: Secondary | ICD-10-CM

## 2014-09-07 HISTORY — PX: PORTACATH PLACEMENT: SHX2246

## 2014-09-07 SURGERY — INSERTION, TUNNELED CENTRAL VENOUS DEVICE, WITH PORT
Anesthesia: General | Site: Chest | Laterality: Right

## 2014-09-07 MED ORDER — BUPIVACAINE-EPINEPHRINE (PF) 0.25% -1:200000 IJ SOLN
INTRAMUSCULAR | Status: AC
  Filled 2014-09-07: qty 30

## 2014-09-07 MED ORDER — ONDANSETRON HCL 4 MG/2ML IJ SOLN: 4.0000 mg | Freq: Once | INTRAMUSCULAR | Status: DC | PRN

## 2014-09-07 MED ORDER — PROPOFOL 10 MG/ML IV BOLUS
INTRAVENOUS | Status: DC | PRN
  Administered 2014-09-07: 200 mg via INTRAVENOUS

## 2014-09-07 MED ORDER — BUPIVACAINE HCL (PF) 0.25 % IJ SOLN
INTRAMUSCULAR | Status: AC
  Filled 2014-09-07: qty 30

## 2014-09-07 MED ORDER — LACTATED RINGERS IV SOLN
INTRAVENOUS | Status: DC
  Administered 2014-09-07 (×2): via INTRAVENOUS

## 2014-09-07 MED ORDER — LIDOCAINE HCL (CARDIAC) 20 MG/ML IV SOLN
INTRAVENOUS | Status: DC | PRN
  Administered 2014-09-07: 40 mg via INTRAVENOUS

## 2014-09-07 MED ORDER — OXYCODONE HCL 5 MG/5ML PO SOLN: 5.0000 mg | Freq: Once | ORAL | Status: DC | PRN

## 2014-09-07 MED ORDER — BUPIVACAINE HCL (PF) 0.25 % IJ SOLN
INTRAMUSCULAR | Status: DC | PRN
  Administered 2014-09-07: 7 mL

## 2014-09-07 MED ORDER — HYDROMORPHONE HCL 1 MG/ML IJ SOLN: 0.2500 mg | INTRAMUSCULAR | Status: DC | PRN

## 2014-09-07 MED ORDER — MIDAZOLAM HCL 2 MG/2ML IJ SOLN
INTRAMUSCULAR | Status: AC
  Filled 2014-09-07: qty 2

## 2014-09-07 MED ORDER — FENTANYL CITRATE (PF) 100 MCG/2ML IJ SOLN
50.0000 ug | INTRAMUSCULAR | Status: AC | PRN
  Administered 2014-09-07 (×2): 25 ug via INTRAVENOUS
  Administered 2014-09-07: 100 ug via INTRAVENOUS

## 2014-09-07 MED ORDER — SCOPOLAMINE 1 MG/3DAYS TD PT72: 1.0000 | MEDICATED_PATCH | Freq: Once | TRANSDERMAL | Status: DC | PRN

## 2014-09-07 MED ORDER — HEPARIN (PORCINE) IN NACL 2-0.9 UNIT/ML-% IJ SOLN
INTRAMUSCULAR | Status: DC | PRN
  Administered 2014-09-07: 1 via INTRAVENOUS

## 2014-09-07 MED ORDER — CHLORHEXIDINE GLUCONATE 4 % EX LIQD: 1.0000 "application " | Freq: Once | CUTANEOUS | Status: DC

## 2014-09-07 MED ORDER — HEPARIN SOD (PORK) LOCK FLUSH 100 UNIT/ML IV SOLN
INTRAVENOUS | Status: AC
  Filled 2014-09-07: qty 5

## 2014-09-07 MED ORDER — TRAMADOL HCL 50 MG PO TABS: 50.0000 mg | ORAL_TABLET | Freq: Four times a day (QID) | ORAL | Status: DC | PRN

## 2014-09-07 MED ORDER — GLYCOPYRROLATE 0.2 MG/ML IJ SOLN: 0.2000 mg | Freq: Once | INTRAMUSCULAR | Status: DC | PRN

## 2014-09-07 MED ORDER — CEFAZOLIN SODIUM-DEXTROSE 2-3 GM-% IV SOLR
INTRAVENOUS | Status: AC
  Filled 2014-09-07: qty 50

## 2014-09-07 MED ORDER — FENTANYL CITRATE (PF) 100 MCG/2ML IJ SOLN
INTRAMUSCULAR | Status: AC
  Filled 2014-09-07: qty 4

## 2014-09-07 MED ORDER — ONDANSETRON HCL 4 MG/2ML IJ SOLN
INTRAMUSCULAR | Status: DC | PRN
  Administered 2014-09-07: 4 mg via INTRAVENOUS

## 2014-09-07 MED ORDER — OXYCODONE HCL 5 MG PO TABS: 5.0000 mg | ORAL_TABLET | Freq: Once | ORAL | Status: DC | PRN

## 2014-09-07 MED ORDER — MIDAZOLAM HCL 2 MG/2ML IJ SOLN
1.0000 mg | INTRAMUSCULAR | Status: DC | PRN
  Administered 2014-09-07: 2 mg via INTRAVENOUS

## 2014-09-07 MED ORDER — DEXAMETHASONE SODIUM PHOSPHATE 4 MG/ML IJ SOLN
INTRAMUSCULAR | Status: DC | PRN
  Administered 2014-09-07: 10 mg via INTRAVENOUS

## 2014-09-07 MED ORDER — HEPARIN SOD (PORK) LOCK FLUSH 100 UNIT/ML IV SOLN
INTRAVENOUS | Status: DC | PRN
  Administered 2014-09-07: 500 [IU] via INTRAVENOUS

## 2014-09-07 MED ORDER — CEFAZOLIN SODIUM-DEXTROSE 2-3 GM-% IV SOLR
2.0000 g | INTRAVENOUS | Status: AC
  Administered 2014-09-07: 2 g via INTRAVENOUS

## 2014-09-07 MED ORDER — HEPARIN (PORCINE) IN NACL 2-0.9 UNIT/ML-% IJ SOLN
INTRAMUSCULAR | Status: AC
  Filled 2014-09-07: qty 500

## 2014-09-07 SURGICAL SUPPLY — 48 items
BAG DECANTER FOR FLEXI CONT (MISCELLANEOUS) ×3 IMPLANT
BLADE SURG 15 STRL LF DISP TIS (BLADE) ×1 IMPLANT
BLADE SURG 15 STRL SS (BLADE) ×2
CANISTER SUCT 1200ML W/VALVE (MISCELLANEOUS) IMPLANT
CHLORAPREP W/TINT 26ML (MISCELLANEOUS) ×3 IMPLANT
CLEANER CAUTERY TIP 5X5 PAD (MISCELLANEOUS) ×1 IMPLANT
COVER BACK TABLE 60X90IN (DRAPES) ×3 IMPLANT
COVER MAYO STAND STRL (DRAPES) ×3 IMPLANT
DECANTER SPIKE VIAL GLASS SM (MISCELLANEOUS) IMPLANT
DRAPE C-ARM 42X72 X-RAY (DRAPES) ×3 IMPLANT
DRAPE LAPAROSCOPIC ABDOMINAL (DRAPES) ×3 IMPLANT
DRAPE UTILITY XL STRL (DRAPES) ×3 IMPLANT
ELECT REM PT RETURN 9FT ADLT (ELECTROSURGICAL) ×3
ELECTRODE REM PT RTRN 9FT ADLT (ELECTROSURGICAL) ×1 IMPLANT
GLOVE BIO SURGEON STRL SZ 6.5 (GLOVE) ×2 IMPLANT
GLOVE BIO SURGEON STRL SZ7.5 (GLOVE) ×3 IMPLANT
GLOVE BIO SURGEONS STRL SZ 6.5 (GLOVE) ×1
GLOVE BIOGEL M 7.0 STRL (GLOVE) ×6 IMPLANT
GLOVE BIOGEL PI IND STRL 7.5 (GLOVE) ×1 IMPLANT
GLOVE BIOGEL PI INDICATOR 7.5 (GLOVE) ×2
GOWN STRL REUS W/ TWL LRG LVL3 (GOWN DISPOSABLE) ×2 IMPLANT
GOWN STRL REUS W/TWL LRG LVL3 (GOWN DISPOSABLE) ×4
IV KIT MINILOC 20X1 SAFETY (NEEDLE) IMPLANT
KIT PORT POWER 8FR ISP CVUE (Catheter) ×3 IMPLANT
LIQUID BAND (GAUZE/BANDAGES/DRESSINGS) ×3 IMPLANT
NDL SAFETY ECLIPSE 18X1.5 (NEEDLE) IMPLANT
NEEDLE HYPO 18GX1.5 SHARP (NEEDLE)
NEEDLE HYPO 22GX1.5 SAFETY (NEEDLE) IMPLANT
NEEDLE HYPO 25X1 1.5 SAFETY (NEEDLE) ×3 IMPLANT
NEEDLE SPNL 22GX3.5 QUINCKE BK (NEEDLE) IMPLANT
PACK BASIN DAY SURGERY FS (CUSTOM PROCEDURE TRAY) ×3 IMPLANT
PAD CLEANER CAUTERY TIP 5X5 (MISCELLANEOUS) ×2
PENCIL BUTTON HOLSTER BLD 10FT (ELECTRODE) ×3 IMPLANT
SLEEVE SCD COMPRESS KNEE MED (MISCELLANEOUS) IMPLANT
SUT MON AB 4-0 PC3 18 (SUTURE) ×3 IMPLANT
SUT PROLENE 2 0 SH DA (SUTURE) ×3 IMPLANT
SUT SILK 2 0 TIES 17X18 (SUTURE)
SUT SILK 2-0 18XBRD TIE BLK (SUTURE) IMPLANT
SUT VIC AB 3-0 SH 27 (SUTURE) ×2
SUT VIC AB 3-0 SH 27X BRD (SUTURE) ×1 IMPLANT
SYR 5ML LL (SYRINGE) ×3 IMPLANT
SYR CONTROL 10ML LL (SYRINGE) ×6 IMPLANT
SYRINGE 10CC LL (SYRINGE) IMPLANT
TOWEL OR 17X24 6PK STRL BLUE (TOWEL DISPOSABLE) ×6 IMPLANT
TOWEL OR NON WOVEN STRL DISP B (DISPOSABLE) ×3 IMPLANT
TUBE CONNECTING 20'X1/4 (TUBING)
TUBE CONNECTING 20X1/4 (TUBING) IMPLANT
YANKAUER SUCT BULB TIP NO VENT (SUCTIONS) IMPLANT

## 2014-09-07 NOTE — Anesthesia Preprocedure Evaluation (Signed)
Anesthesia Evaluation  Patient identified by MRN, date of birth, ID band Patient awake    Reviewed: Allergy & Precautions, NPO status , Unable to perform ROS - Chart review only  Airway Mallampati: II  TM Distance: >3 FB Neck ROM: Full    Dental  (+) Teeth Intact, Dental Advisory Given   Pulmonary former smoker,  breath sounds clear to auscultation        Cardiovascular Rhythm:Regular Rate:Normal     Neuro/Psych    GI/Hepatic   Endo/Other    Renal/GU      Musculoskeletal   Abdominal   Peds  Hematology   Anesthesia Other Findings   Reproductive/Obstetrics                             Anesthesia Physical Anesthesia Plan  ASA: II  Anesthesia Plan: General   Post-op Pain Management:    Induction: Intravenous  Airway Management Planned: LMA  Additional Equipment:   Intra-op Plan:   Post-operative Plan:   Informed Consent: I have reviewed the patients History and Physical, chart, labs and discussed the procedure including the risks, benefits and alternatives for the proposed anesthesia with the patient or authorized representative who has indicated his/her understanding and acceptance.   Dental advisory given  Plan Discussed with: CRNA and Anesthesiologist  Anesthesia Plan Comments:         Anesthesia Quick Evaluation

## 2014-09-07 NOTE — Anesthesia Procedure Notes (Signed)
Procedure Name: LMA Insertion Date/Time: 09/07/2014 12:30 PM Performed by: Maryella Shivers Pre-anesthesia Checklist: Patient identified, Emergency Drugs available, Suction available and Patient being monitored Patient Re-evaluated:Patient Re-evaluated prior to inductionOxygen Delivery Method: Circle System Utilized Preoxygenation: Pre-oxygenation with 100% oxygen Intubation Type: IV induction Ventilation: Mask ventilation without difficulty LMA: LMA inserted LMA Size: 4.0 Number of attempts: 1 Airway Equipment and Method: Bite block Placement Confirmation: positive ETCO2 Tube secured with: Tape Dental Injury: Teeth and Oropharynx as per pre-operative assessment

## 2014-09-07 NOTE — H&P (View-Only) (Signed)
Lori [name] 07/13/2014 10:15 AM Location: [facility] Patient #: [id number] DOB: 06-13-1965 Undefined / Language: Suszanne Conners / Race: Undefined Female  History of Present Illness Sammuel Hines. Marlou Starks MD; 07/13/2014 4:13 PM) The patient is a 49 year old female who presents with breast cancer. We're asked to see the patient in consultation by Dr. Luberta Robertson to evaluate her for a left-sided breast cancer. The patient is a 49 year old white female who first felt a mass in the upper outer portion of the left breast about 2 months ago. She brought this to her doctor's attention. She was evaluated with mammogram ultrasound and MRI. There was a large area of distortion in the upper outer quadrant of the left breast which measured 9.3 cm by MRI. This was biopsied and came back as an invasive lobular cancer. She was ER and PR positive and HER-2 negative with a Ki-67 of less than 5% she denies any breast pain or discharge from the nipple. She does not take any hormone replacement. Of note her husband is also an old patient of mine who had a rectal procedure that got infected.   Other Problems Anderson Malta [place], RMA; 07/13/2014 10:15 AM) Asthma Breast Cancer Hemorrhoids Lump In Breast  Past Surgical History Anderson Malta [place], RMA; 07/13/2014 10:15 AM) Breast Biopsy Left. Cesarean Section - Multiple Knee Surgery Left. Oral Surgery  Diagnostic Studies History Anderson Malta [place], [place]; 07/13/2014 10:15 AM) Mammogram within last year Pap Smear 1-5 years ago  Social History Anderson Malta [place], RMA; 07/13/2014 10:15 AM) Alcohol use Occasional alcohol use. Caffeine use Coffee. No drug use Tobacco use Former smoker.  Family History Anderson Malta [place], [place]; 07/13/2014 10:15 AM) Arthritis Mother. Breast Cancer Family Members In General, Mother. Cancer Family Members In General. Cerebrovascular Accident Family Members In General. Colon Polyps Mother. Heart Disease Father. Hypertension  Mother. Ovarian Cancer Family Members In General.  Pregnancy / Birth History Jeanann [place], [place]; 07/13/2014 10:15 AM) Age at menarche 25 years. Contraceptive History Oral contraceptives. Gravida 2 Maternal age 80-35 Para 2 Regular periods  Review of Systems Anderson Malta [place] RMA; 07/13/2014 10:15 AM) General Not Present- Appetite Loss, Chills, Fatigue, Fever, Night Sweats, Weight Gain and Weight Loss. Skin Not Present- Change in Wart/Mole, Dryness, Hives, Jaundice, New Lesions, Non-Healing Wounds, Rash and Ulcer. HEENT Present- Wears glasses/contact lenses. Not Present- Earache, Hearing Loss, Hoarseness, Nose Bleed, Oral Ulcers, Ringing in the Ears, Seasonal Allergies, Sinus Pain, Sore Throat, Visual Disturbances and Yellow Eyes. Breast Present- Breast Mass and Skin Changes. Not Present- Breast Pain and Nipple Discharge. Cardiovascular Not Present- Chest Pain, Difficulty Breathing Lying Down, Leg Cramps, Palpitations, Rapid Heart Rate, Shortness of Breath and Swelling of Extremities. Gastrointestinal Not Present- Abdominal Pain, Bloating, Bloody Stool, Change in Bowel Habits, Chronic diarrhea, Constipation, Difficulty Swallowing, Excessive gas, Gets full quickly at meals, Hemorrhoids, Indigestion, Nausea, Rectal Pain and Vomiting. Female Genitourinary Not Present- Frequency, Nocturia, Painful Urination, Pelvic Pain and Urgency. Musculoskeletal Present- Joint Pain. Not Present- Back Pain, Joint Stiffness, Muscle Pain, Muscle Weakness and Swelling of Extremities. Neurological Not Present- Decreased Memory, Fainting, Headaches, Numbness, Seizures, Tingling, Tremor, Trouble walking and Weakness. Psychiatric Present- Anxiety and Fearful. Not Present- Bipolar, Change in Sleep Pattern, Depression and Frequent crying. Endocrine Not Present- Cold Intolerance, Excessive Hunger, Hair Changes, Heat Intolerance, Hot flashes and New Diabetes.   Physical Exam Eddie Dibbles S. Marlou Starks MD; 07/13/2014 4:14  PM) General Mental Status-Alert. General Appearance-Consistent with stated age. Hydration-Well hydrated. Voice-Normal.  Head and Neck Head-normocephalic, atraumatic with no lesions or palpable masses. Trachea-midline. Thyroid Gland  Characteristics - normal size and consistency.  Eye Eyeball - Bilateral-Extraocular movements intact. Sclera/Conjunctiva - Bilateral-No scleral icterus.  Chest and Lung Exam Chest and lung exam reveals -quiet, even and easy respiratory effort with no use of accessory muscles and on auscultation, normal breath sounds, no adventitious sounds and normal vocal resonance. Inspection Chest Wall - Normal. Back - normal.  Breast Note: There is a palpable mass in the upper outer quadrant of the left breast that measures about 5-6 cm. It is mobile. There is no palpable mass in the right breast. There is no palpable axillary, supraclavicular, or cervical lymphadenopathy.   Cardiovascular Cardiovascular examination reveals -normal heart sounds, regular rate and rhythm with no murmurs and normal pedal pulses bilaterally.  Abdomen Inspection Inspection of the abdomen reveals - No Hernias. Skin - Scar - no surgical scars. Palpation/Percussion Palpation and Percussion of the abdomen reveal - Soft, Non Tender, No Rebound tenderness, No Rigidity (guarding) and No hepatosplenomegaly. Auscultation Auscultation of the abdomen reveals - Bowel sounds normal.  Neurologic Neurologic evaluation reveals -alert and oriented x 3 with no impairment of recent or remote memory. Mental Status-Normal.  Musculoskeletal Normal Exam - Left-Upper Extremity Strength Normal and Lower Extremity Strength Normal. Normal Exam - Right-Upper Extremity Strength Normal and Lower Extremity Strength Normal.  Lymphatic Head & Neck  General Head & Neck Lymphatics: Bilateral - Description - Normal. Axillary  General Axillary Region: Bilateral - Description -  Normal. Tenderness - Non Tender. Femoral & Inguinal  Generalized Femoral & Inguinal Lymphatics: Bilateral - Description - Normal. Tenderness - Non Tender.    Assessment & Plan Eddie Dibbles S. Marlou Starks MD; 07/13/2014 4:20 PM) PRIMARY CANCER OF UPPER OUTER QUADRANT OF LEFT FEMALE BREAST (174.4  C50.412) Impression: The patient has a large cancer in the upper outer quadrant of the left breast measuring about 9.3 cm. Given the size of the tumor I think her best option would be mastectomy. Clinically her nodes are negative so she would be a good candidate for sentinel node mapping. Given the size of the tumor there is also a chance that she would require postmastectomy radiation and it is the consensus opinion that she should wait on reconstruction. She is also going to undergo genetic testing given her strong family history. I have discussed with her in detail the risks and benefits of the operation as well as some of the technical aspects and she understands. She would like to think about it for a day or 2 before deciding which operation she would like to do and whether she would like me to do it. If she would like a second opinion we will arrange for that for her.   She has decided on left mastectomy with sentinel node biopsy and right prophylactic mastectomy  Signed by Luella Cook, MD (07/13/2014 4:21 PM)

## 2014-09-07 NOTE — Anesthesia Postprocedure Evaluation (Signed)
  Anesthesia Post-op Note  Patient: Lori [name]  Procedure(s) Performed: Procedure(s): INSERTION PORT-A-CATH (Right)  Patient Location: PACU  Anesthesia Type:General  Level of Consciousness: awake, alert  and oriented  Airway and Oxygen Therapy: Patient Spontanous Breathing  Post-op Pain: none  Post-op Assessment: Post-op Vital signs reviewed, Patient's Cardiovascular Status Stable, Respiratory Function Stable, Patent Airway and Pain level controlled              Post-op Vital Signs: stable  Last Vitals:  Filed Vitals:   09/07/14 1410  BP:   Pulse: 73  Temp:   Resp: 13    Complications: No apparent anesthesia complications

## 2014-09-07 NOTE — Interval H&P Note (Signed)
History and Physical Interval Note:  09/07/2014 12:01 PM  Lori [name]  has presented today for surgery, with the diagnosis of LEFT BREAST CANCER  The various methods of treatment have been discussed with the patient and family. After consideration of risks, benefits and other options for treatment, the patient has consented to  Procedure(s): INSERTION PORT-A-CATH (Right) as a surgical intervention .  The patient's history has been reviewed, patient examined, no change in status, stable for surgery.  I have reviewed the patient's chart and labs.  Questions were answered to the patient's satisfaction.     TOTH III,Leontae Bostock S

## 2014-09-07 NOTE — Op Note (Signed)
09/07/2014  1:20 PM  PATIENT:  Lori [name]  49 y.o. female  PRE-OPERATIVE DIAGNOSIS:  LEFT BREAST CANCER  POST-OPERATIVE DIAGNOSIS:  LEFT BREAST CANCER  PROCEDURE:  Procedure(s): INSERTION PORT-A-CATH (Right) subclavian vein  SURGEON:  Surgeon(s) and Role:    * Jovita Kussmaul, MD - Primary  PHYSICIAN ASSISTANT:   ASSISTANTS: none   ANESTHESIA:   general  EBL:  Total I/O In: 1000 [I.V.:1000] Out: -   BLOOD ADMINISTERED:none  DRAINS: none   LOCAL MEDICATIONS USED:  MARCAINE     SPECIMEN:  No Specimen  DISPOSITION OF SPECIMEN:  N/A  COUNTS:  YES  TOURNIQUET:  * No tourniquets in log *  DICTATION: .Dragon Dictation  After informed consent was obtained the patient was brought to the operating room and placed in the supine position on the operating room table. After adequate induction of general anesthesia a roll was placed between the patient's shoulder blades to extend the shoulder slightly. The right chest and neck area were then prepped with ChloraPrep, allowed to dry, and draped in usual sterile manner. The patient was placed in Trendelenburg position. The area lateral to the bend of the clavicle on the right chest was infiltrated with quarter percent Marcaine. A small incision was made with a 15 blade knife lateral to the bend of the clavicle. A large bore needle from the Port-A-Cath kit was used to slide beneath the bend of the clavicle heading towards the sternal notch and in doing so we were able to access the right subclavian vein without difficulty. A wire was fed through the needle using the Seldinger technique without difficulty. The needle was removed. The wire was confirmed in the central venous system using real-time fluoroscopy. Next the incision on the right chest was extended slightly. A subcutaneous pocket was created inferior to the incision using a combination of blunt finger dissection and some sharp dissection with electrocautery. The tubing was then placed  on the reservoir and the reservoir was placed in the pocket. The tubing was then estimated and cut to the appropriate length. Next a sheath and dilator were placed over the wire also using the Seldinger technique without difficulty. The dilator and wire were removed. The tubing was fed through the sheath as far as it would go and then held in place while the sheath was gently cracked and separated. Another real-time fluoroscopy image showed the tip of the catheter to be in the distal superior vena cava. The tubing was then permanently anchored to the reservoir. The reservoir was anchored in the pocket using 2 2-0 Prolene stitches. The port was then aspirated and it aspirated blood easily. The port was then flushed initially with a dilute heparin solution and then with a more concentrated heparin solution. The subcutaneous tissue was then closed over the port with interrupted 3-0 Vicryl stitches. The skin was closed with a running 4-0 Monocryl subcuticular stitch. Dermabond dressings were applied. The patient tolerated the procedure well. At the end of the case all needle sponge and instrument counts were correct. The patient was then awakened and taken to recovery in stable condition.  PLAN OF CARE: Discharge to home after PACU  PATIENT DISPOSITION:  PACU - hemodynamically stable.   Delay start of Pharmacological VTE agent (>24hrs) due to surgical blood loss or risk of bleeding: not applicable

## 2014-09-07 NOTE — Transfer of Care (Signed)
Immediate Anesthesia Transfer of Care Note  Patient: Lori [name]  Procedure(s) Performed: Procedure(s): INSERTION PORT-A-CATH (Right)  Patient Location: PACU  Anesthesia Type:General  Level of Consciousness: awake, alert  and oriented  Airway & Oxygen Therapy: Patient Spontanous Breathing and Patient connected to face mask oxygen  Post-op Assessment: Report given to RN and Post -op Vital signs reviewed and stable  Post vital signs: Reviewed and stable  Last Vitals:  Filed Vitals:   09/07/14 1126  BP: 140/67  Pulse: 71  Temp: 36.8 C  Resp: 19    Complications: No apparent anesthesia complications

## 2014-09-07 NOTE — Discharge Instructions (Signed)

## 2014-09-08 ENCOUNTER — Other Ambulatory Visit: Payer: BLUE CROSS/BLUE SHIELD

## 2014-09-08 ENCOUNTER — Encounter (HOSPITAL_BASED_OUTPATIENT_CLINIC_OR_DEPARTMENT_OTHER): Payer: Self-pay | Admitting: General Surgery

## 2014-09-08 ENCOUNTER — Encounter: Payer: BLUE CROSS/BLUE SHIELD | Admitting: Nurse Practitioner

## 2014-09-09 NOTE — Progress Notes (Signed)
This encounter was created in error - please disregard.

## 2014-09-12 ENCOUNTER — Ambulatory Visit: Payer: BLUE CROSS/BLUE SHIELD | Attending: General Surgery | Admitting: Physical Therapy

## 2014-09-12 DIAGNOSIS — C50412 Malignant neoplasm of upper-outer quadrant of left female breast: Secondary | ICD-10-CM | POA: Insufficient documentation

## 2014-09-12 DIAGNOSIS — M25612 Stiffness of left shoulder, not elsewhere classified: Secondary | ICD-10-CM | POA: Insufficient documentation

## 2014-09-12 DIAGNOSIS — Z9189 Other specified personal risk factors, not elsewhere classified: Secondary | ICD-10-CM | POA: Insufficient documentation

## 2014-09-12 DIAGNOSIS — M25611 Stiffness of right shoulder, not elsewhere classified: Secondary | ICD-10-CM | POA: Diagnosis present

## 2014-09-12 NOTE — Patient Instructions (Signed)
[email or web address] Lymphatic flow series with shoosh lettick crotzer

## 2014-09-12 NOTE — Therapy (Signed)
[place], [place], [address] Phone: [phone]   Fax:  [phone]  Physical Therapy Treatment  Patient Details  Name: Lori [name] MRN: [id number] Date of Birth: [birth year] Referring Provider:  Jovita Kussmaul, MD  Encounter Date: 09/12/2014      PT End of Session - 09/12/14 1745    Visit Number 2   Number of Visits 9   Date for PT Re-Evaluation 10/03/14   PT Start Time 1300   PT Stop Time 1345   PT Time Calculation (min) 45 min      Past Medical History  Diagnosis Date  . Asthma     last trimester of pregnancy 3 yrs ago-  never had problems since then, no meds  . Breast cancer   . Anxiety     Past Surgical History  Procedure Laterality Date  . Cesarean section      x 2  . Knee arthroscopy Left 07/27/2013    Procedure: LEFT ARTHROSCOPY KNEE WITH MEDIAL MENISCAL DEBRIDEMENT;  Surgeon: Gearlean Alf, MD;  Location: [facility];  Service: Orthopedics;  Laterality: Left;  Marland Kitchen Mastectomy w/ sentinel node biopsy Left 08/10/2014  . Mastectomy w/ sentinel node biopsy Bilateral 08/10/2014    Procedure: MASTECTOMY WITH SENTINEL LYMPH NODE BIOPSY;  Surgeon: [name], MD;  Location: [facility];  Service: General;  Laterality: Bilateral;  . Portacath placement Right 09/07/2014    Procedure: INSERTION PORT-A-CATH;  Surgeon: [name], MD;  Location: [facility];  Service: General;  Laterality: Right;    There were no vitals filed for this visit.  Visit Diagnosis:  Stiffness of joint, shoulder region, left  At risk for lymphedema  Stiffness of joint, shoulder region, right      Subjective Assessment - 09/12/14 1306    Subjective I had port put in on Wednesday of last week.  (july 6) and they told me to take it easy for a while.    Pertinent History She was diagnosed 07/05/14 with left invasive lobular cancer that is ER/PR positive and HER2 negative.  It has a Ki67 of 1% and measures  2.2 cm on ultrasound but 9.3 cm total enhancement on MRI. Had bilateral mastectomy on June 8  with ALND ( "all of them") on left side, none removed on right.  Plans to have chemotherapy to start at the end of July with radiation to follow.  Considering eventual reconstruction    Patient Stated Goals get more range of motion in shoulders and back, wants to get loosened   Currently in Pain? Yes   Pain Score 2    Pain Location --  port site   Pain Orientation Right;Lateral;Left   Pain Descriptors / Indicators Sore   Pain Type Surgical pain   Pain Onset 1 to 4 weeks ago   Pain Frequency Intermittent   Aggravating Factors  moving                          [facility] Adult PT Treatment/Exercise - 09/12/14 0001    Manual Therapy   Manual Therapy Manual Lymphatic Drainage (MLD);Passive ROM   Manual Lymphatic Drainage (MLD) short neck, (avoiding port on right side) superficial and deep abdominals, right inguinls, right axillo inguinao- anastamosis, right axillary nodes, right chest and upper arm, left inguinal nodes, left axillo-inguino anastamosis, left upper arm and chest    Passive ROM passive range of motion with gentle stretch  to both shoulders,    Neck Exercises: Stretches   Other Neck Stretches lymphatic flow series, with precaution not to stretch or compress port on right side                 PT Education - 09/12/14 1744    Education provided Yes   Education Details lymphatic flow series, issued Klosetraining DVD for self manual lymph draiange   Person(s) Educated Patient   Methods Explanation;Demonstration;Handout   Comprehension Verbalized understanding              Breast Clinic Goals - 07/13/14 1648    Patient will be able to verbalize understanding of pertinent lymphedema risk reduction practices relevant to her diagnosis specifically related to skin care.   Time 1   Period Days   Status Achieved   Patient will be able to return demonstrate and/or  verbalize understanding of the post-op home exercise program related to regaining shoulder range of motion.   Time 1   Period Days   Status Achieved   Patient will be able to verbalize understanding of the importance of attending the postoperative After Breast Cancer Class for further lymphedema risk reduction education and therapeutic exercise.   Time 1   Period Days   Status Achieved          Long Term Clinic Goals - 09/02/14 7026    CC Long Term Goal  #1   Title Patient with verbalize an understanding of lymphedema risk reduction precautions   Time 4   Period Weeks   Status New   CC Long Term Goal  #2   Title Patient will be independent in a basic home exercise program   Time 4   Period Weeks   Status New   CC Long Term Goal  #3   Title Patient will be knowledgable  in strength ABC  home exercise program and how to progress weight when she is ready    Time 4   Period Weeks   Status New   CC Long Term Goal  #4   Title Patient will improve shoulder flexion range of motion to 150 degrees to perform activities of daily living and household chores with greater ease   Time 4   Period Weeks   Status New   CC Long Term Goal  #5   Title Patient will improve left shoulder abduction to 120 degrees so that she can achieve position needed for radiation therapy.   Time 4   Period Weeks   Status New   CC Long Term Goal  #6   Title Patient will decrease the DASH score to <  25  to demonstrate increased functional use of upper extremity   Baseline 43.18   Time 4   Period Weeks   Status New   Additional Goals   Additional Goals Yes            Plan - 09/12/14 1745    Clinical Impression Statement Pt has not been moving shoulders much since port insertion last week.  She has pain with stretching with empty end feel but alllowed more range after manual lymph drainage and passive exercise.She is motivated to continue to improve helped pt get registered for ABC class   PT Next  Visit Plan manual lymph drainage A/AA/PROM to both shoulders.be cautious of port  Make sure she has signed up for ABC class, advance home program        Problem List Patient Active Problem List  Diagnosis Date Noted  . Breast cancer 07/14/2014  . Family history of malignant neoplasm of breast 07/14/2014  . Family history of malignant neoplasm of ovary 07/14/2014  . Family history of uterine cancer 07/14/2014  . Breast cancer of upper-outer quadrant of left female breast 07/07/2014  . Acute medial meniscal tear 07/27/2013   Donato Heinz. Owens Shark, PT   09/12/2014, 5:49 PM  [place], [place], [address] Phone: [phone]   Fax:  [phone]

## 2014-09-13 ENCOUNTER — Ambulatory Visit: Payer: BLUE CROSS/BLUE SHIELD

## 2014-09-13 ENCOUNTER — Other Ambulatory Visit: Payer: BLUE CROSS/BLUE SHIELD

## 2014-09-15 ENCOUNTER — Other Ambulatory Visit: Payer: Self-pay | Admitting: Nurse Practitioner

## 2014-09-15 ENCOUNTER — Ambulatory Visit: Payer: BLUE CROSS/BLUE SHIELD | Admitting: Physical Therapy

## 2014-09-15 DIAGNOSIS — M25612 Stiffness of left shoulder, not elsewhere classified: Secondary | ICD-10-CM

## 2014-09-15 DIAGNOSIS — M25611 Stiffness of right shoulder, not elsewhere classified: Secondary | ICD-10-CM

## 2014-09-15 DIAGNOSIS — Z9189 Other specified personal risk factors, not elsewhere classified: Secondary | ICD-10-CM

## 2014-09-15 NOTE — Therapy (Signed)
[place], [place], [address] Phone: [phone]   Fax:  [phone]  Physical Therapy Treatment  Patient Details  Name: Lori [name] MRN: [id number] Date of Birth: 1965/11/08 Referring Provider:  Jovita Kussmaul, MD  Encounter Date: 09/15/2014      PT End of Session - 09/15/14 1254    Visit Number 3   Number of Visits 9   Date for PT Re-Evaluation 10/03/14   PT Start Time 0800   PT Stop Time 0845   PT Time Calculation (min) 45 min      Past Medical History  Diagnosis Date  . Asthma     last trimester of pregnancy 3 yrs ago-  never had problems since then, no meds  . Breast cancer   . Anxiety     Past Surgical History  Procedure Laterality Date  . Cesarean section      x 2  . Knee arthroscopy Left 07/27/2013    Procedure: LEFT ARTHROSCOPY KNEE WITH MEDIAL MENISCAL DEBRIDEMENT;  Surgeon: Gearlean Alf, MD;  Location: [facility];  Service: Orthopedics;  Laterality: Left;  Marland Kitchen Mastectomy w/ sentinel node biopsy Left 08/10/2014  . Mastectomy w/ sentinel node biopsy Bilateral 08/10/2014    Procedure: MASTECTOMY WITH SENTINEL LYMPH NODE BIOPSY;  Surgeon: [name], MD;  Location: [facility];  Service: General;  Laterality: Bilateral;  . Portacath placement Right 09/07/2014    Procedure: INSERTION PORT-A-CATH;  Surgeon: [name], MD;  Location: [facility];  Service: General;  Laterality: Right;    There were no vitals filed for this visit.  Visit Diagnosis:  Stiffness of joint, shoulder region, left  At risk for lymphedema  Stiffness of joint, shoulder region, right      Subjective Assessment - 09/15/14 0804    Subjective my left one is doing good. Still have some swelling in abdomen, but she has been doing the manual lymph drainage. she has noticed that she has been able to more her arms more.    Pertinent History She was diagnosed 07/05/14 with left invasive lobular cancer  that is ER/PR positive and HER2 negative.  It has a Ki67 of 1% and measures 2.2 cm on ultrasound but 9.3 cm total enhancement on MRI. Had bilateral mastectomy on June 8  with ALND ( "all of them") on left side, none removed on right.  Plans to have chemotherapy to start at the end of July with radiation to follow.  Considering eventual reconstruction    Patient Stated Goals get more range of motion in shoulders and back, wants to get loosened   Currently in Pain? Yes   Pain Score 2    Pain Location Shoulder   Pain Orientation Right   Pain Descriptors / Indicators Tender   Pain Type Surgical pain            [facility] PT Assessment - 09/15/14 0001    AROM   Right Shoulder Flexion 136 Degrees   Right Shoulder ABduction 154 Degrees   Left Shoulder Flexion 144 Degrees   Left Shoulder ABduction 141 Degrees                     [facility] Adult PT Treatment/Exercise - 09/15/14 0001    Shoulder Exercises: Standing   Flexion AAROM  finger ladder to 27with left arm did not do it with right   Other Standing Exercises wall washing  with modifications for right arm due  to pain    Manual Therapy   Manual Lymphatic Drainage (MLD) short neck, (avoiding port on right side) superficial and deep abdominals, right inguinls, right axillo inguinao- anastamosis, right axillary nodes, right chest and upper arm, left inguinal nodes, left axillo-inguino anastamosis, left upper arm and chest    Passive ROM passive range of motion with gentle stretch to both shoulders,                       Breast Clinic Goals - 07/13/14 1648    Patient will be able to verbalize understanding of pertinent lymphedema risk reduction practices relevant to her diagnosis specifically related to skin care.   Time 1   Period Days   Status Achieved   Patient will be able to return demonstrate and/or verbalize understanding of the post-op home exercise program related to regaining shoulder range of motion.   Time  1   Period Days   Status Achieved   Patient will be able to verbalize understanding of the importance of attending the postoperative After Breast Cancer Class for further lymphedema risk reduction education and therapeutic exercise.   Time 1   Period Days   Status Achieved          Long Term Clinic Goals - 09/15/14 1255    CC Long Term Goal  #1   Title Patient with verbalize an understanding of lymphedema risk reduction precautions   Status On-going   CC Long Term Goal  #2   Title Patient will be independent in a basic home exercise program   Status On-going   CC Long Term Goal  #3   Title Patient will be knowledgable  in strength ABC  home exercise program and how to progress weight when she is ready    Status On-going   CC Long Term Goal  #4   Title Patient will improve right and left shoulder flexion range of motion to 150 degrees to perform activities of daily living and household chores with greater ease   Status On-going   CC Long Term Goal  #5   Title Patient will improve left shoulder abduction to 120 degrees so that she can achieve position needed for radiation therapy.   Status On-going   CC Long Term Goal  #6   Title Patient will decrease the DASH score to <  25  to demonstrate increased functional use of upper extremity   Status On-going            Plan - 09/15/14 1254    Clinical Impression Statement pt continues to have some pain and swelling in right anterior chest around port insertion.  She has made good progress with shoulder flexion range of motion but has not quite achieved goals. upgraded exercise program toay   Clinical Impairments Affecting Rehab Potential recent surgery with ALND on left    PT Next Visit Plan manual lymph drainage A/AA/PROM to both shoulders.be cautious of port   advance home program   Consulted and Agree with Plan of Care Patient        Problem List Patient Active Problem List   Diagnosis Date Noted  . Breast cancer  07/14/2014  . Family history of malignant neoplasm of breast 07/14/2014  . Family history of malignant neoplasm of ovary 07/14/2014  . Family history of uterine cancer 07/14/2014  . Breast cancer of upper-outer quadrant of left female breast 07/07/2014  . Acute medial meniscal tear 07/27/2013   Donato Heinz. Owens Shark,  PT  09/15/2014, 1:12 PM  [place], [place], [address] Phone: [phone]   Fax:  [phone]

## 2014-09-16 ENCOUNTER — Telehealth: Payer: Self-pay | Admitting: Oncology

## 2014-09-16 ENCOUNTER — Other Ambulatory Visit: Payer: Self-pay | Admitting: *Deleted

## 2014-09-16 ENCOUNTER — Encounter: Payer: Self-pay | Admitting: Nurse Practitioner

## 2014-09-16 ENCOUNTER — Ambulatory Visit (HOSPITAL_BASED_OUTPATIENT_CLINIC_OR_DEPARTMENT_OTHER): Payer: BLUE CROSS/BLUE SHIELD | Admitting: Nurse Practitioner

## 2014-09-16 ENCOUNTER — Other Ambulatory Visit: Payer: BLUE CROSS/BLUE SHIELD

## 2014-09-16 VITALS — BP 129/72 | HR 84 | Temp 97.7°F | Resp 18 | Wt 212.6 lb

## 2014-09-16 DIAGNOSIS — D0501 Lobular carcinoma in situ of right breast: Secondary | ICD-10-CM

## 2014-09-16 DIAGNOSIS — C50412 Malignant neoplasm of upper-outer quadrant of left female breast: Secondary | ICD-10-CM

## 2014-09-16 DIAGNOSIS — Z170 Estrogen receptor positive status [ER+]: Secondary | ICD-10-CM | POA: Diagnosis not present

## 2014-09-16 MED ORDER — PROCHLORPERAZINE MALEATE 10 MG PO TABS: 10.0000 mg | ORAL_TABLET | Freq: Four times a day (QID) | ORAL | Status: DC | PRN

## 2014-09-16 MED ORDER — LIDOCAINE-PRILOCAINE 2.5-2.5 % EX CREA: TOPICAL_CREAM | CUTANEOUS | Status: DC

## 2014-09-16 MED ORDER — LORAZEPAM 0.5 MG PO TABS: 0.5000 mg | ORAL_TABLET | Freq: Every day | ORAL | Status: DC

## 2014-09-16 MED ORDER — ONDANSETRON HCL 8 MG PO TABS: 8.0000 mg | ORAL_TABLET | Freq: Two times a day (BID) | ORAL | Status: DC | PRN

## 2014-09-16 MED ORDER — DEXAMETHASONE 4 MG PO TABS: ORAL_TABLET | ORAL | Status: DC

## 2014-09-16 NOTE — Telephone Encounter (Signed)
Confirmed appointment for Echo.

## 2014-09-16 NOTE — Progress Notes (Signed)
[facility]  [phone] [phone]   ID: Lori [name] DOB: [birth year]  MR#: [id number]  TWS#:[id number]  Patient Care Team: Lori Shan, MD as PCP - General (Family Medicine) Lori Messing III, MD as Consulting Physician (General Surgery) Lori Cruel, MD as Consulting Physician (Oncology) Lori Gibson, MD as Attending Physician (Radiation Oncology) Lori Germany, RN as Registered Nurse Lori Kaufmann, RN as Registered Nurse Lori Bouche, NP as Nurse Practitioner (Nurse Practitioner) PCP: Lori Simmer, MD GYN: Lori Ache MD OTHER MD:  CHIEF COMPLAINT: Estrogen receptor positive breast cancer  CURRENT TREATMENT: adjuvant chemotherapy   BREAST CANCER HISTORY: From the original intake note:  Lori [name] (who is the daughter of my former patient, Lori [name], herself now 10 years out from her T1 cN0 invasive ductal carcinoma, treated with anti-estrogens only) went [facility] April 20 03/23/2014. Breast density was category C. A possible mass in the left breast upper outer quadrant was felt to warrant further evaluation, and on 07/04/2014 the patient underwent left mammography with tomosynthesis and left breast ultrasonography. At this point the patient felt she was able to palpate a mass in the area in question. Tomosynthesis did reveal an irregular mass in the upper outer left breast measuring 2.3 cm, associated on physical exam with a large area of thickening. Ultrasound of the area in question confirmed an irregular mass at the 1:00 position 5 cm from the nipple measuring 2.2 cm. The left axilla was negative sonographically.  Biopsy of the mass in question the same day, 07/04/2014, showed (SAA [phone]) an invasive lobular breast cancer which was estrogen receptor 50% positive with strong staining intensity, progesterone receptor 1% positive with moderate staining intensity, with a proliferation  marker of 1%, and no HER-2 amplification, the signals ratio being 1.19 and the number per cell 1.55.  On 07/08/2014 the patient underwent bilateral breast MRI. This showed no abnormal adenopathy and no abnormality in the right breast. In the left breast however there was a 9.3 area of abnormal enhancement involving all quadrants. Within the upper outer quadrant there was a denser area which measured up to 5.1 cm.  The patient's subsequent history is as detailed below  INTERVAL HISTORY: Lori [name] returns today for follow-up of her breast cancer, alone. She had a port placed last week and attended chemotherapy this morning. She is here to learn about her antiemetic schedule in anticipation of doxorubicin and cyclophosphamide on 7/26.   REVIEW OF SYSTEMS: Lori [name] is doing well today. She continues to heal well from surgery and is working with physical therapy to regain her range of motion. She is not using any narcotics for pain. She denies fevers, chills, bleeding, or bruising. A detailed review of systems today was otherwise stable    PAST MEDICAL HISTORY: Past Medical History  Diagnosis Date  . Asthma     last trimester of pregnancy 3 yrs ago-  never had problems since then, no meds  . Breast cancer   . Anxiety     PAST SURGICAL HISTORY: Past Surgical History  Procedure Laterality Date  . Cesarean section      x 2  . Knee arthroscopy Left 07/27/2013    Procedure: LEFT ARTHROSCOPY KNEE WITH MEDIAL MENISCAL DEBRIDEMENT;  Surgeon: Lori Alf, MD;  Location: [facility];  Service: Orthopedics;  Laterality: Left;  Marland Kitchen Mastectomy w/ sentinel node biopsy Left 08/10/2014  . Mastectomy w/ sentinel node biopsy Bilateral 08/10/2014  Procedure: MASTECTOMY WITH SENTINEL LYMPH NODE BIOPSY;  Surgeon: Lori Messing III, MD;  Location: [facility];  Service: General;  Laterality: Bilateral;  . Portacath placement Right 09/07/2014    Procedure: INSERTION PORT-A-CATH;  Surgeon: Lori Messing III, MD;  Location: [facility];  Service: General;  Laterality: Right;    FAMILY HISTORY Family History  Problem Relation Age of Onset  . Breast cancer Mother 65  . Lymphoma Maternal Aunt 71  . Uterine cancer Paternal Aunt 35  . Uterine cancer Paternal Grandmother 27  . Ovarian cancer Other 72    mat great aunt through [facility]  . Ovarian cancer Maternal Grandmother 47  The patient's father died with congestive heart failure at the age of 23. The patient's mother is living, age 56. She was diagnosed with breast cancer at age 49. The patient's paternal grandmother was diagnosed with uterine cancer at age 32 and her daughter, the patient's paternal aunt, also with uterine cancer at age 42. On the mother's side one maternal aunt was diagnosed with lymphoma and the other with throat cancer. A maternal great aunt at age 61 was diagnosed with ovarian cancer.   GYNECOLOGIC HISTORY:  Patient's last menstrual period was 08/26/2014.  menarche age 70, first live birth age 11, which the patient is aware double as the risk of breast cancer. She is GX P2. She still having regular periods. 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 Her husband Lori [name] the third (goes by "Lori [name]") works in Engineer, technical sales, although currently he is looking for work. Their children are Lori [name] 12 and Lori [name] 3. The patient is not a church attender    ADVANCED DIRECTIVES: Not in place   HEALTH MAINTENANCE: History  Substance Use Topics  . Smoking status: Former Smoker -- 1.00 packs/day for 8 years    Types: Cigarettes    Quit date: 07/20/2000  . Smokeless tobacco: Never Used  . Alcohol Use: Yes     Comment: socially     Colonoscopy:  PAP:  Bone density:  Lipid panel:  No Known Allergies  Current Outpatient Prescriptions  Medication Sig Dispense Refill  . ibuprofen (ADVIL,MOTRIN) 200 MG tablet Take 200 mg by mouth every 6 (six) hours as needed.    . tamoxifen  (NOLVADEX) 20 MG tablet Take 20 mg by mouth daily.    Marland Kitchen dexamethasone (DECADRON) 4 MG tablet Take 2 tablets by mouth once a day on the day after chemotherapy and then take 2 tablets two times a day for 2 days. Take with food. 30 tablet 1  . HYDROcodone-acetaminophen (NORCO/VICODIN) 5-325 MG per tablet Take 1-2 tablets by mouth every 4 (four) hours as needed for moderate pain or severe pain. (Patient not taking: Reported on 09/16/2014) 50 tablet 0  . lidocaine-prilocaine (EMLA) cream Apply to affected area once 30 g 3  . LORazepam (ATIVAN) 0.5 MG tablet Take 1 tablet (0.5 mg total) by mouth at bedtime. 30 tablet 0  . ondansetron (ZOFRAN) 8 MG tablet Take 1 tablet (8 mg total) by mouth 2 (two) times daily as needed. Start on the third day after chemotherapy. 30 tablet 1  . prochlorperazine (COMPAZINE) 10 MG tablet Take 1 tablet (10 mg total) by mouth every 6 (six) hours as needed (Nausea or vomiting). 30 tablet 1  . traMADol (ULTRAM) 50 MG tablet Take 1-2 tablets (50-100 mg total) by mouth every 6 (six) hours as needed. (Patient not  taking: Reported on 09/16/2014) 30 tablet 0  . zolpidem (AMBIEN) 5 MG tablet Take 1 tablet (5 mg total) by mouth at bedtime as needed for sleep. (Patient not taking: Reported on 09/16/2014) 10 tablet 0   No current facility-administered medications for this visit.    OBJECTIVE: young White woman who appears stated age 24 Vitals:   09/16/14 1127  BP: 129/72  Pulse: 84  Temp: 97.7 F (36.5 C)  Resp: 18     Body mass index is 32.33 kg/(m^2).    ECOG FS:1 - Symptomatic but completely ambulatory   Skin: warm, dry  HEENT: sclerae anicteric, conjunctivae pink, oropharynx clear. No thrush or mucositis.  Lymph Nodes: No cervical or supraclavicular lymphadenopathy  Lungs: clear to auscultation bilaterally, no rales, wheezes, or rhonci  Heart: regular rate and rhythm  Abdomen: round, soft, non tender, positive bowel sounds  Musculoskeletal: No focal spinal tenderness, no  peripheral edema  Neuro: non focal, well oriented, positive affect  Breasts: bilateral breasts status post mastectomies. Incision continue to heal well. New right upper chest portacath clean and dry.  LAB RESULTS:  CMP     Component Value Date/Time   NA 139 08/03/2014 1022   NA 140 07/13/2014 1226   K 4.1 08/03/2014 1022   K 4.2 07/13/2014 1226   CL 106 08/03/2014 1022   CO2 25 08/03/2014 1022   CO2 25 07/13/2014 1226   GLUCOSE 89 08/03/2014 1022   GLUCOSE 78 07/13/2014 1226   BUN 10 08/03/2014 1022   BUN 13.3 07/13/2014 1226   CREATININE 0.68 08/03/2014 1022   CREATININE 0.7 07/13/2014 1226   CALCIUM 9.3 08/03/2014 1022   CALCIUM 9.4 07/13/2014 1226   PROT 6.8 07/13/2014 1226   ALBUMIN 3.9 07/13/2014 1226   AST 21 07/13/2014 1226   ALT 30 07/13/2014 1226   ALKPHOS 72 07/13/2014 1226   BILITOT 0.26 07/13/2014 1226   GFRNONAA >60 08/03/2014 1022   GFRAA >60 08/03/2014 1022    INo results found for: SPEP, UPEP  Lab Results  Component Value Date   WBC 7.0 08/03/2014   NEUTROABS 5.6 07/13/2014   HGB 14.1 08/03/2014   HCT 42.4 08/03/2014   MCV 87.4 08/03/2014   PLT 250 08/03/2014      Chemistry      Component Value Date/Time   NA 139 08/03/2014 1022   NA 140 07/13/2014 1226   K 4.1 08/03/2014 1022   K 4.2 07/13/2014 1226   CL 106 08/03/2014 1022   CO2 25 08/03/2014 1022   CO2 25 07/13/2014 1226   BUN 10 08/03/2014 1022   BUN 13.3 07/13/2014 1226   CREATININE 0.68 08/03/2014 1022   CREATININE 0.7 07/13/2014 1226      Component Value Date/Time   CALCIUM 9.3 08/03/2014 1022   CALCIUM 9.4 07/13/2014 1226   ALKPHOS 72 07/13/2014 1226   AST 21 07/13/2014 1226   ALT 30 07/13/2014 1226   BILITOT 0.26 07/13/2014 1226       No results found for: LABCA2  No components found for: LABCA125  No results for input(s): INR in the last 168 hours.  Urinalysis No results found for: COLORURINE, APPEARANCEUR, LABSPEC, PHURINE, GLUCOSEU, HGBUR, BILIRUBINUR,  KETONESUR, PROTEINUR, UROBILINOGEN, NITRITE, LEUKOCYTESUR  STUDIES: Nm Bone Scan Whole Body  08/31/2014   CLINICAL DATA:  Staging breast cancer.  EXAM: NUCLEAR MEDICINE WHOLE BODY BONE SCAN  TECHNIQUE: Whole body anterior and posterior images were obtained approximately 3 hours after intravenous injection of radiopharmaceutical.  RADIOPHARMACEUTICALS:  25 mCi Technetium-77mMDP IV  COMPARISON:  None.  FINDINGS: There is increased activity noted in the midline between the orbits, possibly related to sinus disease. Increased activity noted in the left knee compatible with degenerative changes. No suspicious areas of bony uptake to suggest bony metastatic disease.  IMPRESSION: No  evidence for bony metastatic disease.  Midline activity in the face suspected to be related to sinus disease.   Electronically Signed   By: KRolm BaptiseM.D.   On: 08/31/2014 15:16   Ct Abdomen Pelvis W Contrast  08/31/2014   CLINICAL DATA:  Recent bilateral mastectomy for breast carcinoma. Upper outer quadrant left breast cancer.  EXAM: CT ABDOMEN AND PELVIS WITH CONTRAST  TECHNIQUE: Multidetector CT imaging of the abdomen and pelvis was performed using the standard protocol following bolus administration of intravenous contrast.  CONTRAST:  1064mOMNIPAQUE IOHEXOL 300 MG/ML  SOLN  COMPARISON:  None.  FINDINGS: Lower chest: Lung bases are clear.  Hepatobiliary: No focal hepatic lesion. No biliary duct dilatation. Gallbladder is normal. Common bile duct is normal.  Pancreas: Small 5 mm low-density lesion in the tail the pancreas on image 21, series 20. No ductal dilatation. No pancreatic inflammation.  Spleen: Normal spleen  Adrenals/urinary tract: Adrenal glands and kidneys are normal. The ureters and bladder normal.  Stomach/Bowel: Stomach, small bowel, appendix, and cecum are normal. The colon and rectosigmoid colon are normal.  Vascular/Lymphatic: Abdominal aorta is normal caliber. There is no retroperitoneal or periportal  lymphadenopathy. No pelvic lymphadenopathy.  Reproductive: Uterus and ovaries are normal.  Musculoskeletal: No aggressive osseous lesion.  Other: No free fluid.  IMPRESSION: 1. No evidence of metastatic disease in the abdomen pelvis. 2. Small low-density lesion the tail of the pancreas is likely a benign cyst. Consider follow-up MRI 12 months. This recommendation follows ACR consensus guidelines: Managing Incidental Findings on Abdominal CT: White Paper of the ACR Incidental Findings Committee. J Joellyn Ruedadiol [phone] Electronically Signed   By: StSuzy Bouchard.D.   On: 08/31/2014 12:32   Dg Chest Portable 1 View  09/07/2014   CLINICAL DATA:  [age]ear old female status post porta cath placement. Recent diagnosis of breast cancer. Subsequent encounter.  EXAM: PORTABLE CHEST - 1 VIEW  COMPARISON:  CT Abdomen and Pelvis 08/31/2014  FINDINGS: Portable AP upright view at 1403 hrs. Right chest subclavian approach porta cath. Catheter tip projects 1 vertebral body below the level of the carina, at the lower SVC level. No pneumothorax. Infrahilar streaky opacity slightly greater on the right most resembles atelectasis. Postoperative changes to the left axilla and chest wall. Normal cardiac size and mediastinal contours. No pulmonary edema or pleural effusion.  IMPRESSION: 1. Right chest subclavian approach porta cath placed with no adverse features. 2. Atelectasis.   Electronically Signed   By: H Genevie Ann.D.   On: 09/07/2014 13:42   Dg Fluoro Guide Cv Line-no Report  09/07/2014   CLINICAL DATA:    FLOURO GUIDE CV LINE  Fluoroscopy was utilized by the requesting physician.  No radiographic  interpretation.     ASSESSMENT: [age][place]. [place] status post left breast biopsy 07/04/2014 for a clinical T3 N0, stage IIA invasive lobular breast cancer, grade 2, estrogen receptor positive, progesterone receptor 1% "positive", with an MIB-1 of less than 5% and no HER-2 amplification  (1) genetics  testing 07/29/2014 through the [employer] gene panel offered by [employer] [employer] found no deleterious mutations in ATM, BARD1, BRCA1, BRCA2, BRIP1, CDH1, CHEK2, EPCAM,  MLH1, MRE11A, MSH2, MSH6, MUTYH, NBN, NF1, PALB2, PMS2, PTEN, RAD50, RAD51C, RAD51D, SMARCA4, STK11, or TP53.   (2) status post bilateral mastectomies 08/10/2014 showing  (a) on the right, lobular carcinoma in situ  (b) on the left, a pT3 pN1, stage IIIA invasive lobular breast cancer, HER-2 repeated  (3) chemotherapy will consist of standard doxorubicin and cyclophosphamide in dose dense fashion 4 followed by paclitaxel weekly 12  (4) radiation to follow chemotherapy as appropriate  (5) tamoxifen was started neoadjuvantly on 07/13/2014 given the likely delay in definitive surgery while genetics results are pending; this will be held during chemotherapy   PLAN: Manaia and I spent about 25 minutes discussing her upcoming chemotherapy plans. She was made aware of potential side effects and toxicities of both cyclophosphamide and doxorubicin. She attended chemotherapy school earlier today so a lot of this information was a review for her. She was made aware of her neulasta injections, including the fact that these will be administered with an onbody injection that she will need to wear until the device is no longer active.   Finally, we reviewed her antiemetic schedule, and she feels comfortable with every medication listed as well as the indication and potential side effects. These prescriptions were sent to her pharmacy today. I have placed orders for an echo to be performed next week.  Basha will return on 7/26 for her first cycle of treatment. She understands and agrees with this plan. She knows the goal of treatment in her case is cure. She has been encouraged to call with any issues that might arise before her next visit here.  Laurie Panda, NP   09/16/2014 12:13 PM

## 2014-09-18 NOTE — Progress Notes (Unsigned)
[place]  [phone] [phone]) [phone]   ID: Lori [name] DOB: Jul 27, 1965  MR#: [id number]  NOB#:[id number]  Patient Care Team: Delilah Shan, MD as PCP - General (Family Medicine) [name], MD as Consulting Physician (General Surgery) [name], MD as Consulting Physician (Oncology) [name], MD as Attending Physician (Radiation Oncology) [name], RN as Registered Nurse [name], RN as Registered Nurse [name], NP as Nurse Practitioner (Nurse Practitioner) PCP: [name], MD GYN: [name] MD OTHER MD:  CHIEF COMPLAINT: Estrogen receptor positive breast cancer  CURRENT TREATMENT: adjuvant chemotherapy   BREAST CANCER HISTORY: From the original intake note:  Lori [name] (who is the daughter of my former patient, [name], herself now 10 years out from her T1 cN0 invasive ductal carcinoma, treated with anti-estrogens only) [facility] April 20 03/23/2014. Breast density was category C. A possible mass in the left breast upper outer quadrant was felt to warrant further evaluation, and on 07/04/2014 the patient underwent left mammography with tomosynthesis and left breast ultrasonography. At this point the patient felt she was able to palpate a mass in the area in question. Tomosynthesis did reveal an irregular mass in the upper outer left breast measuring 2.3 cm, associated on physical exam with a large area of thickening. Ultrasound of the area in question confirmed an irregular mass at the 1:00 position 5 cm from the nipple measuring 2.2 cm. The left axilla was negative sonographically.  Biopsy of the mass in question the same day, 07/04/2014, showed ([id number] [phone]) an invasive lobular breast cancer which was estrogen receptor 50% positive with strong staining intensity, progesterone receptor 1% positive with moderate staining intensity, with a proliferation  marker of 1%, and no HER-2 amplification, the signals ratio being 1.19 and the number per cell 1.55.  On 07/08/2014 the patient underwent bilateral breast MRI. This showed no abnormal adenopathy and no abnormality in the right breast. In the left breast however there was a 9.3 area of abnormal enhancement involving all quadrants. Within the upper outer quadrant there was a denser area which measured up to 5.1 cm.  The patient's subsequent history is as detailed below  INTERVAL HISTORY: Lori [name] returns today for follow-up of her breast cancer, alone. She had a port placed last week and attended chemotherapy this morning. She is here to learn about her antiemetic schedule in anticipation of doxorubicin and cyclophosphamide on 7/26.   REVIEW OF SYSTEMS: Lori [name] is doing well today. She continues to heal well from surgery and is working with physical therapy to regain her range of motion. She is not using any narcotics for pain. She denies fevers, chills, bleeding, or bruising. A detailed review of systems today was otherwise stable    PAST MEDICAL HISTORY: Past Medical History  Diagnosis Date  . Asthma     last trimester of pregnancy 3 yrs ago-  never had problems since then, no meds  . Breast cancer   . Anxiety     PAST SURGICAL HISTORY: Past Surgical History  Procedure Laterality Date  . Cesarean section      x 2  . Knee arthroscopy Left 07/27/2013    Procedure: LEFT ARTHROSCOPY KNEE WITH MEDIAL MENISCAL DEBRIDEMENT;  Surgeon: Gearlean Alf, MD;  Location: [facility];  Service: Orthopedics;  Laterality: Left;  Marland Kitchen Mastectomy w/ sentinel node biopsy Left 08/10/2014  . Mastectomy w/ sentinel node biopsy Bilateral 08/10/2014  Procedure: MASTECTOMY WITH SENTINEL LYMPH NODE BIOPSY;  Surgeon: [name], MD;  Location: [facility];  Service: General;  Laterality: Bilateral;  . Portacath placement Right 09/07/2014    Procedure: INSERTION PORT-A-CATH;  Surgeon: [name], MD;  Location: [facility];  Service: General;  Laterality: Right;    FAMILY HISTORY Family History  Problem Relation Age of Onset  . Breast cancer Mother 65  . Lymphoma Maternal Aunt 71  . Uterine cancer Paternal Aunt 42  . Uterine cancer Paternal Grandmother 84  . Ovarian cancer Other 70    mat great aunt through [facility]  . Ovarian cancer Maternal Grandmother 65  The patient's father died with congestive heart failure at the age of 23. The patient's mother is living, age 64. She was diagnosed with breast cancer at age 16. The patient's paternal grandmother was diagnosed with uterine cancer at age 27 and her daughter, the patient's paternal aunt, also with uterine cancer at age 66. On the mother's side one maternal aunt was diagnosed with lymphoma and the other with throat cancer. A maternal great aunt at age 66 was diagnosed with ovarian cancer.   GYNECOLOGIC HISTORY:  Patient's last menstrual period was 08/26/2014.  menarche age 32, first live birth age 63, which the patient is aware double as the risk of breast cancer. She is GX P2. She still having regular periods. 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 Her husband OVIDA DELAGARZA the third (goes by "[name]") works in Engineer, technical sales, although currently he is looking for work. Their children are [name] 12 and [name] 3. The patient is not a church attender    ADVANCED DIRECTIVES: Not in place   HEALTH MAINTENANCE: History  Substance Use Topics  . Smoking status: Former Smoker -- 1.00 packs/day for 8 years    Types: Cigarettes    Quit date: 07/20/2000  . Smokeless tobacco: Never Used  . Alcohol Use: Yes     Comment: socially     Colonoscopy:  PAP:  Bone density:  Lipid panel:  No Known Allergies  Current Outpatient Prescriptions  Medication Sig Dispense Refill  . dexamethasone (DECADRON) 4 MG tablet Take 2 tablets by mouth once a day on the day after chemotherapy and then take  2 tablets two times a day for 2 days. Take with food. 30 tablet 1  . HYDROcodone-acetaminophen (NORCO/VICODIN) 5-325 MG per tablet Take 1-2 tablets by mouth every 4 (four) hours as needed for moderate pain or severe pain. (Patient not taking: Reported on 09/16/2014) 50 tablet 0  . ibuprofen (ADVIL,MOTRIN) 200 MG tablet Take 200 mg by mouth every 6 (six) hours as needed.    . lidocaine-prilocaine (EMLA) cream Apply to affected area once 30 g 3  . LORazepam (ATIVAN) 0.5 MG tablet Take 1 tablet (0.5 mg total) by mouth at bedtime. 30 tablet 0  . ondansetron (ZOFRAN) 8 MG tablet Take 1 tablet (8 mg total) by mouth 2 (two) times daily as needed. Start on the third day after chemotherapy. 30 tablet 1  . prochlorperazine (COMPAZINE) 10 MG tablet Take 1 tablet (10 mg total) by mouth every 6 (six) hours as needed (Nausea or vomiting). 30 tablet 1  . tamoxifen (NOLVADEX) 20 MG tablet Take 20 mg by mouth daily.    . traMADol (ULTRAM) 50 MG tablet Take 1-2 tablets (50-100 mg total) by mouth every 6 (six) hours as needed. (Patient not  taking: Reported on 09/16/2014) 30 tablet 0  . zolpidem (AMBIEN) 5 MG tablet Take 1 tablet (5 mg total) by mouth at bedtime as needed for sleep. (Patient not taking: Reported on 09/16/2014) 10 tablet 0   No current facility-administered medications for this visit.    OBJECTIVE: young White woman who appears stated age There were no vitals filed for this visit.   There is no weight on file to calculate BMI.    ECOG FS:1 - Symptomatic but completely ambulatory   Skin: warm, dry  HEENT: sclerae anicteric, conjunctivae pink, oropharynx clear. No thrush or mucositis.  Lymph Nodes: No cervical or supraclavicular lymphadenopathy  Lungs: clear to auscultation bilaterally, no rales, wheezes, or rhonci  Heart: regular rate and rhythm  Abdomen: round, soft, non tender, positive bowel sounds  Musculoskeletal: No focal spinal tenderness, no peripheral edema  Neuro: non focal, well oriented,  positive affect  Breasts: bilateral breasts status post mastectomies. Incision continue to heal well. New right upper chest portacath clean and dry.  LAB RESULTS:  CMP     Component Value Date/Time   NA 139 08/03/2014 1022   NA 140 07/13/2014 1226   K 4.1 08/03/2014 1022   K 4.2 07/13/2014 1226   CL 106 08/03/2014 1022   CO2 25 08/03/2014 1022   CO2 25 07/13/2014 1226   GLUCOSE 89 08/03/2014 1022   GLUCOSE 78 07/13/2014 1226   BUN 10 08/03/2014 1022   BUN 13.3 07/13/2014 1226   CREATININE 0.68 08/03/2014 1022   CREATININE 0.7 07/13/2014 1226   CALCIUM 9.3 08/03/2014 1022   CALCIUM 9.4 07/13/2014 1226   PROT 6.8 07/13/2014 1226   ALBUMIN 3.9 07/13/2014 1226   AST 21 07/13/2014 1226   ALT 30 07/13/2014 1226   ALKPHOS 72 07/13/2014 1226   BILITOT 0.26 07/13/2014 1226   GFRNONAA >60 08/03/2014 1022   GFRAA >60 08/03/2014 1022    INo results found for: SPEP, UPEP  Lab Results  Component Value Date   WBC 7.0 08/03/2014   NEUTROABS 5.6 07/13/2014   HGB 14.1 08/03/2014   HCT 42.4 08/03/2014   MCV 87.4 08/03/2014   PLT 250 08/03/2014      Chemistry      Component Value Date/Time   NA 139 08/03/2014 1022   NA 140 07/13/2014 1226   K 4.1 08/03/2014 1022   K 4.2 07/13/2014 1226   CL 106 08/03/2014 1022   CO2 25 08/03/2014 1022   CO2 25 07/13/2014 1226   BUN 10 08/03/2014 1022   BUN 13.3 07/13/2014 1226   CREATININE 0.68 08/03/2014 1022   CREATININE 0.7 07/13/2014 1226      Component Value Date/Time   CALCIUM 9.3 08/03/2014 1022   CALCIUM 9.4 07/13/2014 1226   ALKPHOS 72 07/13/2014 1226   AST 21 07/13/2014 1226   ALT 30 07/13/2014 1226   BILITOT 0.26 07/13/2014 1226       No results found for: LABCA2  No components found for: LABCA125  No results for input(s): INR in the last 168 hours.  Urinalysis No results found for: COLORURINE, APPEARANCEUR, LABSPEC, PHURINE, GLUCOSEU, HGBUR, BILIRUBINUR, KETONESUR, PROTEINUR, UROBILINOGEN, NITRITE,  LEUKOCYTESUR  STUDIES: Nm Bone Scan Whole Body  08/31/2014   CLINICAL DATA:  Staging breast cancer.  EXAM: NUCLEAR MEDICINE WHOLE BODY BONE SCAN  TECHNIQUE: Whole body anterior and posterior images were obtained approximately 3 hours after intravenous injection of radiopharmaceutical.  RADIOPHARMACEUTICALS:  25 mCi Technetium-61mMDP IV  COMPARISON:  None.  FINDINGS: There  is increased activity noted in the midline between the orbits, possibly related to sinus disease. Increased activity noted in the left knee compatible with degenerative changes. No suspicious areas of bony uptake to suggest bony metastatic disease.  IMPRESSION: No  evidence for bony metastatic disease.  Midline activity in the face suspected to be related to sinus disease.   Electronically Signed   By: Rolm Baptise M.D.   On: 08/31/2014 15:16   Ct Abdomen Pelvis W Contrast  08/31/2014   CLINICAL DATA:  Recent bilateral mastectomy for breast carcinoma. Upper outer quadrant left breast cancer.  EXAM: CT ABDOMEN AND PELVIS WITH CONTRAST  TECHNIQUE: Multidetector CT imaging of the abdomen and pelvis was performed using the standard protocol following bolus administration of intravenous contrast.  CONTRAST:  160mL OMNIPAQUE IOHEXOL 300 MG/ML  SOLN  COMPARISON:  None.  FINDINGS: Lower chest: Lung bases are clear.  Hepatobiliary: No focal hepatic lesion. No biliary duct dilatation. Gallbladder is normal. Common bile duct is normal.  Pancreas: Small 5 mm low-density lesion in the tail the pancreas on image 21, series 20. No ductal dilatation. No pancreatic inflammation.  Spleen: Normal spleen  Adrenals/urinary tract: Adrenal glands and kidneys are normal. The ureters and bladder normal.  Stomach/Bowel: Stomach, small bowel, appendix, and cecum are normal. The colon and rectosigmoid colon are normal.  Vascular/Lymphatic: Abdominal aorta is normal caliber. There is no retroperitoneal or periportal lymphadenopathy. No pelvic lymphadenopathy.   Reproductive: Uterus and ovaries are normal.  Musculoskeletal: No aggressive osseous lesion.  Other: No free fluid.  IMPRESSION: 1. No evidence of metastatic disease in the abdomen pelvis. 2. Small low-density lesion the tail of the pancreas is likely a benign cyst. Consider follow-up MRI 12 months. This recommendation follows ACR consensus guidelines: Managing Incidental Findings on Abdominal CT: White Paper of the ACR Incidental Findings Committee. Joellyn Rued Radiol [phone]   Electronically Signed   By: Suzy Bouchard M.D.   On: 08/31/2014 12:32   Dg Chest Portable 1 View  09/07/2014   CLINICAL DATA:  49 year old female status post porta cath placement. Recent diagnosis of breast cancer. Subsequent encounter.  EXAM: PORTABLE CHEST - 1 VIEW  COMPARISON:  CT Abdomen and Pelvis 08/31/2014  FINDINGS: Portable AP upright view at 1403 hrs. Right chest subclavian approach porta cath. Catheter tip projects 1 vertebral body below the level of the carina, at the lower SVC level. No pneumothorax. Infrahilar streaky opacity slightly greater on the right most resembles atelectasis. Postoperative changes to the left axilla and chest wall. Normal cardiac size and mediastinal contours. No pulmonary edema or pleural effusion.  IMPRESSION: 1. Right chest subclavian approach porta cath placed with no adverse features. 2. Atelectasis.   Electronically Signed   By: Genevie Ann M.D.   On: 09/07/2014 13:42   Dg Fluoro Guide Cv Line-no Report  09/07/2014   CLINICAL DATA:    FLOURO GUIDE CV LINE  Fluoroscopy was utilized by the requesting physician.  No radiographic  interpretation.     ASSESSMENT: 49 y.o. BRCA negative [place] woman status post left breast biopsy 07/04/2014 for a clinical T3 N0, stage IIA invasive lobular breast cancer, grade 2, estrogen receptor positive, progesterone receptor 1% "positive", with an MIB-1 of less than 5% and no HER-2 amplification  (1) genetics testing 07/29/2014 through the OvaNext gene  panel offered by [facility] found no deleterious mutations in ATM, BARD1, BRCA1, BRCA2, BRIP1, CDH1, CHEK2, EPCAM, MLH1, MRE11A, MSH2, MSH6, MUTYH, NBN, NF1, PALB2, PMS2, PTEN, RAD50,  RAD51C, RAD51D, SMARCA4, STK11, or TP53.   (2) status post bilateral mastectomies 08/10/2014 showing  (a) on the right, lobular carcinoma in situ  (b) on the left, a pT3 pN1, stage IIIA invasive lobular breast cancer, HER-2 repeated  (3) chemotherapy to start 09/27/2014,l consisting of standard doxorubicin and cyclophosphamide in dose dense fashion 4 followed by paclitaxel weekly 12  (4) radiation to follow chemotherapy as appropriate  (5) tamoxifen was started neoadjuvantly on 07/13/2014 given the likely delay in definitive surgery while genetics results are pending; this will be held during chemotherapy   PLAN: Lori [name] and I spent about 25 minutes discussing her upcoming chemotherapy plans. She was made aware of potential side effects and toxicities of both cyclophosphamide and doxorubicin. She attended chemotherapy school earlier today so a lot of this information was a review for her. She was made aware of her neulasta injections, including the fact that these will be administered with an onbody injection that she will need to wear until the device is no longer active.   Finally, we reviewed her antiemetic schedule, and she feels comfortable with every medication listed as well as the indication and potential side effects. These prescriptions were sent to her pharmacy today. I have placed orders for an echo to be performed next week.  Lori [name] will return on 7/26 for her first cycle of treatment. She understands and agrees with this plan. She knows the goal of treatment in her case is cure. She has been encouraged to call with any issues that might arise before her next visit here.  [name], MD   09/18/2014 5:41 PM

## 2014-09-19 ENCOUNTER — Other Ambulatory Visit: Payer: Self-pay | Admitting: Nurse Practitioner

## 2014-09-19 ENCOUNTER — Ambulatory Visit: Payer: BLUE CROSS/BLUE SHIELD

## 2014-09-19 DIAGNOSIS — M25612 Stiffness of left shoulder, not elsewhere classified: Secondary | ICD-10-CM

## 2014-09-19 DIAGNOSIS — Z9189 Other specified personal risk factors, not elsewhere classified: Secondary | ICD-10-CM

## 2014-09-19 DIAGNOSIS — C50412 Malignant neoplasm of upper-outer quadrant of left female breast: Secondary | ICD-10-CM

## 2014-09-19 DIAGNOSIS — M25611 Stiffness of right shoulder, not elsewhere classified: Secondary | ICD-10-CM

## 2014-09-19 NOTE — Therapy (Addendum)
[place], [place], [address] Phone: [phone]   Fax:  [phone]  Physical Therapy Treatment  Patient Details  Name: Lori [name] MRN: [id number] Date of Birth: Jun 16, 1965 Referring Provider:  Jovita Kussmaul, MD  Encounter Date: 09/19/2014      PT End of Session - 09/19/14 0849    Visit Number 4   Number of Visits 9   Date for PT Re-Evaluation 10/03/14   PT Start Time 0802   PT Stop Time 0847   PT Time Calculation (min) 45 min   Activity Tolerance Patient tolerated treatment well   Behavior During Therapy [facility] for tasks assessed/performed      Past Medical History  Diagnosis Date  . Asthma     last trimester of pregnancy 3 yrs ago-  never had problems since then, no meds  . Breast cancer   . Anxiety     Past Surgical History  Procedure Laterality Date  . Cesarean section      x 2  . Knee arthroscopy Left 07/27/2013    Procedure: LEFT ARTHROSCOPY KNEE WITH MEDIAL MENISCAL DEBRIDEMENT;  Surgeon: Gearlean Alf, MD;  Location: [facility];  Service: Orthopedics;  Laterality: Left;  Marland Kitchen Mastectomy w/ sentinel node biopsy Left 08/10/2014  . Mastectomy w/ sentinel node biopsy Bilateral 08/10/2014    Procedure: MASTECTOMY WITH SENTINEL LYMPH NODE BIOPSY;  Surgeon: [name], MD;  Location: [place];  Service: General;  Laterality: Bilateral;  . Portacath placement Right 09/07/2014    Procedure: INSERTION PORT-A-CATH;  Surgeon: [name], MD;  Location: [place] [facility];  Service: General;  Laterality: Right;    There were no vitals filed for this visit.  Visit Diagnosis:  Stiffness of joint, shoulder region, left  At risk for lymphedema  Stiffness of joint, shoulder region, right  Carcinoma of upper-outer quadrant of left female breast      Subjective Assessment - 09/19/14 0807    Subjective My port area is feeling better, just have some tenderness under both arms. Not having  any pain right now. Got my "knitted knockers" in the mail last week and I like them, they fill out my bra good when I'm out in public.    Currently in Pain? No/denies                         [facility] Adult PT Treatment/Exercise - 09/19/14 0001    Shoulder Exercises: Standing   ABduction AAROM;Both;5 reps  Using finger ladder; up to 26 on both sides   ABduction Limitations No limitations/pain with Rt UE today.    Shoulder Exercises: Pulleys   Flexion 2 minutes   ABduction 2 minutes   Shoulder Exercises: Therapy Ball   Flexion 10 reps  Roll yellow ball up wall   Flexion Limitations Pt reports less pulling felt at port site with stretching today.   Manual Therapy   Manual Lymphatic Drainage (MLD) short neck, (avoiding port on right side) superficial and deep abdominals, right inguinls, right axillo inguinao- anastamosis, right axillary nodes, right chest and upper arm, left inguinal nodes, left axillo-inguino anastamosis, left upper arm and chest    Passive ROM In supine to bil shoulders into flexion, abduction, and D2 all to pts tolerance.                       Breast Clinic Goals - 07/13/14 1648  Patient will be able to verbalize understanding of pertinent lymphedema risk reduction practices relevant to her diagnosis specifically related to skin care.   Time 1   Period Days   Status Achieved   Patient will be able to return demonstrate and/or verbalize understanding of the post-op home exercise program related to regaining shoulder range of motion.   Time 1   Period Days   Status Achieved   Patient will be able to verbalize understanding of the importance of attending the postoperative After Breast Cancer Class for further lymphedema risk reduction education and therapeutic exercise.   Time 1   Period Days   Status Achieved          Long Term Clinic Goals - 09/15/14 1255    CC Long Term Goal  #1   Title Patient with verbalize an understanding of  lymphedema risk reduction precautions   Status On-going   CC Long Term Goal  #2   Title Patient will be independent in a basic home exercise program   Status On-going   CC Long Term Goal  #3   Title Patient will be knowledgable  in strength ABC  home exercise program and how to progress weight when she is ready    Status On-going   CC Long Term Goal  #4   Title Patient will improve right and left shoulder flexion range of motion to 150 degrees to perform activities of daily living and household chores with greater ease   Status On-going   CC Long Term Goal  #5   Title Patient will improve left shoulder abduction to 120 degrees so that she can achieve position needed for radiation therapy.   Status On-going   CC Long Term Goal  #6   Title Patient will decrease the DASH score to <  25  to demonstrate increased functional use of upper extremity   Status On-going            Plan - 09/19/14 0849    Clinical Impression Statement Pt with much less pain today and tolerated all stretches well. She is also incorporating HEP stretches into her day. Continues to make good progress with minimal limitations at end ROM   Pt will benefit from skilled therapeutic intervention in order to improve on the following deficits Decreased knowledge of precautions;Decreased strength;Decreased knowledge of use of DME;Decreased range of motion;Pain;Impaired UE functional use;Increased edema   Rehab Potential Excellent   Clinical Impairments Affecting Rehab Potential recent surgery with ALND on left    PT Frequency 2x / week   PT Duration 4 weeks   PT Treatment/Interventions ADLs/Self Care Home Management;DME Instruction;Manual techniques;Manual lymph drainage;Therapeutic activities;Therapeutic exercise;Compression bandaging;Passive range of motion;Patient/family education;Taping   PT Next Visit Plan manual lymph drainage A/AA/PROM to both shoulders.be cautious of port   advance home program   Recommended Other  Services Taking ABC class today   Consulted and Agree with Plan of Care Patient        Problem List Patient Active Problem List   Diagnosis Date Noted  . Breast cancer 07/14/2014  . Family history of malignant neoplasm of breast 07/14/2014  . Family history of malignant neoplasm of ovary 07/14/2014  . Family history of uterine cancer 07/14/2014  . Breast cancer of upper-outer quadrant of left female breast 07/07/2014  . Acute medial meniscal tear 07/27/2013    Otelia Limes, PTA 09/19/2014, 8:51 AM      PHYSICAL THERAPY DISCHARGE SUMMARY  Visits from Start of Care: 4  Current functional level related to goals / functional outcomes: Unknown   Remaining deficits: unknown  Education / Equipment: Exercise, self manual lymph drainage  Plan: Patient agrees to discharge.  Patient goals were partially met. Patient is being discharged due to not returning since the last visit.  ?????      Maudry Diego, PT 03/30/2015 3:07 PM               [place], [place], [address] Phone: [phone]   Fax:  [phone]

## 2014-09-20 ENCOUNTER — Other Ambulatory Visit: Payer: BLUE CROSS/BLUE SHIELD

## 2014-09-20 ENCOUNTER — Encounter: Payer: BLUE CROSS/BLUE SHIELD | Admitting: Oncology

## 2014-09-20 ENCOUNTER — Encounter (HOSPITAL_BASED_OUTPATIENT_CLINIC_OR_DEPARTMENT_OTHER): Payer: Self-pay | Admitting: General Surgery

## 2014-09-20 ENCOUNTER — Telehealth: Payer: Self-pay | Admitting: Nurse Practitioner

## 2014-09-20 ENCOUNTER — Other Ambulatory Visit: Payer: Self-pay | Admitting: Oncology

## 2014-09-20 NOTE — Telephone Encounter (Signed)
Left message to confirm appointment for 07/19 was canceled per pof

## 2014-09-21 ENCOUNTER — Other Ambulatory Visit (HOSPITAL_COMMUNITY): Payer: BLUE CROSS/BLUE SHIELD

## 2014-09-22 ENCOUNTER — Ambulatory Visit (HOSPITAL_COMMUNITY)
Admission: RE | Admit: 2014-09-22 | Discharge: 2014-09-22 | Disposition: A | Discharge status: Home / Self Care | Payer: BLUE CROSS/BLUE SHIELD | Source: Ambulatory Visit | Attending: Oncology | Admitting: Oncology

## 2014-09-22 DIAGNOSIS — I517 Cardiomegaly: Secondary | ICD-10-CM | POA: Insufficient documentation

## 2014-09-22 DIAGNOSIS — I340 Nonrheumatic mitral (valve) insufficiency: Secondary | ICD-10-CM | POA: Diagnosis not present

## 2014-09-22 DIAGNOSIS — C50412 Malignant neoplasm of upper-outer quadrant of left female breast: Secondary | ICD-10-CM | POA: Diagnosis present

## 2014-09-22 NOTE — Progress Notes (Signed)
  Echocardiogram 2D Echocardiogram has been performed.  Lori [name] 09/22/2014, 1:58 PM

## 2014-09-23 ENCOUNTER — Encounter: Payer: Self-pay | Admitting: *Deleted

## 2014-09-23 ENCOUNTER — Telehealth: Payer: Self-pay | Admitting: *Deleted

## 2014-09-23 NOTE — Telephone Encounter (Signed)
PT.'S TEMPERATURE THIS MORNING IS 101. NO SIGNS OR SYMPTOMS OF A UPPER RESPIRATORY OR URINARY TRACT INFECTION. NO GASTROINTESTINAL PROBLEMS. PT. HAD A BILATERAL MASTECTOMY SIX WEEKS AGO AND A PORT A CATH PLACEMENT ON 09/09/14. NO SIGNS OF AN INFECTION EITHER AREAS. PT. TO START CHEMOTHERAPY ON 09/27/14. SPOKE TO DR.MAGRINAT'S NURSE, VAL DODD,RN. INSTRUCTED PT.'S HUSBAND THAT PT. NEEDS TO CONTACT HER PRIMARY CARE PHYSICIAN OR HER SURGEON. PT. HAS STOPPED SEEING HER PRIMARY CARE PHYSICIAN SO SUGGESTED AN URGENT CARE FACILITY. INSTRUCTED HUSBAND TO KEEP THIS OFFICE UPDATED WITH PT.'S CONDITION. HE VOICES UNDERSTANDING.

## 2014-09-23 NOTE — Progress Notes (Signed)
Pt went to urgent care d/t fever of 101. No other symptoms were noted. Pt was started on Augmentin x1wk. Per Dr. Jana Hakim to cancel chemo on 7/26 and to start the following week. Informed pt husband of Dr. Virgie Dad orders.

## 2014-09-26 ENCOUNTER — Ambulatory Visit
Admission: RE | Admit: 2014-09-26 | Discharge: 2014-09-26 | Disposition: A | Discharge status: Home / Self Care | Payer: BLUE CROSS/BLUE SHIELD | Source: Ambulatory Visit | Attending: General Surgery | Admitting: General Surgery

## 2014-09-26 ENCOUNTER — Other Ambulatory Visit: Payer: Self-pay | Admitting: General Surgery

## 2014-09-26 DIAGNOSIS — M79622 Pain in left upper arm: Secondary | ICD-10-CM

## 2014-09-26 DIAGNOSIS — M25612 Stiffness of left shoulder, not elsewhere classified: Secondary | ICD-10-CM | POA: Diagnosis not present

## 2014-09-27 ENCOUNTER — Other Ambulatory Visit: Payer: BLUE CROSS/BLUE SHIELD

## 2014-09-27 ENCOUNTER — Ambulatory Visit: Payer: BLUE CROSS/BLUE SHIELD

## 2014-09-28 ENCOUNTER — Encounter: Payer: BLUE CROSS/BLUE SHIELD | Admitting: Physical Therapy

## 2014-09-30 ENCOUNTER — Encounter: Payer: Self-pay | Admitting: *Deleted

## 2014-09-30 ENCOUNTER — Telehealth: Payer: Self-pay | Admitting: *Deleted

## 2014-09-30 LAB — CULTURE, ROUTINE-ABSCESS

## 2014-09-30 NOTE — Telephone Encounter (Signed)
Received call from pt husband concerning results from hematoma that was drained on Monday. Called [facility] to speak to Dr. Ethlyn Gallery nurse and received update that Dr. Lucia Gaskins gave recommendations for a wk of abx and f/u with Dr. Marlou Starks. Informed pt husband that they will be receiving a call with instructions from Dr. Ethlyn Gallery office. Pt scheduled to see Dr. Marlou Starks on 8/2. Will inform Dr. Jana Hakim on Monday when he returns, discussed chemo will be delayed for at least one more week. Received verbal understanding. Encourage pt to call with needs or further questions.

## 2014-10-03 ENCOUNTER — Ambulatory Visit: Payer: BLUE CROSS/BLUE SHIELD | Admitting: Physical Therapy

## 2014-10-04 ENCOUNTER — Ambulatory Visit: Payer: BLUE CROSS/BLUE SHIELD

## 2014-10-04 ENCOUNTER — Other Ambulatory Visit: Payer: BLUE CROSS/BLUE SHIELD

## 2014-10-04 ENCOUNTER — Ambulatory Visit: Payer: BLUE CROSS/BLUE SHIELD | Admitting: Nurse Practitioner

## 2014-10-05 ENCOUNTER — Ambulatory Visit: Payer: BLUE CROSS/BLUE SHIELD | Attending: General Surgery | Admitting: Physical Therapy

## 2014-10-11 ENCOUNTER — Ambulatory Visit (HOSPITAL_BASED_OUTPATIENT_CLINIC_OR_DEPARTMENT_OTHER): Payer: BLUE CROSS/BLUE SHIELD

## 2014-10-11 ENCOUNTER — Other Ambulatory Visit (HOSPITAL_BASED_OUTPATIENT_CLINIC_OR_DEPARTMENT_OTHER): Payer: BLUE CROSS/BLUE SHIELD

## 2014-10-11 ENCOUNTER — Other Ambulatory Visit: Payer: Self-pay | Admitting: Nurse Practitioner

## 2014-10-11 ENCOUNTER — Encounter: Payer: Self-pay | Admitting: *Deleted

## 2014-10-11 ENCOUNTER — Other Ambulatory Visit: Payer: BLUE CROSS/BLUE SHIELD

## 2014-10-11 ENCOUNTER — Ambulatory Visit (HOSPITAL_BASED_OUTPATIENT_CLINIC_OR_DEPARTMENT_OTHER): Payer: BLUE CROSS/BLUE SHIELD | Admitting: Nurse Practitioner

## 2014-10-11 VITALS — BP 117/62 | HR 77 | Temp 98.2°F | Resp 18 | Ht 68.0 in | Wt 213.5 lb

## 2014-10-11 DIAGNOSIS — Z5189 Encounter for other specified aftercare: Secondary | ICD-10-CM

## 2014-10-11 DIAGNOSIS — C50412 Malignant neoplasm of upper-outer quadrant of left female breast: Secondary | ICD-10-CM

## 2014-10-11 DIAGNOSIS — D0501 Lobular carcinoma in situ of right breast: Secondary | ICD-10-CM

## 2014-10-11 DIAGNOSIS — Z170 Estrogen receptor positive status [ER+]: Secondary | ICD-10-CM

## 2014-10-11 DIAGNOSIS — Z5111 Encounter for antineoplastic chemotherapy: Secondary | ICD-10-CM | POA: Diagnosis not present

## 2014-10-11 DIAGNOSIS — Z452 Encounter for adjustment and management of vascular access device: Secondary | ICD-10-CM

## 2014-10-11 LAB — CBC WITH DIFFERENTIAL/PLATELET
BASO%: 2 % (ref 0.0–2.0)
Basophils Absolute: 0.2 10*3/uL — ABNORMAL HIGH (ref 0.0–0.1)
EOS ABS: 0.2 10*3/uL (ref 0.0–0.5)
EOS%: 2.2 % (ref 0.0–7.0)
HEMATOCRIT: 40.1 % (ref 34.8–46.6)
HGB: 13.4 g/dL (ref 11.6–15.9)
LYMPH#: 2.4 10*3/uL (ref 0.9–3.3)
LYMPH%: 27.6 % (ref 14.0–49.7)
MCH: 29.2 pg (ref 25.1–34.0)
MCHC: 33.3 g/dL (ref 31.5–36.0)
MCV: 87.6 fL (ref 79.5–101.0)
MONO#: 0.7 10*3/uL (ref 0.1–0.9)
MONO%: 7.9 % (ref 0.0–14.0)
NEUT%: 60.3 % (ref 38.4–76.8)
NEUTROS ABS: 5.3 10*3/uL (ref 1.5–6.5)
PLATELETS: 269 10*3/uL (ref 145–400)
RBC: 4.58 10*6/uL (ref 3.70–5.45)
RDW: 13.3 % (ref 11.2–14.5)
WBC: 8.8 10*3/uL (ref 3.9–10.3)

## 2014-10-11 LAB — COMPREHENSIVE METABOLIC PANEL (CC13)
ALT: 35 U/L (ref 0–55)
AST: 21 U/L (ref 5–34)
Albumin: 3.7 g/dL (ref 3.5–5.0)
Alkaline Phosphatase: 112 U/L (ref 40–150)
Anion Gap: 7 mEq/L (ref 3–11)
BUN: 13.7 mg/dL (ref 7.0–26.0)
CO2: 24 mEq/L (ref 22–29)
CREATININE: 0.7 mg/dL (ref 0.6–1.1)
Calcium: 9.1 mg/dL (ref 8.4–10.4)
Chloride: 110 mEq/L — ABNORMAL HIGH (ref 98–109)
EGFR: >90 mL/min/{1.73_m2} (ref >90)
Glucose: 95 mg/dl (ref 70–140)
POTASSIUM: 4.2 meq/L (ref 3.5–5.1)
Sodium: 142 mEq/L (ref 136–145)
TOTAL PROTEIN: 6.9 g/dL (ref 6.4–8.3)
Total Bilirubin: 0.44 mg/dL (ref 0.20–1.20)

## 2014-10-11 MED ORDER — SODIUM CHLORIDE 0.9 % IJ SOLN
10.0000 mL | INTRAMUSCULAR | Status: DC | PRN
  Administered 2014-10-11: 10 mL
  Filled 2014-10-11: qty 10

## 2014-10-11 MED ORDER — PEGFILGRASTIM 6 MG/0.6ML ~~LOC~~ PSKT
6.0000 mg | PREFILLED_SYRINGE | Freq: Once | SUBCUTANEOUS | Status: AC
  Administered 2014-10-11: 6 mg via SUBCUTANEOUS
  Filled 2014-10-11: qty 0.6

## 2014-10-11 MED ORDER — FOSAPREPITANT DIMEGLUMINE INJECTION 150 MG
Freq: Once | INTRAVENOUS | Status: AC
  Administered 2014-10-11: 13:00:00 via INTRAVENOUS
  Filled 2014-10-11: qty 5

## 2014-10-11 MED ORDER — PALONOSETRON HCL INJECTION 0.25 MG/5ML
0.2500 mg | Freq: Once | INTRAVENOUS | Status: AC
  Administered 2014-10-11: 0.25 mg via INTRAVENOUS

## 2014-10-11 MED ORDER — PALONOSETRON HCL INJECTION 0.25 MG/5ML
INTRAVENOUS | Status: AC
  Filled 2014-10-11: qty 5

## 2014-10-11 MED ORDER — DOXORUBICIN HCL CHEMO IV INJECTION 2 MG/ML
60.0000 mg/m2 | Freq: Once | INTRAVENOUS | Status: AC
  Administered 2014-10-11: 128 mg via INTRAVENOUS
  Filled 2014-10-11: qty 64

## 2014-10-11 MED ORDER — SODIUM CHLORIDE 0.9 % IV SOLN
Freq: Once | INTRAVENOUS | Status: AC
  Administered 2014-10-11: 13:00:00 via INTRAVENOUS

## 2014-10-11 MED ORDER — ALTEPLASE 2 MG IJ SOLR
2.0000 mg | Freq: Once | INTRAMUSCULAR | Status: AC | PRN
  Administered 2014-10-11: 2 mg
  Filled 2014-10-11: qty 2

## 2014-10-11 MED ORDER — CYCLOPHOSPHAMIDE CHEMO INJECTION 1 GM
600.0000 mg/m2 | Freq: Once | INTRAMUSCULAR | Status: AC
  Administered 2014-10-11: 1280 mg via INTRAVENOUS
  Filled 2014-10-11: qty 64

## 2014-10-11 MED ORDER — HEPARIN SOD (PORK) LOCK FLUSH 100 UNIT/ML IV SOLN
500.0000 [IU] | Freq: Once | INTRAVENOUS | Status: AC | PRN
  Administered 2014-10-11: 500 [IU]
  Filled 2014-10-11: qty 5

## 2014-10-11 NOTE — Patient Instructions (Signed)
Doxorubicin injection What is this medicine? DOXORUBICIN (dox oh ROO bi sin) is a chemotherapy drug. It is used to treat many kinds of cancer like Hodgkin's disease, leukemia, non-Hodgkin's lymphoma, neuroblastoma, sarcoma, and Wilms' tumor. It is also used to treat bladder cancer, breast cancer, lung cancer, ovarian cancer, stomach cancer, and thyroid cancer. This medicine may be used for other purposes; ask your health care provider or pharmacist if you have questions. COMMON BRAND NAME(S): Adriamycin, Adriamycin PFS, Adriamycin RDF, Rubex What should I tell my health care provider before I take this medicine? They need to know if you have any of these conditions: -blood disorders -heart disease, recent heart attack -infection (especially a virus infection such as chickenpox, cold sores, or herpes) -irregular heartbeat -liver disease -recent or ongoing radiation therapy -an unusual or allergic reaction to doxorubicin, other chemotherapy agents, other medicines, foods, dyes, or preservatives -pregnant or trying to get pregnant -breast-feeding How should I use this medicine? This drug is given as an infusion into a vein. It is administered in a hospital or clinic by a specially trained health care professional. If you have pain, swelling, burning or any unusual feeling around the site of your injection, tell your health care professional right away. Talk to your pediatrician regarding the use of this medicine in children. Special care may be needed. Overdosage: If you think you have taken too much of this medicine contact a poison control center or emergency room at once. NOTE: This medicine is only for you. Do not share this medicine with others. What if I miss a dose? It is important not to miss your dose. Call your doctor or health care professional if you are unable to keep an appointment. What may interact with this medicine? Do not take this medicine with any of the following  medications: -cisapride -droperidol -halofantrine -pimozide -zidovudine This medicine may also interact with the following medications: -chloroquine -chlorpromazine -clarithromycin -cyclophosphamide -cyclosporine -erythromycin -medicines for depression, anxiety, or psychotic disturbances -medicines for irregular heart beat like amiodarone, bepridil, dofetilide, encainide, flecainide, propafenone, quinidine -medicines for seizures like ethotoin, fosphenytoin, phenytoin -medicines for nausea, vomiting like dolasetron, ondansetron, palonosetron -medicines to increase blood counts like filgrastim, pegfilgrastim, sargramostim -methadone -methotrexate -pentamidine -progesterone -vaccines -verapamil Talk to your doctor or health care professional before taking any of these medicines: -acetaminophen -aspirin -ibuprofen -ketoprofen -naproxen This list may not describe all possible interactions. Give your health care provider a list of all the medicines, herbs, non-prescription drugs, or dietary supplements you use. Also tell them if you smoke, drink alcohol, or use illegal drugs. Some items may interact with your medicine. What should I watch for while using this medicine? Your condition will be monitored carefully while you are receiving this medicine. You will need important blood work done while you are taking this medicine. This drug may make you feel generally unwell. This is not uncommon, as chemotherapy can affect healthy cells as well as cancer cells. Report any side effects. Continue your course of treatment even though you feel ill unless your doctor tells you to stop. Your urine may turn red for a few days after your dose. This is not blood. If your urine is dark or brown, call your doctor. In some cases, you may be given additional medicines to help with side effects. Follow all directions for their use. Call your doctor or health care professional for advice if you get a  fever, chills or sore throat, or other symptoms of a cold or flu. Do not   treat yourself. This drug decreases your body's ability to fight infections. Try to avoid being around people who are sick. This medicine may increase your risk to bruise or bleed. Call your doctor or health care professional if you notice any unusual bleeding. Be careful brushing and flossing your teeth or using a toothpick because you may get an infection or bleed more easily. If you have any dental work done, tell your dentist you are receiving this medicine. Avoid taking products that contain aspirin, acetaminophen, ibuprofen, naproxen, or ketoprofen unless instructed by your doctor. These medicines may hide a fever. Men and women of childbearing age should use effective birth control methods while using taking this medicine. Do not become pregnant while taking this medicine. There is a potential for serious side effects to an unborn child. Talk to your health care professional or pharmacist for more information. Do not breast-feed an infant while taking this medicine. Do not let others touch your urine or other body fluids for 5 days after each treatment with this medicine. Caregivers should wear latex gloves to avoid touching body fluids during this time. There is a maximum amount of this medicine you should receive throughout your life. The amount depends on the medical condition being treated and your overall health. Your doctor will watch how much of this medicine you receive in your lifetime. Tell your doctor if you have taken this medicine before. What side effects may I notice from receiving this medicine? Side effects that you should report to your doctor or health care professional as soon as possible: -allergic reactions like skin rash, itching or hives, swelling of the face, lips, or tongue -low blood counts - this medicine may decrease the number of white blood cells, red blood cells and platelets. You may be at  increased risk for infections and bleeding. -signs of infection - fever or chills, cough, sore throat, pain or difficulty passing urine -signs of decreased platelets or bleeding - bruising, pinpoint red spots on the skin, black, tarry stools, blood in the urine -signs of decreased red blood cells - unusually weak or tired, fainting spells, lightheadedness -breathing problems -chest pain -fast, irregular heartbeat -mouth sores -nausea, vomiting -pain, swelling, redness at site where injected -pain, tingling, numbness in the hands or feet -swelling of ankles, feet, or hands -unusual bleeding or bruising Side effects that usually do not require medical attention (report to your doctor or health care professional if they continue or are bothersome): -diarrhea -facial flushing -hair loss -loss of appetite -missed menstrual periods -nail discoloration or damage -red or watery eyes -red colored urine -stomach upset This list may not describe all possible side effects. Call your doctor for medical advice about side effects. You may report side effects to FDA at 1-800-FDA-1088. Where should I keep my medicine? This drug is given in a hospital or clinic and will not be stored at home. NOTE: This sheet is a summary. It may not cover all possible information. If you have questions about this medicine, talk to your doctor, pharmacist, or health care provider.  2015, [employer] Standard. (2012-06-16 09:54:34)  Cyclophosphamide injection What is this medicine? CYCLOPHOSPHAMIDE (sye kloe FOSS fa mide) is a chemotherapy drug. It slows the growth of cancer cells. This medicine is used to treat many types of cancer like lymphoma, myeloma, leukemia, breast cancer, and ovarian cancer, to name a few. This medicine may be used for other purposes; ask your health care provider or pharmacist if you have questions. COMMON BRAND NAME(S):   Cytoxan, Neosar What should I tell my health care provider before I  take this medicine? They need to know if you have any of these conditions: -blood disorders -history of other chemotherapy -infection -kidney disease -liver disease -recent or ongoing radiation therapy -tumors in the bone marrow -an unusual or allergic reaction to cyclophosphamide, other chemotherapy, other medicines, foods, dyes, or preservatives -pregnant or trying to get pregnant -breast-feeding How should I use this medicine? This drug is usually given as an injection into a vein or muscle or by infusion into a vein. It is administered in a hospital or clinic by a specially trained health care professional. Talk to your pediatrician regarding the use of this medicine in children. Special care may be needed. Overdosage: If you think you have taken too much of this medicine contact a poison control center or emergency room at once. NOTE: This medicine is only for you. Do not share this medicine with others. What if I miss a dose? It is important not to miss your dose. Call your doctor or health care professional if you are unable to keep an appointment. What may interact with this medicine? This medicine may interact with the following medications: -amiodarone -amphotericin B -azathioprine -certain antiviral medicines for HIV or AIDS such as protease inhibitors (e.g., indinavir, ritonavir) and zidovudine -certain blood pressure medications such as benazepril, captopril, enalapril, fosinopril, lisinopril, moexipril, monopril, perindopril, quinapril, ramipril, trandolapril -certain cancer medications such as anthracyclines (e.g., daunorubicin, doxorubicin), busulfan, cytarabine, paclitaxel, pentostatin, tamoxifen, trastuzumab -certain diuretics such as chlorothiazide, chlorthalidone, hydrochlorothiazide, indapamide, metolazone -certain medicines that treat or prevent blood clots like warfarin -certain muscle relaxants such as  succinylcholine -cyclosporine -etanercept -indomethacin -medicines to increase blood counts like filgrastim, pegfilgrastim, sargramostim -medicines used as general anesthesia -metronidazole -natalizumab This list may not describe all possible interactions. Give your health care provider a list of all the medicines, herbs, non-prescription drugs, or dietary supplements you use. Also tell them if you smoke, drink alcohol, or use illegal drugs. Some items may interact with your medicine. What should I watch for while using this medicine? Visit your doctor for checks on your progress. This drug may make you feel generally unwell. This is not uncommon, as chemotherapy can affect healthy cells as well as cancer cells. Report any side effects. Continue your course of treatment even though you feel ill unless your doctor tells you to stop. Drink water or other fluids as directed. Urinate often, even at night. In some cases, you may be given additional medicines to help with side effects. Follow all directions for their use. Call your doctor or health care professional for advice if you get a fever, chills or sore throat, or other symptoms of a cold or flu. Do not treat yourself. This drug decreases your body's ability to fight infections. Try to avoid being around people who are sick. This medicine may increase your risk to bruise or bleed. Call your doctor or health care professional if you notice any unusual bleeding. Be careful brushing and flossing your teeth or using a toothpick because you may get an infection or bleed more easily. If you have any dental work done, tell your dentist you are receiving this medicine. You may get drowsy or dizzy. Do not drive, use machinery, or do anything that needs mental alertness until you know how this medicine affects you. Do not become pregnant while taking this medicine or for 1 year after stopping it. Women should inform their doctor if they wish to become    pregnant or think they might be pregnant. Men should not father a child while taking this medicine and for 4 months after stopping it. There is a potential for serious side effects to an unborn child. Talk to your health care professional or pharmacist for more information. Do not breast-feed an infant while taking this medicine. This medicine may interfere with the ability to have a child. This medicine has caused ovarian failure in some women. This medicine has caused reduced sperm counts in some men. You should talk with your doctor or health care professional if you are concerned about your fertility. If you are going to have surgery, tell your doctor or health care professional that you have taken this medicine. What side effects may I notice from receiving this medicine? Side effects that you should report to your doctor or health care professional as soon as possible: -allergic reactions like skin rash, itching or hives, swelling of the face, lips, or tongue -low blood counts - this medicine may decrease the number of white blood cells, red blood cells and platelets. You may be at increased risk for infections and bleeding. -signs of infection - fever or chills, cough, sore throat, pain or difficulty passing urine -signs of decreased platelets or bleeding - bruising, pinpoint red spots on the skin, black, tarry stools, blood in the urine -signs of decreased red blood cells - unusually weak or tired, fainting spells, lightheadedness -breathing problems -dark urine -dizziness -palpitations -swelling of the ankles, feet, hands -trouble passing urine or change in the amount of urine -weight gain -yellowing of the eyes or skin Side effects that usually do not require medical attention (report to your doctor or health care professional if they continue or are bothersome): -changes in nail or skin color -hair loss -missed menstrual periods -mouth sores -nausea, vomiting This list may not  describe all possible side effects. Call your doctor for medical advice about side effects. You may report side effects to FDA at 1-800-FDA-1088. Where should I keep my medicine? This drug is given in a hospital or clinic and will not be stored at home. NOTE: This sheet is a summary. It may not cover all possible information. If you have questions about this medicine, talk to your doctor, pharmacist, or health care provider.  2015, [employer] Standard. (2012-01-03 16:22:58)  [facility] Discharge Instructions for Patients Receiving Chemotherapy  Today you received the following chemotherapy agents Adriamycin/Cytoxan.  To help prevent nausea and vomiting after your treatment, we encourage you to take your nausea medication as directed.   If you develop nausea and vomiting that is not controlled by your nausea medication, call the clinic.   BELOW ARE SYMPTOMS THAT SHOULD BE REPORTED IMMEDIATELY:  *FEVER GREATER THAN 100.5 F  *CHILLS WITH OR WITHOUT FEVER  NAUSEA AND VOMITING THAT IS NOT CONTROLLED WITH YOUR NAUSEA MEDICATION  *UNUSUAL SHORTNESS OF BREATH  *UNUSUAL BRUISING OR BLEEDING  TENDERNESS IN MOUTH AND THROAT WITH OR WITHOUT PRESENCE OF ULCERS  *URINARY PROBLEMS  *BOWEL PROBLEMS  UNUSUAL RASH Items with * indicate a potential emergency and should be followed up as soon as possible.  Feel free to call the clinic you have any questions or concerns. The clinic phone number is [phone].  Please show the [place] at check-in to the Emergency Department and triage nurse.

## 2014-10-11 NOTE — Progress Notes (Signed)
Initially upon PAC access, PAC found to be positional.  After some maneuvering, swift blood return noted and infusion initiated.  At start of Adriamycin infusion, no blood return noted.  Adriamycin delayed and after no blood return pt was reaccessed.  Pt still found to have no blood return after reaccess and cathflo instilled and dwelled.  After 1.5 hrs of dwelling, swift blood return noted and Adriamycin administered with no complications and blood return throughout duration of chemo push.  Swift blood return still noted at end of infusion and prior to deaccess.

## 2014-10-11 NOTE — Progress Notes (Signed)
[place]  Telephone:([phone]) [phone] [phone]) [phone]   ID: ELNORE COSENS DOB: April 13, 1965  MR#: [id number]  GYK#:[id number]  Patient Care Team: Delilah Shan, MD as PCP - General (Family Medicine) [name], MD as Consulting Physician (General Surgery) [name], MD as Consulting Physician (Oncology) [name], MD as Attending Physician (Radiation Oncology) [name], RN as Registered Nurse [name], RN as Registered Nurse [name], NP as Nurse Practitioner (Nurse Practitioner) PCP: [name], MD GYN: [name] MD OTHER MD:  CHIEF COMPLAINT: Estrogen receptor positive breast cancer  CURRENT TREATMENT: adjuvant chemotherapy  BREAST CANCER HISTORY: From the original intake note:  [name] (who is the daughter of my former patient, [name], herself now 10 years out from her T1 cN0 invasive ductal carcinoma, treated with anti-estrogens only) [facility] April 20 03/23/2014. Breast density was category C. A possible mass in the left breast upper outer quadrant was felt to warrant further evaluation, and on 07/04/2014 the patient underwent left mammography with tomosynthesis and left breast ultrasonography. At this point the patient felt she was able to palpate a mass in the area in question. Tomosynthesis did reveal an irregular mass in the upper outer left breast measuring 2.3 cm, associated on physical exam with a large area of thickening. Ultrasound of the area in question confirmed an irregular mass at the 1:00 position 5 cm from the nipple measuring 2.2 cm. The left axilla was negative sonographically.  Biopsy of the mass in question the same day, 07/04/2014, showed ([phone]) an invasive lobular breast cancer which was estrogen receptor 50% positive with strong staining intensity, progesterone receptor 1% positive with moderate staining intensity, with a proliferation  marker of 1%, and no HER-2 amplification, the signals ratio being 1.19 and the number per cell 1.55.  On 07/08/2014 the patient underwent bilateral breast MRI. This showed no abnormal adenopathy and no abnormality in the right breast. In the left breast however there was a 9.3 area of abnormal enhancement involving all quadrants. Within the upper outer quadrant there was a denser area which measured up to 5.1 cm.  The patient's subsequent history is as detailed below  INTERVAL HISTORY: [name] returns today for follow-up of her breast cancer, accompanied by her husband. She is due to begin cycle 1 of doxorubicin and cyclophosphamide, with neulasta given on day 2 for granulocyte support. This start date has been delayed a few times because of a left axilla infection after surgery. She has been treated with antibiotics, and has no more pain or fevers.   REVIEW OF SYSTEMS: Besides anxiety about starting chemo, a detailed review of systems is otherwise entirely negative.     PAST MEDICAL HISTORY: Past Medical History  Diagnosis Date  . Asthma     last trimester of pregnancy 3 yrs ago-  never had problems since then, no meds  . Breast cancer   . Anxiety     PAST SURGICAL HISTORY: Past Surgical History  Procedure Laterality Date  . Cesarean section      x 2  . Knee arthroscopy Left 07/27/2013    Procedure: LEFT ARTHROSCOPY KNEE WITH MEDIAL MENISCAL DEBRIDEMENT;  Surgeon: Gearlean Alf, MD;  Location: [facility];  Service: Orthopedics;  Laterality: Left;  Marland Kitchen Mastectomy w/ sentinel node biopsy Left 08/10/2014  . Mastectomy w/ sentinel node biopsy Bilateral 08/10/2014    Procedure: MASTECTOMY WITH SENTINEL LYMPH NODE BIOPSY;  Surgeon: Eddie Dibbles  Daiva Nakayama, MD;  Location: [facility];  Service: General;  Laterality: Bilateral;  . Portacath placement Right 09/07/2014    Procedure: INSERTION PORT-A-CATH;  Surgeon: [name], MD;  Location: [place];  Service: General;  Laterality: Right;     FAMILY HISTORY Family History  Problem Relation Age of Onset  . Breast cancer Mother 70  . Lymphoma Maternal Aunt 71  . Uterine cancer Paternal Aunt 60  . Uterine cancer Paternal Grandmother 53  . Ovarian cancer Other 68    mat great aunt through [facility]  . Ovarian cancer Maternal Grandmother 15  The patient's father died with congestive heart failure at the age of 62. The patient's mother is living, age 47. She was diagnosed with breast cancer at age 67. The patient's paternal grandmother was diagnosed with uterine cancer at age 16 and her daughter, the patient's paternal aunt, also with uterine cancer at age 52. On the mother's side one maternal aunt was diagnosed with lymphoma and the other with throat cancer. A maternal great aunt at age 21 was diagnosed with ovarian cancer.   GYNECOLOGIC HISTORY:  No LMP recorded.  menarche age 49, first live birth age 30, which the patient is aware double as the risk of breast cancer. She is GX P2. She still having regular periods. 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 Her husband ALAYAH KNOUFF the third (goes by "[name]") works in Engineer, technical sales, although currently he is looking for work. Their children are [name] 12 and [name] 3. The patient is not a church attender    ADVANCED DIRECTIVES: Not in place   HEALTH MAINTENANCE: History  Substance Use Topics  . Smoking status: Former Smoker -- 1.00 packs/day for 8 years    Types: Cigarettes    Quit date: 07/20/2000  . Smokeless tobacco: Never Used  . Alcohol Use: Yes     Comment: socially     Colonoscopy:  PAP:  Bone density:  Lipid panel:  No Known Allergies  Current Outpatient Prescriptions  Medication Sig Dispense Refill  . dexamethasone (DECADRON) 4 MG tablet Take 2 tablets by mouth once a day on the day after chemotherapy and then take 2 tablets two times a day for 2 days. Take with food. 30 tablet 1  . Docusate Sodium  (DULCOLAX STOOL SOFTENER PO) Take 1 tablet by mouth daily.    Marland Kitchen ibuprofen (ADVIL,MOTRIN) 200 MG tablet Take 600 mg by mouth every 6 (six) hours as needed.     . lidocaine-prilocaine (EMLA) cream Apply to affected area once 30 g 3  . loratadine (CLARITIN) 10 MG tablet Take 10 mg by mouth daily.    Marland Kitchen LORazepam (ATIVAN) 0.5 MG tablet Take 1 tablet (0.5 mg total) by mouth at bedtime. 30 tablet 0  . ondansetron (ZOFRAN) 8 MG tablet Take 1 tablet (8 mg total) by mouth 2 (two) times daily as needed. Start on the third day after chemotherapy. 30 tablet 1  . Probiotic Product (ALIGN PO) Take 1 capsule by mouth daily.    . prochlorperazine (COMPAZINE) 10 MG tablet Take 1 tablet (10 mg total) by mouth every 6 (six) hours as needed (Nausea or vomiting). 30 tablet 1  . HYDROcodone-acetaminophen (NORCO/VICODIN) 5-325 MG per tablet Take 1-2 tablets by mouth every 4 (four) hours as needed for moderate pain or severe pain. (Patient not taking: Reported on 09/16/2014) 50 tablet 0  . tamoxifen (NOLVADEX) 20  MG tablet Take 20 mg by mouth daily.    Marland Kitchen zolpidem (AMBIEN) 5 MG tablet Take 1 tablet (5 mg total) by mouth at bedtime as needed for sleep. (Patient not taking: Reported on 09/16/2014) 10 tablet 0   No current facility-administered medications for this visit.   Facility-Administered Medications Ordered in Other Visits  Medication Dose Route Frequency Provider Last Rate Last Dose  . cyclophosphamide (CYTOXAN) 1,280 mg in sodium chloride 0.9 % 250 mL chemo infusion  600 mg/m2 (Treatment Plan Actual) Intravenous Once [name], MD      . DOXOrubicin (ADRIAMYCIN) chemo injection 128 mg  60 mg/m2 (Treatment Plan Actual) Intravenous Once [name], MD      . fosaprepitant (EMEND) 150 mg, dexamethasone (DECADRON) 12 mg in sodium chloride 0.9 % 145 mL IVPB   Intravenous Once [name], MD      . heparin lock flush 100 unit/mL  500 Units Intracatheter Once PRN [name], MD      .  palonosetron (ALOXI) injection 0.25 mg  0.25 mg Intravenous Once [name], MD      . pegfilgrastim (NEULASTA ONPRO KIT) injection 6 mg  6 mg Subcutaneous Once [name], MD      . sodium chloride 0.9 % injection 10 mL  10 mL Intracatheter PRN [name], MD        OBJECTIVE: young White woman who appears stated age 49 Vitals:   10/11/14 1106  BP: 117/62  Pulse: 77  Temp: 98.2 F (36.8 C)  Resp: 18     Body mass index is 32.47 kg/(m^2).    ECOG FS:1 - Symptomatic but completely ambulatory   Skin: warm, dry  HEENT: sclerae anicteric, conjunctivae pink, oropharynx clear. No thrush or mucositis.  Lymph Nodes: No cervical or supraclavicular lymphadenopathy  Lungs: clear to auscultation bilaterally, no rales, wheezes, or rhonci  Heart: regular rate and rhythm  Abdomen: round, soft, non tender, positive bowel sounds  Musculoskeletal: No focal spinal tenderness, no peripheral edema  Neuro: non focal, well oriented, positive affect  Breasts: bilateral breasts status post mastectomies. Incision continue to heal well. New right upper chest portacath clean and dry. No sign of infection or tenderness to left axilla.  LAB RESULTS:  CMP     Component Value Date/Time   NA 142 10/11/2014 1048   NA 139 08/03/2014 1022   K 4.2 10/11/2014 1048   K 4.1 08/03/2014 1022   CL 106 08/03/2014 1022   CO2 24 10/11/2014 1048   CO2 25 08/03/2014 1022   GLUCOSE 95 10/11/2014 1048   GLUCOSE 89 08/03/2014 1022   BUN 13.7 10/11/2014 1048   BUN 10 08/03/2014 1022   CREATININE 0.7 10/11/2014 1048   CREATININE 0.68 08/03/2014 1022   CALCIUM 9.1 10/11/2014 1048   CALCIUM 9.3 08/03/2014 1022   PROT 6.9 10/11/2014 1048   ALBUMIN 3.7 10/11/2014 1048   AST 21 10/11/2014 1048   ALT 35 10/11/2014 1048   ALKPHOS 112 10/11/2014 1048   BILITOT 0.44 10/11/2014 1048   GFRNONAA >60 08/03/2014 1022   GFRAA >60 08/03/2014 1022    INo results found for: SPEP, UPEP  Lab Results    Component Value Date   WBC 8.8 10/11/2014   NEUTROABS 5.3 10/11/2014   HGB 13.4 10/11/2014   HCT 40.1 10/11/2014   MCV 87.6 10/11/2014   PLT 269 10/11/2014      Chemistry      Component Value Date/Time  NA 142 10/11/2014 1048   NA 139 08/03/2014 1022   K 4.2 10/11/2014 1048   K 4.1 08/03/2014 1022   CL 106 08/03/2014 1022   CO2 24 10/11/2014 1048   CO2 25 08/03/2014 1022   BUN 13.7 10/11/2014 1048   BUN 10 08/03/2014 1022   CREATININE 0.7 10/11/2014 1048   CREATININE 0.68 08/03/2014 1022      Component Value Date/Time   CALCIUM 9.1 10/11/2014 1048   CALCIUM 9.3 08/03/2014 1022   ALKPHOS 112 10/11/2014 1048   AST 21 10/11/2014 1048   ALT 35 10/11/2014 1048   BILITOT 0.44 10/11/2014 1048       No results found for: LABCA2  No components found for: LABCA125  No results for input(s): INR in the last 168 hours.  Urinalysis No results found for: COLORURINE, APPEARANCEUR, LABSPEC, PHURINE, GLUCOSEU, HGBUR, BILIRUBINUR, KETONESUR, PROTEINUR, UROBILINOGEN, NITRITE, LEUKOCYTESUR  STUDIES: [place]  09/26/2014   CLINICAL DATA:  Enlarging, palpable mass in the anterior left axilla with fever. The patient was started on Augmentin 3 days ago. Status post left mastectomy for breast cancer on 08/10/2014.  EXAM: ULTRASOUND LEFT UPPER EXTREMITY LIMITED  TECHNIQUE: Ultrasound examination of the upper extremity soft tissues was performed in the area of clinical concern.  COMPARISON:  Previous mammogram and ultrasound examinations.  FINDINGS: On physical examination, the patient has an approximately 5 cm rounded, soft, palpable mass in the anterior left axilla. No overlying skin redness or increased warmth.  Ultrasound of the left axilla demonstrated a 6.1 x 6.0 x 4.3 cm fluid collection containing heterogeneous areas of mildly increased echogenicity and internal membranes.  IMPRESSION: 6.1 x 6.0 x 4.3 cm left axillary complicated fluid collection. Differential considerations  include a postoperative hematoma converting to a seroma and infected postoperative hematoma or seroma. Percutaneous needle aspiration is recommended and is scheduled to follow.   Electronically Signed   By: Claudie Revering M.D.   On: 09/26/2014 18:09   US Aspiration  09/30/2014   ADDENDUM REPORT: 09/30/2014 09:33  ADDENDUM: Aspiration of Left axilla reveals abundant WBC present, both PMN and mononuclear, no organisms seen. Culture reveals few viridans Streptococcus. This information was reviewed by Dr Claudie Revering and reported to Carlene Coria of [facility]. Sharyn Lull will inform Dr. Autumn Messing of these results. The patient has a follow up appointment with Dr. Marlou Starks on October 04, 2014.  Results reported by Terie Purser RN on September 30, 2014.   Electronically Signed   By: Claudie Revering M.D.   On: 09/30/2014 09:33   09/30/2014   CLINICAL DATA:  6.2 cm complicated fluid collection in the left axilla and fever. Concern for an infected postoperative hematoma or seroma.  EXAM: ULTRASOUND GUIDED LEFT AXILLARY FLUID ASPIRATION  COMPARISON:  Previous exams.  PROCEDURE: Using sterile technique, 1% lidocaine, under direct ultrasound visualization, needle aspiration of the recently demonstrated 6.2 cm complicated fluid collection in the left axilla was performed. This yielded 60 cc of fluid. The initial 11 cc was clear and straw-colored. The next 11 cc was clear and blood tinged. The last 38 cc was cloudy and pink. The last 38 cc was sent to microbiology for routine culture and sensitivity.  IMPRESSION: Ultrasound-guided aspiration of a complicated left axillary postoperative fluid collection No apparent complications.  RECOMMENDATIONS: Clinical followup.  Electronically Signed: By: Claudie Revering M.D. On: 09/26/2014 18:13    ASSESSMENT: 49 y.o. BRCA negative [place] woman status post left breast biopsy 07/04/2014 for  a clinical T3 N0, stage IIA invasive lobular breast cancer, grade 2, estrogen receptor positive,  progesterone receptor 1% "positive", with an MIB-1 of less than 5% and no HER-2 amplification  (1) genetics testing 07/29/2014 through the [employer] gene panel offered by [facility] found no deleterious mutations in ATM, BARD1, BRCA1, BRCA2, BRIP1, CDH1, CHEK2, EPCAM, MLH1, MRE11A, MSH2, MSH6, MUTYH, NBN, NF1, PALB2, PMS2, PTEN, RAD50, RAD51C, RAD51D, SMARCA4, STK11, or TP53.   (2) status post bilateral mastectomies 08/10/2014 showing  (a) on the right, lobular carcinoma in situ  (b) on the left, a pT3 pN1, stage IIIA invasive lobular breast cancer, HER-2 repeated  (3) chemotherapy to start 09/27/2014,l consisting of standard doxorubicin and cyclophosphamide in dose dense fashion 4 followed by paclitaxel weekly 12  (4) radiation to follow chemotherapy as appropriate  (5) tamoxifen was started neoadjuvantly on 07/13/2014 given the likely delay in definitive surgery while genetics results are pending; this will be held during chemotherapy   PLAN: Sindhu's husband was not here for our review of her antiemetic schedule, so we reviewed that at length. He is concerned that chemo will cause her left axilla infection to flare up again. He wanted to know if there were a lab test to cover this. While a WBC might rise in a person with an active infection, Eulah may not be able to mount this response because of treatment. We will keep a close watch on this area, and she knows the alert [place] of pain, fever, or redness.   We also spent a fair amount of time discussing the rationale of her treatment. Her husband was confused as to why the tamoxifen would be on hold during her chemo. All of his questions were answered and he has gained a clearer understanding.   The labs were reviewed in detail and were stable. At this time there is no sign of infection to her left axilla. She will proceed with cycle 1 of doxorubicin and cyclophosphamide as planned today. She will return in 1 week for labs and a nadir visit.  She understands and agrees with this plan. She knows the goal of treatment in her case is cure. She has been encouraged to call with any issues that might arise before her next visit here.   Laurie Panda, NP   10/11/2014 12:35 PM

## 2014-10-12 ENCOUNTER — Telehealth: Payer: Self-pay | Admitting: *Deleted

## 2014-10-12 ENCOUNTER — Encounter: Payer: Self-pay | Admitting: Nurse Practitioner

## 2014-10-12 ENCOUNTER — Telehealth: Payer: Self-pay | Admitting: Nurse Practitioner

## 2014-10-12 ENCOUNTER — Ambulatory Visit: Payer: BLUE CROSS/BLUE SHIELD | Admitting: Physical Therapy

## 2014-10-12 ENCOUNTER — Other Ambulatory Visit: Payer: Self-pay | Admitting: Nurse Practitioner

## 2014-10-12 NOTE — Telephone Encounter (Signed)
Patient's husband called stating that patient took Decadron 8 mg at dinner and then started feeling "strange", heart racing and shakes. She also took a Compazine. She did not sleep all night and took an Ativan at 5 AM. After taking the Ativan her symptoms resolved. Patient states she is eating a little and drinking well. Denies any vomiting or diarrhea. Spoke with [employer].  Advised husband that this can be a side effect of the Decadron and to take only 1 Decadron tablet twice a day. May take Compazine every 6 hours as needed and to take the Ativan at bedtime. Also advised that she may start the zofran on Friday. He verbalized understanding.

## 2014-10-12 NOTE — Telephone Encounter (Signed)
Called and left a message with new appointments °

## 2014-10-12 NOTE — Telephone Encounter (Signed)
Patient with 1st A/C yesterday. Left message for patient to call and speak with any nurse in triage in reference to chemo f/u.

## 2014-10-14 ENCOUNTER — Other Ambulatory Visit (HOSPITAL_COMMUNITY): Payer: BLUE CROSS/BLUE SHIELD

## 2014-10-14 ENCOUNTER — Ambulatory Visit: Payer: BLUE CROSS/BLUE SHIELD | Admitting: Physical Therapy

## 2014-10-18 ENCOUNTER — Encounter: Payer: Self-pay | Admitting: *Deleted

## 2014-10-18 ENCOUNTER — Encounter: Payer: Self-pay | Admitting: Nurse Practitioner

## 2014-10-18 ENCOUNTER — Other Ambulatory Visit (HOSPITAL_BASED_OUTPATIENT_CLINIC_OR_DEPARTMENT_OTHER): Payer: BLUE CROSS/BLUE SHIELD

## 2014-10-18 ENCOUNTER — Ambulatory Visit (HOSPITAL_BASED_OUTPATIENT_CLINIC_OR_DEPARTMENT_OTHER): Payer: BLUE CROSS/BLUE SHIELD | Admitting: Nurse Practitioner

## 2014-10-18 VITALS — BP 120/58 | HR 107 | Temp 98.2°F | Resp 18 | Ht 68.0 in | Wt 212.9 lb

## 2014-10-18 DIAGNOSIS — D0501 Lobular carcinoma in situ of right breast: Secondary | ICD-10-CM | POA: Diagnosis not present

## 2014-10-18 DIAGNOSIS — K5900 Constipation, unspecified: Secondary | ICD-10-CM | POA: Insufficient documentation

## 2014-10-18 DIAGNOSIS — C50412 Malignant neoplasm of upper-outer quadrant of left female breast: Secondary | ICD-10-CM

## 2014-10-18 DIAGNOSIS — K649 Unspecified hemorrhoids: Secondary | ICD-10-CM | POA: Insufficient documentation

## 2014-10-18 DIAGNOSIS — Z170 Estrogen receptor positive status [ER+]: Secondary | ICD-10-CM

## 2014-10-18 DIAGNOSIS — K1231 Oral mucositis (ulcerative) due to antineoplastic therapy: Secondary | ICD-10-CM | POA: Insufficient documentation

## 2014-10-18 LAB — COMPREHENSIVE METABOLIC PANEL (CC13)
ALBUMIN: 3.7 g/dL (ref 3.5–5.0)
ALK PHOS: 143 U/L (ref 40–150)
ALT: 164 U/L — AB (ref 0–55)
AST: 57 U/L — AB (ref 5–34)
Anion Gap: 9 mEq/L (ref 3–11)
BILIRUBIN TOTAL: 0.32 mg/dL (ref 0.20–1.20)
BUN: 14.7 mg/dL (ref 7.0–26.0)
CO2: 24 mEq/L (ref 22–29)
CREATININE: 0.7 mg/dL (ref 0.6–1.1)
Calcium: 9.5 mg/dL (ref 8.4–10.4)
Chloride: 106 mEq/L (ref 98–109)
EGFR: >90 mL/min/{1.73_m2} (ref >90)
GLUCOSE: 96 mg/dL (ref 70–140)
POTASSIUM: 3.8 meq/L (ref 3.5–5.1)
SODIUM: 139 meq/L (ref 136–145)
TOTAL PROTEIN: 6.9 g/dL (ref 6.4–8.3)

## 2014-10-18 LAB — CBC WITH DIFFERENTIAL/PLATELET
BASO%: 0.4 % (ref 0.0–2.0)
Basophils Absolute: 0 10*3/uL (ref 0.0–0.1)
EOS%: 9.4 % — AB (ref 0.0–7.0)
Eosinophils Absolute: 0.3 10*3/uL (ref 0.0–0.5)
HCT: 39.3 % (ref 34.8–46.6)
HGB: 13 g/dL (ref 11.6–15.9)
LYMPH%: 50.1 % — ABNORMAL HIGH (ref 14.0–49.7)
MCH: 28.9 pg (ref 25.1–34.0)
MCHC: 33 g/dL (ref 31.5–36.0)
MCV: 87.6 fL (ref 79.5–101.0)
MONO#: 0.1 10*3/uL (ref 0.1–0.9)
MONO%: 3.8 % (ref 0.0–14.0)
NEUT%: 36.3 % — ABNORMAL LOW (ref 38.4–76.8)
NEUTROS ABS: 1 10*3/uL — AB (ref 1.5–6.5)
Platelets: 142 10*3/uL — ABNORMAL LOW (ref 145–400)
RBC: 4.49 10*6/uL (ref 3.70–5.45)
RDW: 12.8 % (ref 11.2–14.5)
WBC: 2.8 10*3/uL — AB (ref 3.9–10.3)
lymph#: 1.4 10*3/uL (ref 0.9–3.3)

## 2014-10-18 NOTE — Progress Notes (Signed)
[name]  [phone] Fax:([phone]) [phone]   ID: SHARAH FINNELL DOB: 03-03-1966  MR#: [id number]  XAJ#:[id number]  Patient Care Team: Delilah Shan, MD as PCP - General (Family Medicine) [name], MD as Consulting Physician (General Surgery) [name], MD as Consulting Physician (Oncology) [name], MD as Attending Physician (Radiation Oncology) [name], RN as Registered Nurse [name], RN as Registered Nurse [name], NP as Nurse Practitioner (Nurse Practitioner) PCP: [name], MD GYN: [name] MD OTHER MD:  CHIEF COMPLAINT: Estrogen receptor positive breast cancer  CURRENT TREATMENT: adjuvant chemotherapy  BREAST CANCER HISTORY: From the original intake note:  Lori [name] (who is the daughter of my former patient, Lori [name], herself now 10 years out from her T1 cN0 invasive ductal carcinoma, treated with anti-estrogens only) went [facility] April 20 03/23/2014. Breast density was category C. A possible mass in the left breast upper outer quadrant was felt to warrant further evaluation, and on 07/04/2014 the patient underwent left mammography with tomosynthesis and left breast ultrasonography. At this point the patient felt she was able to palpate a mass in the area in question. Tomosynthesis did reveal an irregular mass in the upper outer left breast measuring 2.3 cm, associated on physical exam with a large area of thickening. Ultrasound of the area in question confirmed an irregular mass at the 1:00 position 5 cm from the nipple measuring 2.2 cm. The left axilla was negative sonographically.  Biopsy of the mass in question the same day, 07/04/2014, showed (SAA [phone]) an invasive lobular breast cancer which was estrogen receptor 50% positive with strong staining intensity, progesterone receptor 1% positive with moderate staining intensity, with a proliferation  marker of 1%, and no HER-2 amplification, the signals ratio being 1.19 and the number per cell 1.55.  On 07/08/2014 the patient underwent bilateral breast MRI. This showed no abnormal adenopathy and no abnormality in the right breast. In the left breast however there was a 9.3 area of abnormal enhancement involving all quadrants. Within the upper outer quadrant there was a denser area which measured up to 5.1 cm.  The patient's subsequent history is as detailed below  INTERVAL HISTORY: Lori [name] returns today for follow-up of her breast cancer, accompanied by her husband. Today is day 8, cycle 1 of doxorubicin and cyclophosphamide, with neulasta given on day 2 for granulocyte support.    REVIEW OF SYSTEMS: Early on, Libra found herself very sensitive to the dexamethasone. She had palpitations and heart racing after each dose. This improved after dropping her dose to 1 tablet BID, but the heart racing was still present. She denies fevers, chills, nausea, or vomiting. She did have moderate constipation. She used miralax and ended up doubling up on her stool softeners. There are still firm, but she is able to go now. She developed internal hemorrhoids and is using OTC suppositories. Her taste and appetite has not changes, but her gums are red and irritated. She has no true mouth sores or thrush. She has a history of a gum condition, that she can not remember the name of currently. She had some bone pain from the neulasta and used ibuprofen, however not frequently enough to produce good results. A detailed review of systems is otherwise stable.     PAST MEDICAL HISTORY: Past Medical History  Diagnosis Date  . Asthma     last trimester of pregnancy 3 yrs ago-  never had  problems since then, no meds  . Breast cancer   . Anxiety     PAST SURGICAL HISTORY: Past Surgical History  Procedure Laterality Date  . Cesarean section      x 2  . Knee arthroscopy Left 07/27/2013    Procedure: LEFT ARTHROSCOPY  KNEE WITH MEDIAL MENISCAL DEBRIDEMENT;  Surgeon: Gearlean Alf, MD;  Location: [facility];  Service: Orthopedics;  Laterality: Left;  Marland Kitchen Mastectomy w/ sentinel node biopsy Left 08/10/2014  . Mastectomy w/ sentinel node biopsy Bilateral 08/10/2014    Procedure: MASTECTOMY WITH SENTINEL LYMPH NODE BIOPSY;  Surgeon: [name], MD;  Location: [place];  Service: General;  Laterality: Bilateral;  . Portacath placement Right 09/07/2014    Procedure: INSERTION PORT-A-CATH;  Surgeon: [name], MD;  Location: [place];  Service: General;  Laterality: Right;    FAMILY HISTORY Family History  Problem Relation Age of Onset  . Breast cancer Mother 110  . Lymphoma Maternal Aunt 71  . Uterine cancer Paternal Aunt 78  . Uterine cancer Paternal Grandmother 23  . Ovarian cancer Other 85    mat great aunt through [facility]  . Ovarian cancer Maternal Grandmother 39  The patient's father died with congestive heart failure at the age of 47. The patient's mother is living, age 47. She was diagnosed with breast cancer at age 47. The patient's paternal grandmother was diagnosed with uterine cancer at age 48 and her daughter, the patient's paternal aunt, also with uterine cancer at age 61. On the mother's side one maternal aunt was diagnosed with lymphoma at 47 and the other with throat cancer. A maternal great aunt at age 58 was diagnosed with ovarian cancer.   GYNECOLOGIC HISTORY:  No LMP recorded.  menarche age 83, first live birth age 9, which the patient is aware double as the risk of breast cancer. She is GX P2. She still having regular periods. 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 Her husband Lori [name] (goes by "[name]") works in Engineer, technical sales, although currently he is looking for work. Their children are Lori [name] 12 and Lori [name] 3. The patient is not a church attender    ADVANCED DIRECTIVES: Not in place   HEALTH  MAINTENANCE: Social History  Substance Use Topics  . Smoking status: Former Smoker -- 1.00 packs/day for 8 years    Types: Cigarettes    Quit date: 07/20/2000  . Smokeless tobacco: Never Used  . Alcohol Use: Yes     Comment: socially     Colonoscopy:  PAP:  Bone density:  Lipid panel:  No Known Allergies  Current Outpatient Prescriptions  Medication Sig Dispense Refill  . dexamethasone (DECADRON) 4 MG tablet Take 2 tablets by mouth once a day on the day after chemotherapy and then take 2 tablets two times a day for 2 days. Take with food. 30 tablet 1  . Docusate Sodium (DULCOLAX STOOL SOFTENER PO) Take 1 tablet by mouth daily.    Marland Kitchen ibuprofen (ADVIL,MOTRIN) 200 MG tablet Take 600 mg by mouth every 6 (six) hours as needed.     . lidocaine-prilocaine (EMLA) cream Apply to affected area once 30 g 3  . loratadine (CLARITIN) 10 MG tablet Take 10 mg by mouth daily.    Marland Kitchen LORazepam (ATIVAN) 0.5 MG tablet Take 1 tablet (0.5 mg total) by mouth at bedtime. 30 tablet 0  . ondansetron (ZOFRAN) 8 MG tablet Take  1 tablet (8 mg total) by mouth 2 (two) times daily as needed. Start on the [name] day after chemotherapy. 30 tablet 1  . Probiotic Product (ALIGN PO) Take 1 capsule by mouth daily.    . prochlorperazine (COMPAZINE) 10 MG tablet Take 1 tablet (10 mg total) by mouth every 6 (six) hours as needed (Nausea or vomiting). 30 tablet 1  . HYDROcodone-acetaminophen (NORCO/VICODIN) 5-325 MG per tablet Take 1-2 tablets by mouth every 4 (four) hours as needed for moderate pain or severe pain. (Patient not taking: Reported on 09/16/2014) 50 tablet 0  . tamoxifen (NOLVADEX) 20 MG tablet Take 20 mg by mouth daily.    Marland Kitchen zolpidem (AMBIEN) 5 MG tablet Take 1 tablet (5 mg total) by mouth at bedtime as needed for sleep. (Patient not taking: Reported on 09/16/2014) 10 tablet 0   No current facility-administered medications for this visit.    OBJECTIVE: young White woman who appears stated age 49 Vitals:    10/18/14 1303  BP: 120/58  Pulse: 107  Temp: 98.2 F (36.8 C)  Resp: 18     Body mass index is 32.38 kg/(m^2).    ECOG FS:1 - Symptomatic but completely ambulatory   Sclerae unicteric, pupils round and equal Oropharynx clear and moist-- no thrush or other lesions No cervical or supraclavicular adenopathy Lungs no rales or rhonchi Heart regular rate and rhythm Abd soft, nontender, positive bowel sounds MSK no focal spinal tenderness, no upper extremity lymphedema Neuro: nonfocal, well oriented, appropriate affect Breasts: deferred  LAB RESULTS:  CMP     Component Value Date/Time   NA 139 10/18/2014 1251   NA 139 08/03/2014 1022   K 3.8 10/18/2014 1251   K 4.1 08/03/2014 1022   CL 106 08/03/2014 1022   CO2 24 10/18/2014 1251   CO2 25 08/03/2014 1022   GLUCOSE 96 10/18/2014 1251   GLUCOSE 89 08/03/2014 1022   BUN 14.7 10/18/2014 1251   BUN 10 08/03/2014 1022   CREATININE 0.7 10/18/2014 1251   CREATININE 0.68 08/03/2014 1022   CALCIUM 9.5 10/18/2014 1251   CALCIUM 9.3 08/03/2014 1022   PROT 6.9 10/18/2014 1251   ALBUMIN 3.7 10/18/2014 1251   AST 57* 10/18/2014 1251   ALT 164* 10/18/2014 1251   ALKPHOS 143 10/18/2014 1251   BILITOT 0.32 10/18/2014 1251   GFRNONAA >60 08/03/2014 1022   GFRAA >60 08/03/2014 1022    INo results found for: SPEP, UPEP  Lab Results  Component Value Date   WBC 2.8* 10/18/2014   NEUTROABS 1.0* 10/18/2014   HGB 13.0 10/18/2014   HCT 39.3 10/18/2014   MCV 87.6 10/18/2014   PLT 142* 10/18/2014      Chemistry      Component Value Date/Time   NA 139 10/18/2014 1251   NA 139 08/03/2014 1022   K 3.8 10/18/2014 1251   K 4.1 08/03/2014 1022   CL 106 08/03/2014 1022   CO2 24 10/18/2014 1251   CO2 25 08/03/2014 1022   BUN 14.7 10/18/2014 1251   BUN 10 08/03/2014 1022   CREATININE 0.7 10/18/2014 1251   CREATININE 0.68 08/03/2014 1022      Component Value Date/Time   CALCIUM 9.5 10/18/2014 1251   CALCIUM 9.3 08/03/2014 1022    ALKPHOS 143 10/18/2014 1251   AST 57* 10/18/2014 1251   ALT 164* 10/18/2014 1251   BILITOT 0.32 10/18/2014 1251       No results found for: LABCA2  No components found for: [id number]  No  results for input(s): INR in the last 168 hours.  Urinalysis No results found for: COLORURINE, APPEARANCEUR, LABSPEC, PHURINE, GLUCOSEU, HGBUR, BILIRUBINUR, KETONESUR, PROTEINUR, UROBILINOGEN, NITRITE, LEUKOCYTESUR  STUDIES: [place]  09/26/2014   CLINICAL DATA:  Enlarging, palpable mass in the anterior left axilla with fever. The patient was started on Augmentin 3 days ago. Status post left mastectomy for breast cancer on 08/10/2014.  EXAM: ULTRASOUND LEFT UPPER EXTREMITY LIMITED  TECHNIQUE: Ultrasound examination of the upper extremity soft tissues was performed in the area of clinical concern.  COMPARISON:  Previous mammogram and ultrasound examinations.  FINDINGS: On physical examination, the patient has an approximately 5 cm rounded, soft, palpable mass in the anterior left axilla. No overlying skin redness or increased warmth.  Ultrasound of the left axilla demonstrated a 6.1 x 6.0 x 4.3 cm fluid collection containing heterogeneous areas of mildly increased echogenicity and internal membranes.  IMPRESSION: 6.1 x 6.0 x 4.3 cm left axillary complicated fluid collection. Differential considerations include a postoperative hematoma converting to a seroma and infected postoperative hematoma or seroma. Percutaneous needle aspiration is recommended and is scheduled to follow.   Electronically Signed   By: Claudie Revering M.D.   On: 09/26/2014 18:09   US Aspiration  09/30/2014   ADDENDUM REPORT: 09/30/2014 09:33  ADDENDUM: Aspiration of Left axilla reveals abundant WBC present, both PMN and mononuclear, no organisms seen. Culture reveals few viridans Streptococcus. This information was reviewed by Dr Claudie Revering and reported to Carlene Coria of [facility]. Sharyn Lull will inform Dr. Autumn Messing of these results. The patient has a follow up appointment with Dr. Marlou Starks on October 04, 2014.  Results reported by Terie Purser RN on September 30, 2014.   Electronically Signed   By: Claudie Revering M.D.   On: 09/30/2014 09:33   09/30/2014   CLINICAL DATA:  6.2 cm complicated fluid collection in the left axilla and fever. Concern for an infected postoperative hematoma or seroma.  EXAM: ULTRASOUND GUIDED LEFT AXILLARY FLUID ASPIRATION  COMPARISON:  Previous exams.  PROCEDURE: Using sterile technique, 1% lidocaine, under direct ultrasound visualization, needle aspiration of the recently demonstrated 6.2 cm complicated fluid collection in the left axilla was performed. This yielded 60 cc of fluid. The initial 11 cc was clear and straw-colored. The next 11 cc was clear and blood tinged. The last 38 cc was cloudy and pink. The last 38 cc was sent to microbiology for routine culture and sensitivity.  IMPRESSION: Ultrasound-guided aspiration of a complicated left axillary postoperative fluid collection No apparent complications.  RECOMMENDATIONS: Clinical followup.  Electronically Signed: By: Claudie Revering M.D. On: 09/26/2014 18:13    ASSESSMENT: 49 y.o. BRCA negative [place] woman status post left breast biopsy 07/04/2014 for a clinical T3 N0, stage IIA invasive lobular breast cancer, grade 2, estrogen receptor positive, progesterone receptor 1% "positive", with an MIB-1 of less than 5% and no HER-2 amplification  (1) genetics testing 07/29/2014 through the [employer] gene panel offered by [employer] found no deleterious mutations in ATM, BARD1, BRCA1, BRCA2, BRIP1, CDH1, CHEK2, EPCAM, MLH1, MRE11A, MSH2, MSH6, MUTYH, NBN, NF1, PALB2, PMS2, PTEN, RAD50, RAD51C, RAD51D, SMARCA4, STK11, or TP53.   (2) status post bilateral mastectomies 08/10/2014 showing  (a) on the right, lobular carcinoma in situ  (b) on the left, a pT3 pN1, stage IIIA invasive lobular breast cancer, HER-2 repeated  (3) chemotherapy to start  09/27/2014,l consisting of standard doxorubicin and cyclophosphamide in dose dense fashion 4 followed  by paclitaxel weekly 12  (4) radiation to follow chemotherapy as appropriate  (5) tamoxifen was started neoadjuvantly on 07/13/2014 given the likely delay in definitive surgery while genetics results are pending; this will be held during chemotherapy   PLAN: Overall Favour performed moderately well with her first cycle of chemo. I think she will perform better in the future if she removes the dexamethasone from her antiemetic schedule. Even taking half the dose during this cycle caused uncomfortable heart racing. She will try and rely on the zofran and compazine alone, but nausea was not a big issue for her. Constipation did trouble her so she might have to start her stool softeners and miralax a day or two before treatment and then for several days after. Her hemorrhoids are clearing up with OTC remedies. I am sending a prescription for magic mouth wash to see if this will soothe her gums.  She will return in 1 week for cycle 2 of treatment. She understands and agrees with this plan. She knows the goal of treatment in her case is cure. She has been encouraged to call with any issues that might arise before her next visit here.  Laurie Panda, NP   10/18/2014 4:22 PM

## 2014-10-25 ENCOUNTER — Encounter: Payer: Self-pay | Admitting: Nurse Practitioner

## 2014-10-25 ENCOUNTER — Ambulatory Visit (HOSPITAL_BASED_OUTPATIENT_CLINIC_OR_DEPARTMENT_OTHER): Payer: BLUE CROSS/BLUE SHIELD

## 2014-10-25 ENCOUNTER — Telehealth: Payer: Self-pay | Admitting: Nurse Practitioner

## 2014-10-25 ENCOUNTER — Encounter: Payer: Self-pay | Admitting: *Deleted

## 2014-10-25 ENCOUNTER — Ambulatory Visit (HOSPITAL_BASED_OUTPATIENT_CLINIC_OR_DEPARTMENT_OTHER): Payer: BLUE CROSS/BLUE SHIELD | Admitting: Nurse Practitioner

## 2014-10-25 ENCOUNTER — Other Ambulatory Visit (HOSPITAL_BASED_OUTPATIENT_CLINIC_OR_DEPARTMENT_OTHER): Payer: BLUE CROSS/BLUE SHIELD

## 2014-10-25 ENCOUNTER — Other Ambulatory Visit: Payer: Self-pay | Admitting: Oncology

## 2014-10-25 ENCOUNTER — Other Ambulatory Visit: Payer: BLUE CROSS/BLUE SHIELD

## 2014-10-25 VITALS — BP 106/51 | HR 79 | Temp 98.9°F | Resp 18 | Wt 213.2 lb

## 2014-10-25 DIAGNOSIS — C50412 Malignant neoplasm of upper-outer quadrant of left female breast: Secondary | ICD-10-CM | POA: Diagnosis not present

## 2014-10-25 DIAGNOSIS — Z5189 Encounter for other specified aftercare: Secondary | ICD-10-CM

## 2014-10-25 DIAGNOSIS — Z170 Estrogen receptor positive status [ER+]: Secondary | ICD-10-CM

## 2014-10-25 DIAGNOSIS — Z5111 Encounter for antineoplastic chemotherapy: Secondary | ICD-10-CM | POA: Diagnosis not present

## 2014-10-25 DIAGNOSIS — Z95828 Presence of other vascular implants and grafts: Secondary | ICD-10-CM

## 2014-10-25 DIAGNOSIS — Z452 Encounter for adjustment and management of vascular access device: Secondary | ICD-10-CM

## 2014-10-25 LAB — COMPREHENSIVE METABOLIC PANEL (CC13)
ALT: 58 U/L — AB (ref 0–55)
ANION GAP: 9 meq/L (ref 3–11)
AST: 26 U/L (ref 5–34)
Albumin: 3.6 g/dL (ref 3.5–5.0)
Alkaline Phosphatase: 108 U/L (ref 40–150)
BUN: 15.1 mg/dL (ref 7.0–26.0)
CHLORIDE: 109 meq/L (ref 98–109)
CO2: 25 mEq/L (ref 22–29)
Calcium: 9.4 mg/dL (ref 8.4–10.4)
Creatinine: 0.7 mg/dL (ref 0.6–1.1)
EGFR: >90 mL/min/{1.73_m2} (ref >90)
Glucose: 82 mg/dl (ref 70–140)
Potassium: 4.7 mEq/L (ref 3.5–5.1)
SODIUM: 143 meq/L (ref 136–145)
Total Bilirubin: <0.2 mg/dL (ref 0.20–1.20)
Total Protein: 6.5 g/dL (ref 6.4–8.3)

## 2014-10-25 LAB — CBC WITH DIFFERENTIAL/PLATELET
BASO%: 1 % (ref 0.0–2.0)
Basophils Absolute: 0.1 10*3/uL (ref 0.0–0.1)
EOS%: 1.2 % (ref 0.0–7.0)
Eosinophils Absolute: 0.1 10*3/uL (ref 0.0–0.5)
HCT: 37.1 % (ref 34.8–46.6)
HGB: 12.4 g/dL (ref 11.6–15.9)
LYMPH%: 32.1 % (ref 14.0–49.7)
MCH: 29.5 pg (ref 25.1–34.0)
MCHC: 33.4 g/dL (ref 31.5–36.0)
MCV: 88.3 fL (ref 79.5–101.0)
MONO#: 0.7 10*3/uL (ref 0.1–0.9)
MONO%: 9 % (ref 0.0–14.0)
NEUT#: 4.1 10*3/uL (ref 1.5–6.5)
NEUT%: 56.7 % (ref 38.4–76.8)
PLATELETS: 189 10*3/uL (ref 145–400)
RBC: 4.2 10*6/uL (ref 3.70–5.45)
RDW: 13.1 % (ref 11.2–14.5)
WBC: 7.2 10*3/uL (ref 3.9–10.3)
lymph#: 2.3 10*3/uL (ref 0.9–3.3)

## 2014-10-25 MED ORDER — SODIUM CHLORIDE 0.9 % IJ SOLN
10.0000 mL | INTRAMUSCULAR | Status: DC | PRN
  Administered 2014-10-25: 10 mL
  Filled 2014-10-25: qty 10

## 2014-10-25 MED ORDER — PALONOSETRON HCL INJECTION 0.25 MG/5ML
0.2500 mg | Freq: Once | INTRAVENOUS | Status: AC
  Administered 2014-10-25: 0.25 mg via INTRAVENOUS

## 2014-10-25 MED ORDER — PALONOSETRON HCL INJECTION 0.25 MG/5ML
INTRAVENOUS | Status: AC
  Filled 2014-10-25: qty 5

## 2014-10-25 MED ORDER — ALTEPLASE 2 MG IJ SOLR
2.0000 mg | Freq: Once | INTRAMUSCULAR | Status: AC | PRN
  Administered 2014-10-25: 2 mg
  Filled 2014-10-25: qty 2

## 2014-10-25 MED ORDER — HEPARIN SOD (PORK) LOCK FLUSH 100 UNIT/ML IV SOLN
500.0000 [IU] | Freq: Once | INTRAVENOUS | Status: AC | PRN
  Administered 2014-10-25: 500 [IU]
  Filled 2014-10-25: qty 5

## 2014-10-25 MED ORDER — DOXORUBICIN HCL CHEMO IV INJECTION 2 MG/ML
60.0000 mg/m2 | Freq: Once | INTRAVENOUS | Status: AC
  Administered 2014-10-25: 128 mg via INTRAVENOUS
  Filled 2014-10-25: qty 64

## 2014-10-25 MED ORDER — SODIUM CHLORIDE 0.9 % IV SOLN
600.0000 mg/m2 | Freq: Once | INTRAVENOUS | Status: AC
  Administered 2014-10-25: 1280 mg via INTRAVENOUS
  Filled 2014-10-25: qty 64

## 2014-10-25 MED ORDER — PEGFILGRASTIM 6 MG/0.6ML ~~LOC~~ PSKT
6.0000 mg | PREFILLED_SYRINGE | Freq: Once | SUBCUTANEOUS | Status: AC
  Administered 2014-10-25: 6 mg via SUBCUTANEOUS
  Filled 2014-10-25: qty 0.6

## 2014-10-25 MED ORDER — SODIUM CHLORIDE 0.9 % IJ SOLN
10.0000 mL | INTRAMUSCULAR | Status: DC | PRN
  Administered 2014-10-25: 10 mL via INTRAVENOUS
  Filled 2014-10-25: qty 10

## 2014-10-25 MED ORDER — SODIUM CHLORIDE 0.9 % IV SOLN
Freq: Once | INTRAVENOUS | Status: AC
  Administered 2014-10-25: 13:00:00 via INTRAVENOUS
  Filled 2014-10-25: qty 5

## 2014-10-25 MED ORDER — SODIUM CHLORIDE 0.9 % IV SOLN
Freq: Once | INTRAVENOUS | Status: AC
  Administered 2014-10-25: 13:00:00 via INTRAVENOUS

## 2014-10-25 NOTE — Telephone Encounter (Signed)
Appointments made and avs printed °

## 2014-10-25 NOTE — Patient Instructions (Signed)

## 2014-10-25 NOTE — Progress Notes (Signed)
Port accessed for lab draw/infusion today. Port accessed with no difficulties, flushes well. No blood return noted at this time. Worked with port for approx 5-10 min with no blood return, Actuary, LPN notified. Randolm Idol, RN placed TPA in pac, pt to have blood draw peripherally. Pt to have TPA checked at 1110. [place] notified.

## 2014-10-25 NOTE — Patient Instructions (Signed)
[facility] Discharge Instructions for Patients Receiving Chemotherapy  Today you received the following chemotherapy agents Adriamycin and Cytoxan  To help prevent nausea and vomiting after your treatment, we encourage you to take your nausea medication Compazine as directed   If you develop nausea and vomiting that is not controlled by your nausea medication, call the clinic.   BELOW ARE SYMPTOMS THAT SHOULD BE REPORTED IMMEDIATELY:  *FEVER GREATER THAN 100.5 F  *CHILLS WITH OR WITHOUT FEVER  NAUSEA AND VOMITING THAT IS NOT CONTROLLED WITH YOUR NAUSEA MEDICATION  *UNUSUAL SHORTNESS OF BREATH  *UNUSUAL BRUISING OR BLEEDING  TENDERNESS IN MOUTH AND THROAT WITH OR WITHOUT PRESENCE OF ULCERS  *URINARY PROBLEMS  *BOWEL PROBLEMS  UNUSUAL RASH Items with * indicate a potential emergency and should be followed up as soon as possible.  Feel free to call the clinic you have any questions or concerns. The clinic phone number is [phone].  Please show the [place] at check-in to the Emergency Department and triage nurse.

## 2014-10-25 NOTE — Progress Notes (Signed)
[place]  [phone] [phone]) [phone]   ID: Lori [name] DOB: [id number]  MR#: [id number]  HWT#:[id number]  Patient Care Team: Delilah Shan, MD as PCP - General (Family Medicine) [name], MD as Consulting Physician (General Surgery) [name], MD as Consulting Physician (Oncology) [name], MD as Attending Physician (Radiation Oncology) [name], RN as Registered Nurse [name], RN as Registered Nurse [name], NP as Nurse Practitioner (Nurse Practitioner) PCP: [name], MD GYN: [name] MD OTHER MD:  CHIEF COMPLAINT: Estrogen receptor positive breast cancer  CURRENT TREATMENT: adjuvant chemotherapy  BREAST CANCER HISTORY: From the original intake note:  Lori [name] (who is the daughter of my former patient, Lori [name], herself now 10 years out from her T1 cN0 invasive ductal carcinoma, treated with anti-estrogens only) [facility] April 20 03/23/2014. Breast density was category C. A possible mass in the left breast upper outer quadrant was felt to warrant further evaluation, and on 07/04/2014 the patient underwent left mammography with tomosynthesis and left breast ultrasonography. At this point the patient felt she was able to palpate a mass in the area in question. Tomosynthesis did reveal an irregular mass in the upper outer left breast measuring 2.3 cm, associated on physical exam with a large area of thickening. Ultrasound of the area in question confirmed an irregular mass at the 1:00 position 5 cm from the nipple measuring 2.2 cm. The left axilla was negative sonographically.  Biopsy of the mass in question the same day, 07/04/2014, showed ([id number]) an invasive lobular breast cancer which was estrogen receptor 50% positive with strong staining intensity, progesterone receptor 1% positive with moderate staining intensity, with a proliferation  marker of 1%, and no HER-2 amplification, the signals ratio being 1.19 and the number per cell 1.55.  On 07/08/2014 the patient underwent bilateral breast MRI. This showed no abnormal adenopathy and no abnormality in the right breast. In the left breast however there was a 9.3 area of abnormal enhancement involving all quadrants. Within the upper outer quadrant there was a denser area which measured up to 5.1 cm.  The patient's subsequent history is as detailed below  INTERVAL HISTORY: Lori [name] returns today for follow-up of her breast cancer, accompanied by her husband. Today is day 1 cycle 2 of doxorubicin and cyclophosphamide, with neulasta given on day 2 for granulocyte support. Last cycle she needed cathflo to get her port to give good blood return, and again today she is having difficulty achieving this. The port flushes well and she denies pain, but there is not blood return after being accessed in the flush room. Another instillation of cathflo was given, prior to our visit, and her port will be checked again in 1 hr.   REVIEW OF SYSTEMS: Mayvis denies fevers, chills, nausea or vomiting. She is taking stool softeners and miralax to stay regular. Her hemorrhoids are healing, but there is still some irritation/itchiness to her anus.The magic mouthwash has resolved her gum sensitivity. She has no mouth sores, rashes, or neuropathy symptoms. She is already reporting signs of "chemo brain" and has withdrawn from shows with plots that are too complicated to watched. She still has her hair. Her energy level is decent. A detailed review of systems is otherwise stable.  PAST MEDICAL HISTORY: Past Medical History  Diagnosis Date  . Asthma     last trimester of pregnancy 3 yrs ago-  never had problems  since then, no meds  . Breast cancer   . Anxiety     PAST SURGICAL HISTORY: Past Surgical History  Procedure Laterality Date  . Cesarean section      x 2  . Knee arthroscopy Left 07/27/2013     Procedure: LEFT ARTHROSCOPY KNEE WITH MEDIAL MENISCAL DEBRIDEMENT;  Surgeon: Gearlean Alf, MD;  Location: [facility];  Service: Orthopedics;  Laterality: Left;  Marland Kitchen Mastectomy w/ sentinel node biopsy Left 08/10/2014  . Mastectomy w/ sentinel node biopsy Bilateral 08/10/2014    Procedure: MASTECTOMY WITH SENTINEL LYMPH NODE BIOPSY;  Surgeon: [name], MD;  Location: [facility];  Service: General;  Laterality: Bilateral;  . Portacath placement Right 09/07/2014    Procedure: INSERTION PORT-A-CATH;  Surgeon: [name], MD;  Location: [place];  Service: General;  Laterality: Right;    FAMILY HISTORY Family History  Problem Relation Age of Onset  . Breast cancer Mother 75  . Lymphoma Maternal Aunt 71  . Uterine cancer Paternal Aunt 54  . Uterine cancer Paternal Grandmother 39  . Ovarian cancer Other 13    mat great aunt through [facility]  . Ovarian cancer Maternal Grandmother 26  The patient's father died with congestive heart failure at the age of 16. The patient's mother is living, age 57. She was diagnosed with breast cancer at age 26. The patient's paternal grandmother was diagnosed with uterine cancer at age 67 and her daughter, the patient's paternal aunt, also with uterine cancer at age 63. On the mother's side one maternal aunt was diagnosed with lymphoma and the other with throat cancer. A maternal great aunt at age 70 was diagnosed with ovarian cancer.   GYNECOLOGIC HISTORY:  No LMP recorded.  menarche age 41, first live birth age 81, which the patient is aware double as the risk of breast cancer. She is GX P2. She still having regular periods. 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 Her husband Lori [name] the third (goes by "Lori [name]") works in Engineer, technical sales, although currently he is looking for work. Their children are Apolonio [name] 12 and Lori [name] 3. The patient is not a church attender    ADVANCED DIRECTIVES: Not in  place   HEALTH MAINTENANCE: Social History  Substance Use Topics  . Smoking status: Former Smoker -- 1.00 packs/day for 8 years    Types: Cigarettes    Quit date: 07/20/2000  . Smokeless tobacco: Never Used  . Alcohol Use: Yes     Comment: socially     Colonoscopy:  PAP:  Bone density:  Lipid panel:  No Known Allergies  Current Outpatient Prescriptions  Medication Sig Dispense Refill  . Docusate Sodium (DULCOLAX STOOL SOFTENER PO) Take 1 tablet by mouth daily.    Marland Kitchen ibuprofen (ADVIL,MOTRIN) 200 MG tablet Take 600 mg by mouth every 6 (six) hours as needed.     . lidocaine-prilocaine (EMLA) cream Apply to affected area once 30 g 3  . loratadine (CLARITIN) 10 MG tablet Take 10 mg by mouth daily.    Marland Kitchen LORazepam (ATIVAN) 0.5 MG tablet Take 1 tablet (0.5 mg total) by mouth at bedtime. 30 tablet 0  . ondansetron (ZOFRAN) 8 MG tablet Take 1 tablet (8 mg total) by mouth 2 (two) times daily as needed. Start on the third day after chemotherapy. 30 tablet 1  . Probiotic Product (ALIGN PO) Take 1 capsule by mouth daily.    Marland Kitchen  prochlorperazine (COMPAZINE) 10 MG tablet Take 1 tablet (10 mg total) by mouth every 6 (six) hours as needed (Nausea or vomiting). 30 tablet 1  . dexamethasone (DECADRON) 4 MG tablet Take 2 tablets by mouth once a day on the day after chemotherapy and then take 2 tablets two times a day for 2 days. Take with food. (Patient not taking: Reported on 10/25/2014) 30 tablet 1  . HYDROcodone-acetaminophen (NORCO/VICODIN) 5-325 MG per tablet Take 1-2 tablets by mouth every 4 (four) hours as needed for moderate pain or severe pain. (Patient not taking: Reported on 09/16/2014) 50 tablet 0  . tamoxifen (NOLVADEX) 20 MG tablet Take 20 mg by mouth daily.    Marland Kitchen zolpidem (AMBIEN) 5 MG tablet Take 1 tablet (5 mg total) by mouth at bedtime as needed for sleep. (Patient not taking: Reported on 09/16/2014) 10 tablet 0   No current facility-administered medications for this visit.    OBJECTIVE:  young White woman who appears stated age 49 Vitals:   10/25/14 1116  BP: 106/51  Pulse: 79  Temp: 98.9 F (37.2 C)  Resp: 18     Body mass index is 32.42 kg/(m^2).    ECOG FS:1 - Symptomatic but completely ambulatory   Skin: warm, dry  HEENT: sclerae anicteric, conjunctivae pink, oropharynx clear. No thrush or mucositis.  Lymph Nodes: No cervical or supraclavicular lymphadenopathy  Lungs: clear to auscultation bilaterally, no rales, wheezes, or rhonci  Heart: regular rate and rhythm  Abdomen: round, soft, non tender, positive bowel sounds  Musculoskeletal: No focal spinal tenderness, no peripheral edema  Neuro: non focal, well oriented, positive affect  Breasts; deferred  LAB RESULTS:  CMP     Component Value Date/Time   NA 143 10/25/2014 1019   NA 139 08/03/2014 1022   K 4.7 10/25/2014 1019   K 4.1 08/03/2014 1022   CL 106 08/03/2014 1022   CO2 25 10/25/2014 1019   CO2 25 08/03/2014 1022   GLUCOSE 82 10/25/2014 1019   GLUCOSE 89 08/03/2014 1022   BUN 15.1 10/25/2014 1019   BUN 10 08/03/2014 1022   CREATININE 0.7 10/25/2014 1019   CREATININE 0.68 08/03/2014 1022   CALCIUM 9.4 10/25/2014 1019   CALCIUM 9.3 08/03/2014 1022   PROT 6.5 10/25/2014 1019   ALBUMIN 3.6 10/25/2014 1019   AST 26 10/25/2014 1019   ALT 58* 10/25/2014 1019   ALKPHOS 108 10/25/2014 1019   BILITOT <0.20 10/25/2014 1019   GFRNONAA >60 08/03/2014 1022   GFRAA >60 08/03/2014 1022    INo results found for: SPEP, UPEP  Lab Results  Component Value Date   WBC 7.2 10/25/2014   NEUTROABS 4.1 10/25/2014   HGB 12.4 10/25/2014   HCT 37.1 10/25/2014   MCV 88.3 10/25/2014   PLT 189 10/25/2014      Chemistry      Component Value Date/Time   NA 143 10/25/2014 1019   NA 139 08/03/2014 1022   K 4.7 10/25/2014 1019   K 4.1 08/03/2014 1022   CL 106 08/03/2014 1022   CO2 25 10/25/2014 1019   CO2 25 08/03/2014 1022   BUN 15.1 10/25/2014 1019   BUN 10 08/03/2014 1022   CREATININE 0.7  10/25/2014 1019   CREATININE 0.68 08/03/2014 1022      Component Value Date/Time   CALCIUM 9.4 10/25/2014 1019   CALCIUM 9.3 08/03/2014 1022   ALKPHOS 108 10/25/2014 1019   AST 26 10/25/2014 1019   ALT 58* 10/25/2014 1019   BILITOT <0.20  10/25/2014 1019       No results found for: LABCA2  No components found for: WCHEN277  No results for input(s): INR in the last 168 hours.  Urinalysis No results found for: COLORURINE, APPEARANCEUR, LABSPEC, PHURINE, GLUCOSEU, HGBUR, BILIRUBINUR, KETONESUR, PROTEINUR, UROBILINOGEN, NITRITE, LEUKOCYTESUR  STUDIES: [place]  09/26/2014   CLINICAL DATA:  Enlarging, palpable mass in the anterior left axilla with fever. The patient was started on Augmentin 3 days ago. Status post left mastectomy for breast cancer on 08/10/2014.  EXAM: ULTRASOUND LEFT UPPER EXTREMITY LIMITED  TECHNIQUE: Ultrasound examination of the upper extremity soft tissues was performed in the area of clinical concern.  COMPARISON:  Previous mammogram and ultrasound examinations.  FINDINGS: On physical examination, the patient has an approximately 5 cm rounded, soft, palpable mass in the anterior left axilla. No overlying skin redness or increased warmth.  Ultrasound of the left axilla demonstrated a 6.1 x 6.0 x 4.3 cm fluid collection containing heterogeneous areas of mildly increased echogenicity and internal membranes.  IMPRESSION: 6.1 x 6.0 x 4.3 cm left axillary complicated fluid collection. Differential considerations include a postoperative hematoma converting to a seroma and infected postoperative hematoma or seroma. Percutaneous needle aspiration is recommended and is scheduled to follow.   Electronically Signed   By: Claudie Revering M.D.   On: 09/26/2014 18:09   US Aspiration  09/30/2014   ADDENDUM REPORT: 09/30/2014 09:33  ADDENDUM: Aspiration of Left axilla reveals abundant WBC present, both PMN and mononuclear, no organisms seen. Culture reveals few viridans  Streptococcus. This information was reviewed by Dr Claudie Revering and reported to Carlene Coria of [facility]. Sharyn Lull will inform Dr. Autumn Messing of these results. The patient has a follow up appointment with Dr. Marlou Starks on October 04, 2014.  Results reported by Terie Purser RN on September 30, 2014.   Electronically Signed   By: Claudie Revering M.D.   On: 09/30/2014 09:33   09/30/2014   CLINICAL DATA:  6.2 cm complicated fluid collection in the left axilla and fever. Concern for an infected postoperative hematoma or seroma.  EXAM: ULTRASOUND GUIDED LEFT AXILLARY FLUID ASPIRATION  COMPARISON:  Previous exams.  PROCEDURE: Using sterile technique, 1% lidocaine, under direct ultrasound visualization, needle aspiration of the recently demonstrated 6.2 cm complicated fluid collection in the left axilla was performed. This yielded 60 cc of fluid. The initial 11 cc was clear and straw-colored. The next 11 cc was clear and blood tinged. The last 38 cc was cloudy and pink. The last 38 cc was sent to microbiology for routine culture and sensitivity.  IMPRESSION: Ultrasound-guided aspiration of a complicated left axillary postoperative fluid collection No apparent complications.  RECOMMENDATIONS: Clinical followup.  Electronically Signed: By: Claudie Revering M.D. On: 09/26/2014 18:13    ASSESSMENT: 49 y.o. BRCA negative [place] woman status post left breast biopsy 07/04/2014 for a clinical T3 N0, stage IIA invasive lobular breast cancer, grade 2, estrogen receptor positive, progesterone receptor 1% "positive", with an MIB-1 of less than 5% and no HER-2 amplification  (1) genetics testing 07/29/2014 through the [employer] gene panel offered by [facility] found no deleterious mutations in ATM, BARD1, BRCA1, BRCA2, BRIP1, CDH1, CHEK2, EPCAM, MLH1, MRE11A, MSH2, MSH6, MUTYH, NBN, NF1, PALB2, PMS2, PTEN, RAD50, RAD51C, RAD51D, SMARCA4, STK11, or TP53.   (2) status post bilateral mastectomies 08/10/2014 showing  (a) on the  right, lobular carcinoma in situ  (b) on the left, a pT3 pN1, stage IIIA invasive lobular breast  cancer, HER-2 repeated  (3) chemotherapy to start 09/27/2014,l consisting of standard doxorubicin and cyclophosphamide in dose dense fashion 4 followed by paclitaxel weekly 12  (4) radiation to follow chemotherapy as appropriate  (5) tamoxifen was started neoadjuvantly on 07/13/2014 given the likely delay in definitive surgery while genetics results are pending; this will be held during chemotherapy   PLAN: Yeng is doing well today. The labs were reviewed in detail and were stable. She will proceed with cycle 2 of doxorubicin and cyclophosphamide as planned today. She will be holding the home doses of dexamethasone, given her strong heart racing with even half the dose BID with the last cycle. Nausea was not an issue for her previously, but she knows to rely on compazine and zofran if this is increased without the steroid use.   Due to repeat malfunction of her port today, we are going to proceed with a dye study tomorrow just to be sure. Otherwise the nurses have instilled cathflo and will update me about the results of this intervention.   She will return in 1 week for labs and a nadir visit. She understands and agrees with this plan. She knows the goal of treatment in her case is cure. She has been encouraged to call with any issues that might arise before her next visit here.  Laurie Panda, NP   10/25/2014 12:12 PM

## 2014-10-26 ENCOUNTER — Telehealth: Payer: Self-pay | Admitting: Oncology

## 2014-10-26 ENCOUNTER — Ambulatory Visit (HOSPITAL_COMMUNITY)
Admission: RE | Admit: 2014-10-26 | Discharge: 2014-10-26 | Disposition: A | Discharge status: Home / Self Care | Payer: BLUE CROSS/BLUE SHIELD | Source: Ambulatory Visit | Attending: Nurse Practitioner | Admitting: Nurse Practitioner

## 2014-10-26 DIAGNOSIS — Z452 Encounter for adjustment and management of vascular access device: Secondary | ICD-10-CM | POA: Insufficient documentation

## 2014-10-26 DIAGNOSIS — Z853 Personal history of malignant neoplasm of breast: Secondary | ICD-10-CM | POA: Insufficient documentation

## 2014-10-26 DIAGNOSIS — C50412 Malignant neoplasm of upper-outer quadrant of left female breast: Secondary | ICD-10-CM

## 2014-10-26 MED ORDER — HEPARIN SOD (PORK) LOCK FLUSH 100 UNIT/ML IV SOLN
500.0000 [IU] | Freq: Once | INTRAVENOUS | Status: AC
  Administered 2014-10-26: 500 [IU]

## 2014-10-26 MED ORDER — IOHEXOL 300 MG/ML  SOLN
10.0000 mL | Freq: Once | INTRAMUSCULAR | Status: DC | PRN
  Administered 2014-10-26: 10 mL via INTRAVENOUS
  Filled 2014-10-26: qty 10

## 2014-10-26 MED ORDER — HEPARIN SOD (PORK) LOCK FLUSH 100 UNIT/ML IV SOLN
INTRAVENOUS | Status: AC
  Administered 2014-10-26: 500 [IU]
  Filled 2014-10-26: qty 5

## 2014-10-26 NOTE — Procedures (Signed)
Appropriately positioned and functioning port-a-catheter. The port is ready for continued immediate use.  Ronny Bacon, MD Pager #: [phone]

## 2014-10-26 NOTE — Telephone Encounter (Signed)
Per HB moved 8/30 lab/GM to 8/29. Spoke with patient she is aware.

## 2014-10-31 ENCOUNTER — Other Ambulatory Visit (HOSPITAL_BASED_OUTPATIENT_CLINIC_OR_DEPARTMENT_OTHER): Payer: BLUE CROSS/BLUE SHIELD

## 2014-10-31 ENCOUNTER — Ambulatory Visit (HOSPITAL_BASED_OUTPATIENT_CLINIC_OR_DEPARTMENT_OTHER): Payer: BLUE CROSS/BLUE SHIELD | Admitting: Oncology

## 2014-10-31 VITALS — BP 121/74 | HR 94 | Temp 98.1°F | Resp 18 | Ht 68.0 in | Wt 212.8 lb

## 2014-10-31 DIAGNOSIS — C50412 Malignant neoplasm of upper-outer quadrant of left female breast: Secondary | ICD-10-CM | POA: Diagnosis not present

## 2014-10-31 DIAGNOSIS — C773 Secondary and unspecified malignant neoplasm of axilla and upper limb lymph nodes: Secondary | ICD-10-CM

## 2014-10-31 DIAGNOSIS — Z170 Estrogen receptor positive status [ER+]: Secondary | ICD-10-CM

## 2014-10-31 LAB — CBC WITH DIFFERENTIAL/PLATELET
BASO%: 2.1 % — AB (ref 0.0–2.0)
Basophils Absolute: 0.1 10*3/uL (ref 0.0–0.1)
EOS%: 0.3 % (ref 0.0–7.0)
Eosinophils Absolute: 0 10*3/uL (ref 0.0–0.5)
HCT: 37.6 % (ref 34.8–46.6)
HGB: 12.6 g/dL (ref 11.6–15.9)
LYMPH%: 26.5 % (ref 14.0–49.7)
MCH: 29.6 pg (ref 25.1–34.0)
MCHC: 33.5 g/dL (ref 31.5–36.0)
MCV: 88.5 fL (ref 79.5–101.0)
MONO#: 0.1 10*3/uL (ref 0.1–0.9)
MONO%: 3.2 % (ref 0.0–14.0)
NEUT%: 67.9 % (ref 38.4–76.8)
NEUTROS ABS: 2.6 10*3/uL (ref 1.5–6.5)
Platelets: 227 10*3/uL (ref 145–400)
RBC: 4.25 10*6/uL (ref 3.70–5.45)
RDW: 13.2 % (ref 11.2–14.5)
WBC: 3.8 10*3/uL — AB (ref 3.9–10.3)
lymph#: 1 10*3/uL (ref 0.9–3.3)

## 2014-10-31 LAB — COMPREHENSIVE METABOLIC PANEL (CC13)
ALT: 32 U/L (ref 0–55)
ANION GAP: 8 meq/L (ref 3–11)
AST: 19 U/L (ref 5–34)
Albumin: 3.8 g/dL (ref 3.5–5.0)
Alkaline Phosphatase: 162 U/L — ABNORMAL HIGH (ref 40–150)
BILIRUBIN TOTAL: 0.5 mg/dL (ref 0.20–1.20)
BUN: 12.2 mg/dL (ref 7.0–26.0)
CHLORIDE: 104 meq/L (ref 98–109)
CO2: 26 meq/L (ref 22–29)
CREATININE: 0.7 mg/dL (ref 0.6–1.1)
Calcium: 9.2 mg/dL (ref 8.4–10.4)
EGFR: >90 mL/min/{1.73_m2} (ref >90)
GLUCOSE: 116 mg/dL (ref 70–140)
Potassium: 3.9 mEq/L (ref 3.5–5.1)
SODIUM: 138 meq/L (ref 136–145)
TOTAL PROTEIN: 6.8 g/dL (ref 6.4–8.3)

## 2014-10-31 MED ORDER — PROCHLORPERAZINE MALEATE 10 MG PO TABS: 10.0000 mg | ORAL_TABLET | Freq: Four times a day (QID) | ORAL | Status: DC | PRN

## 2014-10-31 MED ORDER — VALACYCLOVIR HCL 500 MG PO TABS: 500.0000 mg | ORAL_TABLET | Freq: Two times a day (BID) | ORAL | Status: DC

## 2014-10-31 NOTE — Progress Notes (Signed)
[facility]  [phone] Fax:([phone]) [phone]   ID: Lori [name] DOB: 18-Sep-1965  MR#: [id number]  GYF#:[id number]  Patient Care Team: Delilah Shan, MD as PCP - General (Family Medicine) [name], MD as Consulting Physician (General Surgery) [name], MD as Consulting Physician (Oncology) [name], MD as Attending Physician (Radiation Oncology) [name], RN as Registered Nurse [name], RN as Registered Nurse [name], NP as Nurse Practitioner (Nurse Practitioner) PCP: [name], MD GYN: [name] MD OTHER MD:  CHIEF COMPLAINT: Estrogen receptor positive breast cancer  CURRENT TREATMENT: adjuvant chemotherapy  BREAST CANCER HISTORY: From the original intake note:  Lori [name] (who is the daughter of my former patient, [name], herself now 10 years out from her T1 cN0 invasive ductal carcinoma, treated with anti-estrogens only) [facility] April 20 03/23/2014. Breast density was category C. A possible mass in the left breast upper outer quadrant was felt to warrant further evaluation, and on 07/04/2014 the patient underwent left mammography with tomosynthesis and left breast ultrasonography. At this point the patient felt she was able to palpate a mass in the area in question. Tomosynthesis did reveal an irregular mass in the upper outer left breast measuring 2.3 cm, associated on physical exam with a large area of thickening. Ultrasound of the area in question confirmed an irregular mass at the 1:00 position 5 cm from the nipple measuring 2.2 cm. The left axilla was negative sonographically.  Biopsy of the mass in question the same day, 07/04/2014, showed (SAA [phone]) an invasive lobular breast cancer which was estrogen receptor 50% positive with strong staining intensity, progesterone receptor 1% positive with moderate staining intensity, with a proliferation  marker of 1%, and no HER-2 amplification, the signals ratio being 1.19 and the number per cell 1.55.  On 07/08/2014 the patient underwent bilateral breast MRI. This showed no abnormal adenopathy and no abnormality in the right breast. In the left breast however there was a 9.3 area of abnormal enhancement involving all quadrants. Within the upper outer quadrant there was a denser area which measured up to 5.1 cm.  The patient's subsequent history is as detailed below  INTERVAL HISTORY: Lori [name] returns today for follow-up of her breast cancer, accompanied by her husband. Today is day 7 cycle 2 of doxorubicin and cyclophosphamide, with neulasta given on day 2 for granulocyte support. She had some problems with the first cycle but they were addressed and cycle 2 was considerably better. She also had a port study which shows her port is in excellent place with no kink or leak , easily accessed by radiology.  REVIEW OF SYSTEMS: Lori [name]  Has fewer mouth sores this time, but she still has some. She did not have thrush. The other symptom that is a little bit worse is a strange type of palpitation that occurs when she lies down at home after her first days treatment. She feels like her heart jumps to her head and she is shaky for a moment and then half an hour later it happens again. Basically this happened several times twin 10 PM and 1 AM after which it didn't happen anymore. She forgot to mention that to [place] when we saw her after her first cycle but it happened with the first cycle as well. She does have a little bit of a sore throat, blurred vision, but no nausea or vomiting. She has been having some palpitations. Of  course she lost her hair and  Her children were initially surprised to see her looking this way. A detailed review of systems today was otherwise stable  PAST MEDICAL HISTORY: Past Medical History  Diagnosis Date  . Asthma     last trimester of pregnancy 3 yrs ago-  never had problems since then,  no meds  . Breast cancer   . Anxiety     PAST SURGICAL HISTORY: Past Surgical History  Procedure Laterality Date  . Cesarean section      x 2  . Knee arthroscopy Left 07/27/2013    Procedure: LEFT ARTHROSCOPY KNEE WITH MEDIAL MENISCAL DEBRIDEMENT;  Surgeon: Gearlean Alf, MD;  Location: [facility];  Service: Orthopedics;  Laterality: Left;  Marland Kitchen Mastectomy w/ sentinel node biopsy Left 08/10/2014  . Mastectomy w/ sentinel node biopsy Bilateral 08/10/2014    Procedure: MASTECTOMY WITH SENTINEL LYMPH NODE BIOPSY;  Surgeon: [name], MD;  Location: [facility];  Service: General;  Laterality: Bilateral;  . Portacath placement Right 09/07/2014    Procedure: INSERTION PORT-A-CATH;  Surgeon: [name], MD;  Location: [facility];  Service: General;  Laterality: Right;    FAMILY HISTORY Family History  Problem Relation Age of Onset  . Breast cancer Mother 10  . Lymphoma Maternal Aunt 71  . Uterine cancer Paternal Aunt 56  . Uterine cancer Paternal Grandmother 69  . Ovarian cancer Other 32    mat great aunt through [facility]  . Ovarian cancer Maternal Grandmother 59  The patient's father died with congestive heart failure at the age of 61. The patient's mother is living, age 69. She was diagnosed with breast cancer at age 23. The patient's paternal grandmother was diagnosed with uterine cancer at age 15 and her daughter, the patient's paternal aunt, also with uterine cancer at age 37. On the mother's side one maternal aunt was diagnosed with lymphoma and the other with throat cancer. A maternal great aunt at age 27 was diagnosed with ovarian cancer.   GYNECOLOGIC HISTORY:  No LMP recorded.  menarche age 61, first live birth age 78, which the patient is aware double as the risk of breast cancer. She is GX P2. She still having regular periods. 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 Her husband Lori [name] the  third (goes by "[name]") works in Engineer, technical sales, although currently he is looking for work. Their children are Lori [name] 12 and Lori [name] 3. The patient is not a church attender    ADVANCED DIRECTIVES: Not in place   HEALTH MAINTENANCE: Social History  Substance Use Topics  . Smoking status: Former Smoker -- 1.00 packs/day for 8 years    Types: Cigarettes    Quit date: 07/20/2000  . Smokeless tobacco: Never Used  . Alcohol Use: Yes     Comment: socially     Colonoscopy:  PAP:  Bone density:  Lipid panel:  No Known Allergies  Current Outpatient Prescriptions  Medication Sig Dispense Refill  . dexamethasone (DECADRON) 4 MG tablet Take 2 tablets by mouth once a day on the day after chemotherapy and then take 2 tablets two times a day for 2 days. Take with food. (Patient not taking: Reported on 10/25/2014) 30 tablet 1  . Docusate Sodium (DULCOLAX STOOL SOFTENER PO) Take 1 tablet by mouth daily.    Marland Kitchen HYDROcodone-acetaminophen (NORCO/VICODIN) 5-325 MG per tablet Take 1-2 tablets by mouth every 4 (four) hours  as needed for moderate pain or severe pain. (Patient not taking: Reported on 09/16/2014) 50 tablet 0  . ibuprofen (ADVIL,MOTRIN) 200 MG tablet Take 600 mg by mouth every 6 (six) hours as needed.     . lidocaine-prilocaine (EMLA) cream Apply to affected area once 30 g 3  . loratadine (CLARITIN) 10 MG tablet Take 10 mg by mouth daily.    Marland Kitchen LORazepam (ATIVAN) 0.5 MG tablet Take 1 tablet (0.5 mg total) by mouth at bedtime. 30 tablet 0  . ondansetron (ZOFRAN) 8 MG tablet Take 1 tablet (8 mg total) by mouth 2 (two) times daily as needed. Start on the third day after chemotherapy. 30 tablet 1  . Probiotic Product (ALIGN PO) Take 1 capsule by mouth daily.    . prochlorperazine (COMPAZINE) 10 MG tablet Take 1 tablet (10 mg total) by mouth every 6 (six) hours as needed (Nausea or vomiting). 30 tablet 1  . tamoxifen (NOLVADEX) 20 MG tablet Take 20 mg by mouth daily.    Marland Kitchen zolpidem (AMBIEN) 5 MG tablet Take 1 tablet (5  mg total) by mouth at bedtime as needed for sleep. (Patient not taking: Reported on 09/16/2014) 10 tablet 0   No current facility-administered medications for this visit.    OBJECTIVE: young White woman  In no acute distress  Filed Vitals:   10/31/14 1114  BP: 121/74  Pulse: 94  Temp: 98.1 F (36.7 C)  Resp: 18     Body mass index is 32.36 kg/(m^2).    ECOG FS:1 - Symptomatic but completely ambulatory   Sclerae unicteric, pupils round and equal Oropharynx clear and moist-- no thrush or other lesions No cervical or supraclavicular adenopathy Lungs no rales or rhonchi Heart regular rate and rhythm Abd soft, nontender, positive bowel sounds MSK no focal spinal tenderness, no upper extremity lymphedema Neuro: nonfocal, well oriented, appropriate affect Breasts: Deferred    LAB RESULTS:  CMP     Component Value Date/Time   NA 143 10/25/2014 1019   NA 139 08/03/2014 1022   K 4.7 10/25/2014 1019   K 4.1 08/03/2014 1022   CL 106 08/03/2014 1022   CO2 25 10/25/2014 1019   CO2 25 08/03/2014 1022   GLUCOSE 82 10/25/2014 1019   GLUCOSE 89 08/03/2014 1022   BUN 15.1 10/25/2014 1019   BUN 10 08/03/2014 1022   CREATININE 0.7 10/25/2014 1019   CREATININE 0.68 08/03/2014 1022   CALCIUM 9.4 10/25/2014 1019   CALCIUM 9.3 08/03/2014 1022   PROT 6.5 10/25/2014 1019   ALBUMIN 3.6 10/25/2014 1019   AST 26 10/25/2014 1019   ALT 58* 10/25/2014 1019   ALKPHOS 108 10/25/2014 1019   BILITOT <0.20 10/25/2014 1019   GFRNONAA >60 08/03/2014 1022   GFRAA >60 08/03/2014 1022    INo results found for: SPEP, UPEP  Lab Results  Component Value Date   WBC 3.8* 10/31/2014   NEUTROABS 2.6 10/31/2014   HGB 12.6 10/31/2014   HCT 37.6 10/31/2014   MCV 88.5 10/31/2014   PLT 227 10/31/2014      Chemistry      Component Value Date/Time   NA 143 10/25/2014 1019   NA 139 08/03/2014 1022   K 4.7 10/25/2014 1019   K 4.1 08/03/2014 1022   CL 106 08/03/2014 1022   CO2 25 10/25/2014 1019    CO2 25 08/03/2014 1022   BUN 15.1 10/25/2014 1019   BUN 10 08/03/2014 1022   CREATININE 0.7 10/25/2014 1019   CREATININE 0.68 08/03/2014  1022      Component Value Date/Time   CALCIUM 9.4 10/25/2014 1019   CALCIUM 9.3 08/03/2014 1022   ALKPHOS 108 10/25/2014 1019   AST 26 10/25/2014 1019   ALT 58* 10/25/2014 1019   BILITOT <0.20 10/25/2014 1019       No results found for: LABCA2  No components found for: LABCA125  No results for input(s): INR in the last 168 hours.  Urinalysis No results found for: COLORURINE, APPEARANCEUR, LABSPEC, PHURINE, GLUCOSEU, HGBUR, BILIRUBINUR, KETONESUR, PROTEINUR, UROBILINOGEN, NITRITE, LEUKOCYTESUR  STUDIES: Ir Cv Line Injection  10/26/2014   CLINICAL DATA:  History of breast cancer, post surgically placed right subclavian vein approach port a catheter on 09/17/2014, now with sluggish flow during attempted aspiration. Note, the patient has previously undergone a tPA dwell of the [facility] a Catheter.  Patient presents today for fluoroscopic guided Port a catheter injection.  EXAM: FLUOROSCOPIC GUIDED PORTA CATHETER INJECTION  COMPARISON:  Chest radiograph- 09/07/2014  CONTRAST:  54m OMNIPAQUE IOHEXOL 300 MG/ML  SOLN  FLUOROSCOPY TIME:  18 seconds (14 mGy)  TECHNIQUE: The Port a Catheter was sterilely accessed. The patient was positioned supine on the fluoroscopy table.  A preprocedural spot fluoroscopic image was obtained of the right upper chest and existing right subclavian vein approach port a catheter.  Multiple angiographic images were obtained following the injection of a small amount of contrast.  FINDINGS: Preprocedural spot fluoroscopic image demonstrates unchanged and appropriate positioning of the right subclavian vein approach port a catheter with tip terminating over the distal SVC. There is no evidence of catheter tubing kink or fracture.  Contrast injection demonstrates wide patency of the Port a catheter without evidence of contrast extravasation  or a fibrin sheath formation about the Port a catheter's tip.  Note, the Port a Catheter was noted to easily aspirate and flush.  IMPRESSION: Appropriate position functioning right subclavian vein approach port a catheter. The Port a catheter is ready for immediate use.   Electronically Signed   By: JSandi MariscalM.D.   On: 10/26/2014 12:03    ASSESSMENT: 49y.o. BRCA negative [place] woman status post left breast biopsy 07/04/2014 for a clinical T3 N0, stage IIA invasive lobular breast cancer, grade 2, estrogen receptor positive, progesterone receptor 1% "positive", with an MIB-1 of less than 5% and no HER-2 amplification  (1) genetics testing 07/29/2014 through the [employer] gene panel offered by [facility] no deleterious mutations in ATM, BARD1, BRCA1, BRCA2, BRIP1, CDH1, CHEK2, EPCAM, MLH1, MRE11A, MSH2, MSH6, MUTYH, NBN, NF1, PALB2, PMS2, PTEN, RAD50, RAD51C, RAD51D, SMARCA4, STK11, or TP53.   (2) status post bilateral mastectomies 08/10/2014 showing  (a) on the right, lobular carcinoma in situ  (b) on the left, a pT3 pN1, stage IIIA invasive lobular breast cancer, HER-2 repeated  (3) chemotherapy to start 09/27/2014,l consisting of standard doxorubicin and cyclophosphamide in dose dense fashion 4, to be followed by paclitaxel weekly 12  (4) radiation to follow chemotherapy   (5) tamoxifen was started neoadjuvantly on 07/13/2014 given the likely delay in definitive surgery while genetics results are pending; it is being held during chemotherapy   PLAN: Lori [name]  Is tolerating her chemotherapy remarkably well. The one strange symptom she reports his this feeling like her heart jumps the evening of chemotherapy when she lies down at home. This doesn't happen any other time. It is very likely to be due to medication and among the medication she received most likely the dexamethasone. We have already discontinued the  oral dexamethasone, but we have not changed the dexamethasone in the  premeds.  Today I dropped her premed dexamethasone dose from 12 to 4 mg. I hope that takes care of the problem. If it does not we will eliminate dexamethasone pretreatment from her fourth cycle of Cytoxan/ Adriamycin   I think she would do well to take Valtrex daily until she is done with chemotherapy. This showed prevent her developing mouth ulcers with this subsequent treatment. I went ahead and put in that prescription for her.  I also refilled her Compazine dose.  Otherwise she is doing very well with her treatments and we will complete her Cytoxan/Adriamycin portion late September. We will discuss the paclitaxel portion in detail just before she starts those treatments, which will require yet another change in her anti-emetics  MAGRINAT,GUSTAV C, MD   10/31/2014 11:49 AM

## 2014-11-01 ENCOUNTER — Ambulatory Visit: Payer: BLUE CROSS/BLUE SHIELD | Admitting: Oncology

## 2014-11-01 ENCOUNTER — Other Ambulatory Visit: Payer: BLUE CROSS/BLUE SHIELD

## 2014-11-02 ENCOUNTER — Encounter: Payer: Self-pay | Admitting: Oncology

## 2014-11-02 NOTE — Progress Notes (Signed)
I faxed notes from 03/04/14-present to [employer]    [phone]

## 2014-11-08 ENCOUNTER — Ambulatory Visit (HOSPITAL_BASED_OUTPATIENT_CLINIC_OR_DEPARTMENT_OTHER): Payer: BLUE CROSS/BLUE SHIELD | Admitting: Nurse Practitioner

## 2014-11-08 ENCOUNTER — Ambulatory Visit (HOSPITAL_BASED_OUTPATIENT_CLINIC_OR_DEPARTMENT_OTHER): Payer: BLUE CROSS/BLUE SHIELD

## 2014-11-08 ENCOUNTER — Other Ambulatory Visit (HOSPITAL_BASED_OUTPATIENT_CLINIC_OR_DEPARTMENT_OTHER): Payer: BLUE CROSS/BLUE SHIELD

## 2014-11-08 ENCOUNTER — Ambulatory Visit: Payer: BLUE CROSS/BLUE SHIELD

## 2014-11-08 ENCOUNTER — Other Ambulatory Visit: Payer: BLUE CROSS/BLUE SHIELD

## 2014-11-08 ENCOUNTER — Encounter: Payer: Self-pay | Admitting: Nurse Practitioner

## 2014-11-08 ENCOUNTER — Encounter: Payer: Self-pay | Admitting: *Deleted

## 2014-11-08 ENCOUNTER — Telehealth: Payer: Self-pay | Admitting: Nurse Practitioner

## 2014-11-08 VITALS — BP 112/63 | HR 93 | Temp 98.3°F | Resp 18 | Ht 68.0 in | Wt 214.9 lb

## 2014-11-08 DIAGNOSIS — Z170 Estrogen receptor positive status [ER+]: Secondary | ICD-10-CM

## 2014-11-08 DIAGNOSIS — Z5111 Encounter for antineoplastic chemotherapy: Secondary | ICD-10-CM | POA: Diagnosis not present

## 2014-11-08 DIAGNOSIS — C50412 Malignant neoplasm of upper-outer quadrant of left female breast: Secondary | ICD-10-CM

## 2014-11-08 DIAGNOSIS — Z95828 Presence of other vascular implants and grafts: Secondary | ICD-10-CM

## 2014-11-08 DIAGNOSIS — Z5189 Encounter for other specified aftercare: Secondary | ICD-10-CM | POA: Diagnosis not present

## 2014-11-08 DIAGNOSIS — D0501 Lobular carcinoma in situ of right breast: Secondary | ICD-10-CM | POA: Diagnosis not present

## 2014-11-08 LAB — COMPREHENSIVE METABOLIC PANEL (CC13)
ALT: 25 U/L (ref 0–55)
AST: 16 U/L (ref 5–34)
Albumin: 3.7 g/dL (ref 3.5–5.0)
Alkaline Phosphatase: 88 U/L (ref 40–150)
Anion Gap: 7 mEq/L (ref 3–11)
BUN: 11.1 mg/dL (ref 7.0–26.0)
CALCIUM: 9.3 mg/dL (ref 8.4–10.4)
CHLORIDE: 109 meq/L (ref 98–109)
CO2: 25 mEq/L (ref 22–29)
Creatinine: 0.7 mg/dL (ref 0.6–1.1)
EGFR: >90 mL/min/{1.73_m2} (ref >90)
Glucose: 108 mg/dl (ref 70–140)
POTASSIUM: 4.3 meq/L (ref 3.5–5.1)
SODIUM: 141 meq/L (ref 136–145)
Total Bilirubin: 0.2 mg/dL (ref 0.20–1.20)
Total Protein: 6.6 g/dL (ref 6.4–8.3)

## 2014-11-08 LAB — CBC WITH DIFFERENTIAL/PLATELET
BASO%: 0.9 % (ref 0.0–2.0)
BASOS ABS: 0.1 10*3/uL (ref 0.0–0.1)
EOS%: 0.6 % (ref 0.0–7.0)
Eosinophils Absolute: 0 10*3/uL (ref 0.0–0.5)
HEMATOCRIT: 36.6 % (ref 34.8–46.6)
HGB: 12.6 g/dL (ref 11.6–15.9)
LYMPH%: 19.9 % (ref 14.0–49.7)
MCH: 30.1 pg (ref 25.1–34.0)
MCHC: 34.5 g/dL (ref 31.5–36.0)
MCV: 87.3 fL (ref 79.5–101.0)
MONO#: 0.8 10*3/uL (ref 0.1–0.9)
MONO%: 10.1 % (ref 0.0–14.0)
NEUT#: 5.7 10*3/uL (ref 1.5–6.5)
NEUT%: 68.5 % (ref 38.4–76.8)
Platelets: 214 10*3/uL (ref 145–400)
RBC: 4.2 10*6/uL (ref 3.70–5.45)
RDW: 13.9 % (ref 11.2–14.5)
WBC: 8.3 10*3/uL (ref 3.9–10.3)
lymph#: 1.7 10*3/uL (ref 0.9–3.3)

## 2014-11-08 MED ORDER — SODIUM CHLORIDE 0.9 % IJ SOLN
10.0000 mL | INTRAMUSCULAR | Status: DC | PRN
  Administered 2014-11-08: 10 mL via INTRAVENOUS
  Filled 2014-11-08: qty 10

## 2014-11-08 MED ORDER — SODIUM CHLORIDE 0.9 % IV SOLN
Freq: Once | INTRAVENOUS | Status: AC
  Administered 2014-11-08: 10:00:00 via INTRAVENOUS

## 2014-11-08 MED ORDER — SODIUM CHLORIDE 0.9 % IV SOLN
Freq: Once | INTRAVENOUS | Status: AC
  Administered 2014-11-08: 11:00:00 via INTRAVENOUS
  Filled 2014-11-08: qty 5

## 2014-11-08 MED ORDER — SODIUM CHLORIDE 0.9 % IJ SOLN
10.0000 mL | INTRAMUSCULAR | Status: DC | PRN
  Administered 2014-11-08: 10 mL
  Filled 2014-11-08: qty 10

## 2014-11-08 MED ORDER — PALONOSETRON HCL INJECTION 0.25 MG/5ML
0.2500 mg | Freq: Once | INTRAVENOUS | Status: AC
  Administered 2014-11-08: 0.25 mg via INTRAVENOUS

## 2014-11-08 MED ORDER — PALONOSETRON HCL INJECTION 0.25 MG/5ML
INTRAVENOUS | Status: AC
  Filled 2014-11-08: qty 5

## 2014-11-08 MED ORDER — PEGFILGRASTIM 6 MG/0.6ML ~~LOC~~ PSKT
6.0000 mg | PREFILLED_SYRINGE | Freq: Once | SUBCUTANEOUS | Status: AC
  Administered 2014-11-08: 6 mg via SUBCUTANEOUS
  Filled 2014-11-08: qty 0.6

## 2014-11-08 MED ORDER — SODIUM CHLORIDE 0.9 % IV SOLN
600.0000 mg/m2 | Freq: Once | INTRAVENOUS | Status: AC
  Administered 2014-11-08: 1280 mg via INTRAVENOUS
  Filled 2014-11-08: qty 64

## 2014-11-08 MED ORDER — DOXORUBICIN HCL CHEMO IV INJECTION 2 MG/ML
60.0000 mg/m2 | Freq: Once | INTRAVENOUS | Status: AC
  Administered 2014-11-08: 128 mg via INTRAVENOUS
  Filled 2014-11-08: qty 64

## 2014-11-08 MED ORDER — HEPARIN SOD (PORK) LOCK FLUSH 100 UNIT/ML IV SOLN
500.0000 [IU] | Freq: Once | INTRAVENOUS | Status: AC | PRN
  Administered 2014-11-08: 500 [IU]
  Filled 2014-11-08: qty 5

## 2014-11-08 NOTE — Progress Notes (Signed)
[name]  [phone] [phone]   ID: [name] DOB: [birth year]  MR#: [id number]  SNK#:[id number]  Patient Care Team: Delilah Shan, MD as PCP - General (Family Medicine) [name], MD as Consulting Physician (General Surgery) [name], MD as Consulting Physician (Oncology) [name], MD as Attending Physician (Radiation Oncology) [name], RN as Registered Nurse [name], RN as Registered Nurse [name], NP as Nurse Practitioner (Nurse Practitioner) PCP: [name], MD GYN: [name] MD OTHER MD:  CHIEF COMPLAINT: Estrogen receptor positive breast cancer  CURRENT TREATMENT: adjuvant chemotherapy  BREAST CANCER HISTORY: From the original intake note:  Lori [name] (who is the daughter of my former patient, Lori [name], herself now 10 years out from her T1 cN0 invasive ductal carcinoma, treated with anti-estrogens only) [facility] April 20 03/23/2014. Breast density was category C. A possible mass in the left breast upper outer quadrant was felt to warrant further evaluation, and on 07/04/2014 the patient underwent left mammography with tomosynthesis and left breast ultrasonography. At this point the patient felt she was able to palpate a mass in the area in question. Tomosynthesis did reveal an irregular mass in the upper outer left breast measuring 2.3 cm, associated on physical exam with a large area of thickening. Ultrasound of the area in question confirmed an irregular mass at the 1:00 position 5 cm from the nipple measuring 2.2 cm. The left axilla was negative sonographically.  Biopsy of the mass in question the same day, 07/04/2014, showed ([id number] [phone]) an invasive lobular breast cancer which was estrogen receptor 50% positive with strong staining intensity, progesterone receptor 1% positive with moderate staining intensity, with a proliferation  marker of 1%, and no HER-2 amplification, the signals ratio being 1.19 and the number per cell 1.55.  On 07/08/2014 the patient underwent bilateral breast MRI. This showed no abnormal adenopathy and no abnormality in the right breast. In the left breast however there was a 9.3 area of abnormal enhancement involving all quadrants. Within the upper outer quadrant there was a denser area which measured up to 5.1 cm.  The patient's subsequent history is as detailed below  INTERVAL HISTORY: Lori [name] returns today for follow-up of her breast cancer, accompanied by her husband. Today is day 1, cycle 3 of doxorubicin and cyclophosphamide, with neulasta given on day 2 for granulocyte support.   REVIEW OF SYSTEMS: Cinnamon denies fevers, chills, nausea, or vomiting. She stopped the valacyclovir, because she did not believe they were helping her mouth sores, and maybe even made them worse. Additionally, it likely made her more constipated. She is up to 3 dulcolax stool softeners daily, and is using miralax BID. She is moving her bowels but they are still very solid. She is using magic mouthwash PRN for her mucositis. She is eating and drinking well. She was more fatigued this week than usual. She uses ambien to sleep PRN. A detailed review of systems is otherwise stable.    PAST MEDICAL HISTORY: Past Medical History  Diagnosis Date  . Asthma     last trimester of pregnancy 3 yrs ago-  never had problems since then, no meds  . Breast cancer   . Anxiety     PAST SURGICAL HISTORY: Past Surgical History  Procedure Laterality Date  . Cesarean section      x 2  . Knee arthroscopy Left 07/27/2013    Procedure: LEFT ARTHROSCOPY KNEE  WITH MEDIAL MENISCAL DEBRIDEMENT;  Surgeon: Gearlean Alf, MD;  Location: [facility];  Service: Orthopedics;  Laterality: Left;  Marland Kitchen Mastectomy w/ sentinel node biopsy Left 08/10/2014  . Mastectomy w/ sentinel node biopsy Bilateral 08/10/2014    Procedure: MASTECTOMY WITH SENTINEL LYMPH  NODE BIOPSY;  Surgeon: [name], MD;  Location: [facility];  Service: General;  Laterality: Bilateral;  . Portacath placement Right 09/07/2014    Procedure: INSERTION PORT-A-CATH;  Surgeon: [name], MD;  Location: [facility];  Service: General;  Laterality: Right;    FAMILY HISTORY Family History  Problem Relation Age of Onset  . Breast cancer Mother 84  . Lymphoma Maternal Aunt 71  . Uterine cancer Paternal Aunt 69  . Uterine cancer Paternal Grandmother 15  . Ovarian cancer Other 32    mat great aunt through [facility]  . Ovarian cancer Maternal Grandmother 20  The patient's father died with congestive heart failure at the age of 88. The patient's mother is living, age 72. She was diagnosed with breast cancer at age 2. The patient's paternal grandmother was diagnosed with uterine cancer at age 52 and her daughter, the patient's paternal aunt, also with uterine cancer at age 30. On the mother's side one maternal aunt was diagnosed with lymphoma and the other with throat cancer. A maternal great aunt at age 40 was diagnosed with ovarian cancer.   GYNECOLOGIC HISTORY:  No LMP recorded.  menarche age 88, first live birth age 34, which the patient is aware double as the risk of breast cancer. She is GX P2. She still having regular periods. 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 Her husband [name] the third (goes by "[name]") works in Engineer, technical sales, although currently he is looking for work. Their children are [name] 12 and [name] 3. The patient is not a church attender    ADVANCED DIRECTIVES: Not in place   HEALTH MAINTENANCE: Social History  Substance Use Topics  . Smoking status: Former Smoker -- 1.00 packs/day for 8 years    Types: Cigarettes    Quit date: 07/20/2000  . Smokeless tobacco: Never Used  . Alcohol Use: Yes     Comment: socially     Colonoscopy:  PAP:  Bone density:  Lipid  panel:  No Known Allergies  Current Outpatient Prescriptions  Medication Sig Dispense Refill  . Docusate Sodium (DULCOLAX STOOL SOFTENER PO) Take 1 tablet by mouth daily.    Marland Kitchen lidocaine-prilocaine (EMLA) cream Apply to affected area once 30 g 3  . loratadine (CLARITIN) 10 MG tablet Take 10 mg by mouth daily.    Marland Kitchen LORazepam (ATIVAN) 0.5 MG tablet Take 1 tablet (0.5 mg total) by mouth at bedtime. 30 tablet 0  . ondansetron (ZOFRAN) 8 MG tablet Take 1 tablet (8 mg total) by mouth 2 (two) times daily as needed. Start on the third day after chemotherapy. 30 tablet 1  . Probiotic Product (ALIGN PO) Take 1 capsule by mouth daily.    . prochlorperazine (COMPAZINE) 10 MG tablet Take 1 tablet (10 mg total) by mouth every 6 (six) hours as needed (Nausea or vomiting). 30 tablet 3  . ibuprofen (ADVIL,MOTRIN) 200 MG tablet Take 600 mg by mouth every 6 (six) hours as needed.     . valACYclovir (VALTREX) 500 MG tablet Take 1 tablet (500 mg total) by mouth 2 (two) times daily. (Patient not taking: Reported on 11/08/2014)  90 tablet 2   No current facility-administered medications for this visit.    OBJECTIVE: young White woman  In no acute distress  Filed Vitals:   11/08/14 0831  BP: 112/63  Pulse: 93  Temp: 98.3 F (36.8 C)  Resp: 18     Body mass index is 32.68 kg/(m^2).    ECOG FS:1 - Symptomatic but completely ambulatory   Skin: warm, dry  HEENT: sclerae anicteric, conjunctivae pink, oropharynx clear. No thrush or mucositis.  Lymph Nodes: No cervical or supraclavicular lymphadenopathy  Lungs: clear to auscultation bilaterally, no rales, wheezes, or rhonci  Heart: regular rate and rhythm  Abdomen: round, soft, non tender, positive bowel sounds  Musculoskeletal: No focal spinal tenderness, no peripheral edema  Neuro: non focal, well oriented, positive affect  Breasts: deferred  LAB RESULTS:  CMP     Component Value Date/Time   NA 141 11/08/2014 0811   NA 139 08/03/2014 1022   K 4.3  11/08/2014 0811   K 4.1 08/03/2014 1022   CL 106 08/03/2014 1022   CO2 25 11/08/2014 0811   CO2 25 08/03/2014 1022   GLUCOSE 108 11/08/2014 0811   GLUCOSE 89 08/03/2014 1022   BUN 11.1 11/08/2014 0811   BUN 10 08/03/2014 1022   CREATININE 0.7 11/08/2014 0811   CREATININE 0.68 08/03/2014 1022   CALCIUM 9.3 11/08/2014 0811   CALCIUM 9.3 08/03/2014 1022   PROT 6.6 11/08/2014 0811   ALBUMIN 3.7 11/08/2014 0811   AST 16 11/08/2014 0811   ALT 25 11/08/2014 0811   ALKPHOS 88 11/08/2014 0811   BILITOT 0.20 11/08/2014 0811   GFRNONAA >60 08/03/2014 1022   GFRAA >60 08/03/2014 1022    INo results found for: SPEP, UPEP  Lab Results  Component Value Date   WBC 8.3 11/08/2014   NEUTROABS 5.7 11/08/2014   HGB 12.6 11/08/2014   HCT 36.6 11/08/2014   MCV 87.3 11/08/2014   PLT 214 11/08/2014      Chemistry      Component Value Date/Time   NA 141 11/08/2014 0811   NA 139 08/03/2014 1022   K 4.3 11/08/2014 0811   K 4.1 08/03/2014 1022   CL 106 08/03/2014 1022   CO2 25 11/08/2014 0811   CO2 25 08/03/2014 1022   BUN 11.1 11/08/2014 0811   BUN 10 08/03/2014 1022   CREATININE 0.7 11/08/2014 0811   CREATININE 0.68 08/03/2014 1022      Component Value Date/Time   CALCIUM 9.3 11/08/2014 0811   CALCIUM 9.3 08/03/2014 1022   ALKPHOS 88 11/08/2014 0811   AST 16 11/08/2014 0811   ALT 25 11/08/2014 0811   BILITOT 0.20 11/08/2014 0811       No results found for: LABCA2  No components found for: LABCA125  No results for input(s): INR in the last 168 hours.  Urinalysis No results found for: COLORURINE, APPEARANCEUR, LABSPEC, PHURINE, GLUCOSEU, HGBUR, BILIRUBINUR, KETONESUR, PROTEINUR, UROBILINOGEN, NITRITE, LEUKOCYTESUR  STUDIES: Ir Cv Line Injection  10/26/2014   CLINICAL DATA:  History of breast cancer, post surgically placed right subclavian vein approach port a catheter on 09/17/2014, now with sluggish flow during attempted aspiration. Note, the patient has previously  undergone a tPA dwell of the [facility] a Catheter.  Patient presents today for fluoroscopic guided Port a catheter injection.  EXAM: FLUOROSCOPIC GUIDED PORTA CATHETER INJECTION  COMPARISON:  Chest radiograph- 09/07/2014  CONTRAST:  103m OMNIPAQUE IOHEXOL 300 MG/ML  SOLN  FLUOROSCOPY TIME:  18 seconds (14 mGy)  TECHNIQUE: The  Port a Catheter was sterilely accessed. The patient was positioned supine on the fluoroscopy table.  A preprocedural spot fluoroscopic image was obtained of the right upper chest and existing right subclavian vein approach port a catheter.  Multiple angiographic images were obtained following the injection of a small amount of contrast.  FINDINGS: Preprocedural spot fluoroscopic image demonstrates unchanged and appropriate positioning of the right subclavian vein approach port a catheter with tip terminating over the distal SVC. There is no evidence of catheter tubing kink or fracture.  Contrast injection demonstrates wide patency of the Port a catheter without evidence of contrast extravasation or a fibrin sheath formation about the Port a catheter's tip.  Note, the Port a Catheter was noted to easily aspirate and flush.  IMPRESSION: Appropriate position functioning right subclavian vein approach port a catheter. The Port a catheter is ready for immediate use.   Electronically Signed   By: Sandi Mariscal M.D.   On: 10/26/2014 12:03    ASSESSMENT: 49 y.o. BRCA negative [place] woman status post left breast biopsy 07/04/2014 for a clinical T3 N0, stage IIA invasive lobular breast cancer, grade 2, estrogen receptor positive, progesterone receptor 1% "positive", with an MIB-1 of less than 5% and no HER-2 amplification  (1) genetics testing 07/29/2014 through the [employer] gene panel offered by [employer] found no deleterious mutations in ATM, BARD1, BRCA1, BRCA2, BRIP1, CDH1, CHEK2, EPCAM, MLH1, MRE11A, MSH2, MSH6, MUTYH, NBN, NF1, PALB2, PMS2, PTEN, RAD50, RAD51C, RAD51D, SMARCA4, STK11, or  TP53.   (2) status post bilateral mastectomies 08/10/2014 showing  (a) on the right, lobular carcinoma in situ  (b) on the left, a pT3 pN1, stage IIIA invasive lobular breast cancer, HER-2 repeated  (3) chemotherapy to start 09/27/2014,l consisting of standard doxorubicin and cyclophosphamide in dose dense fashion 4, to be followed by paclitaxel weekly 12  (4) radiation to follow chemotherapy   (5) tamoxifen was started neoadjuvantly on 07/13/2014 given the likely delay in definitive surgery while genetics results are pending; it is being held during chemotherapy   PLAN: Zorianna looks and feels well today. The labs were reviewed in detail and were entirely normal. She will proceed with cycle 3 of doxorubicin and cyclophosphamide as planned today.   Rayya will return in 1 week for labs and a nadir visit. She understands and agree with this plan. She knows the goal of treatment in her case is cure. She has been encouraged to call with any issues that might arise before her next visit here.    Laurie Panda, NP   11/08/2014 9:19 AM

## 2014-11-08 NOTE — Patient Instructions (Signed)

## 2014-11-08 NOTE — Patient Instructions (Signed)
Cancer Center Discharge Instructions for Patients Receiving Chemotherapy  Today you received the following chemotherapy agents:Adriamycin and Cytoxan   To help prevent nausea and vomiting after your treatment, we encourage you to take your nausea medication as directed.    If you develop nausea and vomiting that is not controlled by your nausea medication, call the clinic.   BELOW ARE SYMPTOMS THAT SHOULD BE REPORTED IMMEDIATELY:  *FEVER GREATER THAN 100.5 F  *CHILLS WITH OR WITHOUT FEVER  NAUSEA AND VOMITING THAT IS NOT CONTROLLED WITH YOUR NAUSEA MEDICATION  *UNUSUAL SHORTNESS OF BREATH  *UNUSUAL BRUISING OR BLEEDING  TENDERNESS IN MOUTH AND THROAT WITH OR WITHOUT PRESENCE OF ULCERS  *URINARY PROBLEMS  *BOWEL PROBLEMS  UNUSUAL RASH Items with * indicate a potential emergency and should be followed up as soon as possible.  Feel free to call the clinic you have any questions or concerns. The clinic phone number is [phone].  Please show the CHEMO ALERT CARD at check-in to the Emergency Department and triage nurse.   

## 2014-11-08 NOTE — Telephone Encounter (Signed)
Appointments made and avs printed for patient °

## 2014-11-15 ENCOUNTER — Ambulatory Visit (HOSPITAL_BASED_OUTPATIENT_CLINIC_OR_DEPARTMENT_OTHER): Payer: BLUE CROSS/BLUE SHIELD | Admitting: Nurse Practitioner

## 2014-11-15 ENCOUNTER — Other Ambulatory Visit (HOSPITAL_BASED_OUTPATIENT_CLINIC_OR_DEPARTMENT_OTHER): Payer: BLUE CROSS/BLUE SHIELD

## 2014-11-15 ENCOUNTER — Encounter: Payer: Self-pay | Admitting: Nurse Practitioner

## 2014-11-15 ENCOUNTER — Other Ambulatory Visit: Payer: Self-pay | Admitting: *Deleted

## 2014-11-15 VITALS — BP 118/59 | HR 103 | Temp 98.1°F | Resp 18 | Ht 68.0 in | Wt 217.1 lb

## 2014-11-15 DIAGNOSIS — C50412 Malignant neoplasm of upper-outer quadrant of left female breast: Secondary | ICD-10-CM | POA: Diagnosis not present

## 2014-11-15 DIAGNOSIS — R5383 Other fatigue: Secondary | ICD-10-CM | POA: Diagnosis not present

## 2014-11-15 DIAGNOSIS — D0501 Lobular carcinoma in situ of right breast: Secondary | ICD-10-CM | POA: Diagnosis not present

## 2014-11-15 DIAGNOSIS — K1230 Oral mucositis (ulcerative), unspecified: Secondary | ICD-10-CM | POA: Insufficient documentation

## 2014-11-15 DIAGNOSIS — Z170 Estrogen receptor positive status [ER+]: Secondary | ICD-10-CM | POA: Diagnosis not present

## 2014-11-15 DIAGNOSIS — K5900 Constipation, unspecified: Secondary | ICD-10-CM

## 2014-11-15 LAB — COMPREHENSIVE METABOLIC PANEL (CC13)
ALT: 14 U/L (ref 0–55)
AST: 13 U/L (ref 5–34)
Albumin: 3.7 g/dL (ref 3.5–5.0)
Alkaline Phosphatase: 150 U/L (ref 40–150)
Anion Gap: 8 mEq/L (ref 3–11)
BUN: 10.7 mg/dL (ref 7.0–26.0)
CHLORIDE: 106 meq/L (ref 98–109)
CO2: 27 meq/L (ref 22–29)
Calcium: 9.2 mg/dL (ref 8.4–10.4)
Creatinine: 0.7 mg/dL (ref 0.6–1.1)
GLUCOSE: 109 mg/dL (ref 70–140)
POTASSIUM: 3.7 meq/L (ref 3.5–5.1)
SODIUM: 140 meq/L (ref 136–145)
TOTAL PROTEIN: 6.7 g/dL (ref 6.4–8.3)
Total Bilirubin: 0.33 mg/dL (ref 0.20–1.20)

## 2014-11-15 LAB — CBC WITH DIFFERENTIAL/PLATELET
BASO%: 2.8 % — ABNORMAL HIGH (ref 0.0–2.0)
BASOS ABS: 0.2 10*3/uL — AB (ref 0.0–0.1)
EOS%: 0.7 % (ref 0.0–7.0)
Eosinophils Absolute: 0 10*3/uL (ref 0.0–0.5)
HCT: 34.7 % — ABNORMAL LOW (ref 34.8–46.6)
HGB: 12 g/dL (ref 11.6–15.9)
LYMPH%: 18.7 % (ref 14.0–49.7)
MCH: 30.1 pg (ref 25.1–34.0)
MCHC: 34.6 g/dL (ref 31.5–36.0)
MCV: 87 fL (ref 79.5–101.0)
MONO#: 0.3 10*3/uL (ref 0.1–0.9)
MONO%: 5.2 % (ref 0.0–14.0)
NEUT#: 3.9 10*3/uL (ref 1.5–6.5)
NEUT%: 72.6 % (ref 38.4–76.8)
NRBC: 0 % (ref 0–0)
Platelets: 159 10*3/uL (ref 145–400)
RBC: 3.99 10*6/uL (ref 3.70–5.45)
RDW: 13.9 % (ref 11.2–14.5)
WBC: 5.3 10*3/uL (ref 3.9–10.3)
lymph#: 1 10*3/uL (ref 0.9–3.3)

## 2014-11-15 MED ORDER — LORAZEPAM 0.5 MG PO TABS: 0.5000 mg | ORAL_TABLET | Freq: Every day | ORAL | Status: DC

## 2014-11-15 NOTE — Progress Notes (Signed)
Time  [phone] [phone]   ID: IZELA ALTIER DOB: [birth year]  MR#: [id number]  TKP#:[id number]  Patient Care Team: Delilah Shan, MD as PCP - General (Family Medicine) [name], MD as Consulting Physician (General Surgery) [name], MD as Consulting Physician (Oncology) [name], MD as Attending Physician (Radiation Oncology) [name], RN as Registered Nurse [name], RN as Registered Nurse [name], NP as Nurse Practitioner (Nurse Practitioner) PCP: [name], MD GYN: [name] MD OTHER MD:  CHIEF COMPLAINT: Estrogen receptor positive breast cancer  CURRENT TREATMENT: adjuvant chemotherapy  BREAST CANCER HISTORY: From the original intake note:  [name] (who is the daughter of my former patient, [name], herself now 10 years out from her T1 cN0 invasive ductal carcinoma, treated with anti-estrogens only) went [facility] April 20 03/23/2014. Breast density was category C. A possible mass in the left breast upper outer quadrant was felt to warrant further evaluation, and on 07/04/2014 the patient underwent left mammography with tomosynthesis and left breast ultrasonography. At this point the patient felt she was able to palpate a mass in the area in question. Tomosynthesis did reveal an irregular mass in the upper outer left breast measuring 2.3 cm, associated on physical exam with a large area of thickening. Ultrasound of the area in question confirmed an irregular mass at the 1:00 position 5 cm from the nipple measuring 2.2 cm. The left axilla was negative sonographically.  Biopsy of the mass in question the same day, 07/04/2014, showed ([facility]AA [phone]) an invasive lobular breast cancer which was estrogen receptor 50% positive with strong staining intensity, progesterone receptor 1% positive with moderate staining intensity, with a proliferation  marker of 1%, and no HER-2 amplification, the signals ratio being 1.19 and the number per cell 1.55.  On 07/08/2014 the patient underwent bilateral breast MRI. This showed no abnormal adenopathy and no abnormality in the right breast. In the left breast however there was a 9.3 area of abnormal enhancement involving all quadrants. Within the upper outer quadrant there was a denser area which measured up to 5.1 cm.  The patient's subsequent history is as detailed below  INTERVAL HISTORY: [name] returns today for follow-up of her breast cancer, alone. Today is day 8, cycle 3 of doxorubicin and cyclophosphamide, with neulasta given on day 2 for granulocyte support.   REVIEW OF SYSTEMS: Chrishonda denies fevers, chills, nausea, or vomiting. She has mouth sores and is using magic mouthwash for them. She does not believe that valacyclovir helps. She continues to have constipation, but has only been using stool softeners and has not taken any miralax. Her fatigue is much greater that usual. She is barely able to get out of bed, and has all of her meals brought to her A detailed review of systems is otherwise stable.     PAST MEDICAL HISTORY: Past Medical History  Diagnosis Date  . Asthma     last trimester of pregnancy 3 yrs ago-  never had problems since then, no meds  . Breast cancer   . Anxiety     PAST SURGICAL HISTORY: Past Surgical History  Procedure Laterality Date  . Cesarean section      x 2  . Knee arthroscopy Left 07/27/2013    Procedure: LEFT ARTHROSCOPY KNEE WITH MEDIAL MENISCAL DEBRIDEMENT;  Surgeon: Gearlean Alf, MD;  Location: [facility];  Service: Orthopedics;  Laterality: Left;  Marland Kitchen Mastectomy  w/ sentinel node biopsy Left 08/10/2014  . Mastectomy w/ sentinel node biopsy Bilateral 08/10/2014    Procedure: MASTECTOMY WITH SENTINEL LYMPH NODE BIOPSY;  Surgeon: [name], MD;  Location: [place];  Service: General;  Laterality: Bilateral;  . Portacath placement Right 09/07/2014     Procedure: INSERTION PORT-A-CATH;  Surgeon: [name], MD;  Location: [place];  Service: General;  Laterality: Right;    FAMILY HISTORY Family History  Problem Relation Age of Onset  . Breast cancer Mother 47  . Lymphoma Maternal Aunt 71  . Uterine cancer Paternal Aunt 60  . Uterine cancer Paternal Grandmother 78  . Ovarian cancer Other 87    mat great aunt through [facility]  . Ovarian cancer Maternal Grandmother 33  The patient's father died with congestive heart failure at the age of 52. The patient's mother is living, age 90. She was diagnosed with breast cancer at age 22. The patient's paternal grandmother was diagnosed with uterine cancer at age 59 and her daughter, the patient's paternal aunt, also with uterine cancer at age 84. On the mother's side one maternal aunt was diagnosed with lymphoma and the other with throat cancer. A maternal great aunt at age 51 was diagnosed with ovarian cancer.   GYNECOLOGIC HISTORY:  No LMP recorded.  menarche age 49, first live birth age 26, which the patient is aware double as the risk of breast cancer. She is GX P2. She still having regular periods. 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 Her husband JAELLA WEINERT the third (goes by "[name]") works in Engineer, technical sales, although currently he is looking for work. Their children are [name] 12 and [name] 3. The patient is not a church attender    ADVANCED DIRECTIVES: Not in place   HEALTH MAINTENANCE: Social History  Substance Use Topics  . Smoking status: Former Smoker -- 1.00 packs/day for 8 years    Types: Cigarettes    Quit date: 07/20/2000  . Smokeless tobacco: Never Used  . Alcohol Use: Yes     Comment: socially     Colonoscopy:  PAP:  Bone density:  Lipid panel:  No Known Allergies  Current Outpatient Prescriptions  Medication Sig Dispense Refill  . Docusate Sodium (DULCOLAX STOOL SOFTENER PO) Take 1  tablet by mouth daily.    Marland Kitchen ibuprofen (ADVIL,MOTRIN) 200 MG tablet Take 600 mg by mouth every 6 (six) hours as needed.     . lidocaine-prilocaine (EMLA) cream Apply to affected area once 30 g 3  . loratadine (CLARITIN) 10 MG tablet Take 10 mg by mouth daily.    . ondansetron (ZOFRAN) 8 MG tablet Take 1 tablet (8 mg total) by mouth 2 (two) times daily as needed. Start on the third day after chemotherapy. 30 tablet 1  . Probiotic Product (ALIGN PO) Take 1 capsule by mouth daily.    . prochlorperazine (COMPAZINE) 10 MG tablet Take 1 tablet (10 mg total) by mouth every 6 (six) hours as needed (Nausea or vomiting). 30 tablet 3  . LORazepam (ATIVAN) 0.5 MG tablet Take 1 tablet (0.5 mg total) by mouth at bedtime. 30 tablet 0  . valACYclovir (VALTREX) 500 MG tablet Take 1 tablet (500 mg total) by mouth 2 (two) times daily. (Patient not taking: Reported on 11/08/2014) 90 tablet 2   No current facility-administered medications for this visit.    OBJECTIVE: young White woman  In no acute  distress  Filed Vitals:   11/15/14 1305  BP: 118/59  Pulse: 103  Temp: 98.1 F (36.7 C)  Resp: 18     Body mass index is 33.02 kg/(m^2).    ECOG FS:1 - Symptomatic but completely ambulatory   Sclerae unicteric, pupils round and equal Oropharynx clear and moist-- no thrush or other lesions No cervical or supraclavicular adenopathy Lungs no rales or rhonchi Heart regular rate and rhythm Abd soft, nontender, positive bowel sounds MSK no focal spinal tenderness, no upper extremity lymphedema Neuro: nonfocal, well oriented, appropriate affect Breasts: deferred  LAB RESULTS:  CMP     Component Value Date/Time   NA 140 11/15/2014 1249   NA 139 08/03/2014 1022   K 3.7 11/15/2014 1249   K 4.1 08/03/2014 1022   CL 106 08/03/2014 1022   CO2 27 11/15/2014 1249   CO2 25 08/03/2014 1022   GLUCOSE 109 11/15/2014 1249   GLUCOSE 89 08/03/2014 1022   BUN 10.7 11/15/2014 1249   BUN 10 08/03/2014 1022   CREATININE  0.7 11/15/2014 1249   CREATININE 0.68 08/03/2014 1022   CALCIUM 9.2 11/15/2014 1249   CALCIUM 9.3 08/03/2014 1022   PROT 6.7 11/15/2014 1249   ALBUMIN 3.7 11/15/2014 1249   AST 13 11/15/2014 1249   ALT 14 11/15/2014 1249   ALKPHOS 150 11/15/2014 1249   BILITOT 0.33 11/15/2014 1249   GFRNONAA >60 08/03/2014 1022   GFRAA >60 08/03/2014 1022    INo results found for: SPEP, UPEP  Lab Results  Component Value Date   WBC 5.3 11/15/2014   NEUTROABS 3.9 11/15/2014   HGB 12.0 11/15/2014   HCT 34.7* 11/15/2014   MCV 87.0 11/15/2014   PLT 159 11/15/2014      Chemistry      Component Value Date/Time   NA 140 11/15/2014 1249   NA 139 08/03/2014 1022   K 3.7 11/15/2014 1249   K 4.1 08/03/2014 1022   CL 106 08/03/2014 1022   CO2 27 11/15/2014 1249   CO2 25 08/03/2014 1022   BUN 10.7 11/15/2014 1249   BUN 10 08/03/2014 1022   CREATININE 0.7 11/15/2014 1249   CREATININE 0.68 08/03/2014 1022      Component Value Date/Time   CALCIUM 9.2 11/15/2014 1249   CALCIUM 9.3 08/03/2014 1022   ALKPHOS 150 11/15/2014 1249   AST 13 11/15/2014 1249   ALT 14 11/15/2014 1249   BILITOT 0.33 11/15/2014 1249       No results found for: LABCA2  No components found for: LABCA125  No results for input(s): INR in the last 168 hours.  Urinalysis No results found for: COLORURINE, APPEARANCEUR, LABSPEC, PHURINE, GLUCOSEU, HGBUR, BILIRUBINUR, KETONESUR, PROTEINUR, UROBILINOGEN, NITRITE, LEUKOCYTESUR  STUDIES: Ir Cv Line Injection  10/26/2014   CLINICAL DATA:  History of breast cancer, post surgically placed right subclavian vein approach port a catheter on 09/17/2014, now with sluggish flow during attempted aspiration. Note, the patient has previously undergone a tPA dwell of the [facility] a Catheter.  Patient presents today for fluoroscopic guided Port a catheter injection.  EXAM: FLUOROSCOPIC GUIDED PORTA CATHETER INJECTION  COMPARISON:  Chest radiograph- 09/07/2014  CONTRAST:  73m OMNIPAQUE IOHEXOL  300 MG/ML  SOLN  FLUOROSCOPY TIME:  18 seconds (14 mGy)  TECHNIQUE: The Port a Catheter was sterilely accessed. The patient was positioned supine on the fluoroscopy table.  A preprocedural spot fluoroscopic image was obtained of the right upper chest and existing right subclavian vein approach port a catheter.  Multiple  angiographic images were obtained following the injection of a small amount of contrast.  FINDINGS: Preprocedural spot fluoroscopic image demonstrates unchanged and appropriate positioning of the right subclavian vein approach port a catheter with tip terminating over the distal SVC. There is no evidence of catheter tubing kink or fracture.  Contrast injection demonstrates wide patency of the Port a catheter without evidence of contrast extravasation or a fibrin sheath formation about the Port a catheter's tip.  Note, the Port a Catheter was noted to easily aspirate and flush.  IMPRESSION: Appropriate position functioning right subclavian vein approach port a catheter. The Port a catheter is ready for immediate use.   Electronically Signed   By: Sandi Mariscal M.D.   On: 10/26/2014 12:03    ASSESSMENT: 49 y.o. BRCA negative [place] woman status post left breast biopsy 07/04/2014 for a clinical T3 N0, stage IIA invasive lobular breast cancer, grade 2, estrogen receptor positive, progesterone receptor 1% "positive", with an MIB-1 of less than 5% and no HER-2 amplification  (1) genetics testing 07/29/2014 through the [employer] gene panel offered by [employer] found no deleterious mutations in ATM, BARD1, BRCA1, BRCA2, BRIP1, CDH1, CHEK2, EPCAM, MLH1, MRE11A, MSH2, MSH6, MUTYH, NBN, NF1, PALB2, PMS2, PTEN, RAD50, RAD51C, RAD51D, SMARCA4, STK11, or TP53.   (2) status post bilateral mastectomies 08/10/2014 showing  (a) on the right, lobular carcinoma in situ  (b) on the left, a pT3 pN1, stage IIIA invasive lobular breast cancer, HER-2 repeated  (3) chemotherapy to start 09/27/2014,l consisting  of standard doxorubicin and cyclophosphamide in dose dense fashion 4, to be followed by paclitaxel weekly 12  (4) radiation to follow chemotherapy   (5) tamoxifen was started neoadjuvantly on 07/13/2014 given the likely delay in definitive surgery while genetics results are pending; it is being held during chemotherapy   PLAN: Cherylynn is very concerned about her fatigue. She wonders if cutting the steroids has compounded this effect, but is faced with having more palpitations if she returns to them. We finally settled on raising her dexamethasone premed dose from 15m to 631mwith is 50% of the original dose. I don't think this will help her in a profound way, but she is very reluctant to take any oral doses.  She will continue to use the magic mouthwash until her mouth sores resolve. She will also add miralax back to her bowel regimen.   ReKenzlieill return in 1 week for labs and her 4th and final cycle of doxorubicin and cyclophosphamide. She understands and agree with this plan. She knows the goal of treatment in her case is cure. She has been encouraged to call with any issues that might arise before her next visit here.    HeLaurie PandaNP   11/15/2014 1:40 PM

## 2014-11-22 ENCOUNTER — Ambulatory Visit (HOSPITAL_BASED_OUTPATIENT_CLINIC_OR_DEPARTMENT_OTHER): Payer: BLUE CROSS/BLUE SHIELD | Admitting: Nurse Practitioner

## 2014-11-22 ENCOUNTER — Ambulatory Visit (HOSPITAL_BASED_OUTPATIENT_CLINIC_OR_DEPARTMENT_OTHER): Payer: BLUE CROSS/BLUE SHIELD

## 2014-11-22 ENCOUNTER — Other Ambulatory Visit: Payer: BLUE CROSS/BLUE SHIELD

## 2014-11-22 ENCOUNTER — Encounter: Payer: Self-pay | Admitting: Nurse Practitioner

## 2014-11-22 ENCOUNTER — Ambulatory Visit: Payer: BLUE CROSS/BLUE SHIELD

## 2014-11-22 ENCOUNTER — Ambulatory Visit (HOSPITAL_BASED_OUTPATIENT_CLINIC_OR_DEPARTMENT_OTHER): Payer: BLUE CROSS/BLUE SHIELD | Admitting: *Deleted

## 2014-11-22 VITALS — BP 117/61 | HR 92 | Temp 97.9°F | Resp 18 | Ht 68.0 in | Wt 217.9 lb

## 2014-11-22 DIAGNOSIS — C50412 Malignant neoplasm of upper-outer quadrant of left female breast: Secondary | ICD-10-CM | POA: Diagnosis not present

## 2014-11-22 DIAGNOSIS — Z95828 Presence of other vascular implants and grafts: Secondary | ICD-10-CM

## 2014-11-22 DIAGNOSIS — N951 Menopausal and female climacteric states: Secondary | ICD-10-CM | POA: Diagnosis not present

## 2014-11-22 DIAGNOSIS — F329 Major depressive disorder, single episode, unspecified: Secondary | ICD-10-CM

## 2014-11-22 DIAGNOSIS — F32A Depression, unspecified: Secondary | ICD-10-CM | POA: Insufficient documentation

## 2014-11-22 DIAGNOSIS — Z5111 Encounter for antineoplastic chemotherapy: Secondary | ICD-10-CM | POA: Diagnosis not present

## 2014-11-22 DIAGNOSIS — R232 Flushing: Secondary | ICD-10-CM

## 2014-11-22 DIAGNOSIS — R21 Rash and other nonspecific skin eruption: Secondary | ICD-10-CM | POA: Diagnosis not present

## 2014-11-22 DIAGNOSIS — Z5189 Encounter for other specified aftercare: Secondary | ICD-10-CM | POA: Diagnosis not present

## 2014-11-22 LAB — COMPREHENSIVE METABOLIC PANEL (CC13)
ALT: 27 U/L (ref 0–55)
ANION GAP: 7 meq/L (ref 3–11)
AST: 19 U/L (ref 5–34)
Albumin: 3.8 g/dL (ref 3.5–5.0)
Alkaline Phosphatase: 97 U/L (ref 40–150)
BUN: 13.1 mg/dL (ref 7.0–26.0)
CALCIUM: 9.4 mg/dL (ref 8.4–10.4)
CHLORIDE: 107 meq/L (ref 98–109)
CO2: 28 mEq/L (ref 22–29)
Creatinine: 0.7 mg/dL (ref 0.6–1.1)
Glucose: 87 mg/dl (ref 70–140)
POTASSIUM: 4.1 meq/L (ref 3.5–5.1)
Sodium: 142 mEq/L (ref 136–145)
Total Bilirubin: 0.21 mg/dL (ref 0.20–1.20)
Total Protein: 6.8 g/dL (ref 6.4–8.3)

## 2014-11-22 LAB — CBC WITH DIFFERENTIAL/PLATELET
BASO%: 2.2 % — ABNORMAL HIGH (ref 0.0–2.0)
Basophils Absolute: 0.2 10*3/uL — ABNORMAL HIGH (ref 0.0–0.1)
EOS ABS: 0.2 10*3/uL (ref 0.0–0.5)
EOS%: 2 % (ref 0.0–7.0)
HEMATOCRIT: 37.8 % (ref 34.8–46.6)
HGB: 12.8 g/dL (ref 11.6–15.9)
LYMPH%: 16.5 % (ref 14.0–49.7)
MCH: 30 pg (ref 25.1–34.0)
MCHC: 33.7 g/dL (ref 31.5–36.0)
MCV: 88.9 fL (ref 79.5–101.0)
MONO#: 1 10*3/uL — ABNORMAL HIGH (ref 0.1–0.9)
MONO%: 11.1 % (ref 0.0–14.0)
NEUT#: 6.2 10*3/uL (ref 1.5–6.5)
NEUT%: 68.2 % (ref 38.4–76.8)
PLATELETS: 224 10*3/uL (ref 145–400)
RBC: 4.26 10*6/uL (ref 3.70–5.45)
RDW: 16.2 % — AB (ref 11.2–14.5)
WBC: 9.1 10*3/uL (ref 3.9–10.3)
lymph#: 1.5 10*3/uL (ref 0.9–3.3)

## 2014-11-22 MED ORDER — PALONOSETRON HCL INJECTION 0.25 MG/5ML
INTRAVENOUS | Status: AC
  Filled 2014-11-22: qty 5

## 2014-11-22 MED ORDER — PALONOSETRON HCL INJECTION 0.25 MG/5ML
0.2500 mg | Freq: Once | INTRAVENOUS | Status: AC
  Administered 2014-11-22: 0.25 mg via INTRAVENOUS

## 2014-11-22 MED ORDER — DOXORUBICIN HCL CHEMO IV INJECTION 2 MG/ML
60.0000 mg/m2 | Freq: Once | INTRAVENOUS | Status: AC
  Administered 2014-11-22: 128 mg via INTRAVENOUS
  Filled 2014-11-22: qty 64

## 2014-11-22 MED ORDER — HEPARIN SOD (PORK) LOCK FLUSH 100 UNIT/ML IV SOLN
500.0000 [IU] | Freq: Once | INTRAVENOUS | Status: AC | PRN
  Administered 2014-11-22: 500 [IU]
  Filled 2014-11-22: qty 5

## 2014-11-22 MED ORDER — SODIUM CHLORIDE 0.9 % IV SOLN
600.0000 mg/m2 | Freq: Once | INTRAVENOUS | Status: AC
  Administered 2014-11-22: 1280 mg via INTRAVENOUS
  Filled 2014-11-22: qty 64

## 2014-11-22 MED ORDER — SODIUM CHLORIDE 0.9 % IV SOLN
Freq: Once | INTRAVENOUS | Status: AC
  Administered 2014-11-22: 10:00:00 via INTRAVENOUS
  Filled 2014-11-22: qty 5

## 2014-11-22 MED ORDER — SODIUM CHLORIDE 0.9 % IJ SOLN
10.0000 mL | INTRAMUSCULAR | Status: DC | PRN
  Administered 2014-11-22: 10 mL via INTRAVENOUS
  Filled 2014-11-22: qty 10

## 2014-11-22 MED ORDER — DOXYCYCLINE HYCLATE 100 MG PO TABS: 100.0000 mg | ORAL_TABLET | Freq: Two times a day (BID) | ORAL | Status: DC

## 2014-11-22 MED ORDER — SODIUM CHLORIDE 0.9 % IV SOLN
Freq: Once | INTRAVENOUS | Status: AC
  Administered 2014-11-22: 10:00:00 via INTRAVENOUS

## 2014-11-22 MED ORDER — PEGFILGRASTIM 6 MG/0.6ML ~~LOC~~ PSKT
6.0000 mg | PREFILLED_SYRINGE | Freq: Once | SUBCUTANEOUS | Status: AC
  Administered 2014-11-22: 6 mg via SUBCUTANEOUS
  Filled 2014-11-22: qty 0.6

## 2014-11-22 MED ORDER — SODIUM CHLORIDE 0.9 % IJ SOLN
10.0000 mL | INTRAMUSCULAR | Status: DC | PRN
Start: 2014-11-22 — End: 2014-11-22
  Administered 2014-11-22: 10 mL
  Filled 2014-11-22: qty 10

## 2014-11-22 NOTE — Patient Instructions (Signed)
[facility] Discharge Instructions for Patients Receiving Chemotherapy  Today you received the following chemotherapy agents:Adriamycin and Cytoxan   To help prevent nausea and vomiting after your treatment, we encourage you to take your nausea medication as directed.    If you develop nausea and vomiting that is not controlled by your nausea medication, call the clinic.   BELOW ARE SYMPTOMS THAT SHOULD BE REPORTED IMMEDIATELY:  *FEVER GREATER THAN 100.5 F  *CHILLS WITH OR WITHOUT FEVER  NAUSEA AND VOMITING THAT IS NOT CONTROLLED WITH YOUR NAUSEA MEDICATION  *UNUSUAL SHORTNESS OF BREATH  *UNUSUAL BRUISING OR BLEEDING  TENDERNESS IN MOUTH AND THROAT WITH OR WITHOUT PRESENCE OF ULCERS  *URINARY PROBLEMS  *BOWEL PROBLEMS  UNUSUAL RASH Items with * indicate a potential emergency and should be followed up as soon as possible.  Feel free to call the clinic you have any questions or concerns. The clinic phone number is [phone].  Please show the CHEMO ALERT CARD at check-in to the Emergency Department and triage nurse.   

## 2014-11-22 NOTE — Progress Notes (Signed)
Blood return noted before, every 5cc and after Adriamycin push  

## 2014-11-22 NOTE — Progress Notes (Signed)
Lori [name]  [phone] [phone]   ID: Lori [name] DOB: June 04, 1965  MR#: [id number]  EXB#:[id number]  Patient Care Team: Delilah Shan, MD as PCP - General (Family Medicine) [name], MD as Consulting Physician (General Surgery) [name], MD as Consulting Physician (Oncology) [name], MD as Attending Physician (Radiation Oncology) [name], RN as Registered Nurse [name], RN as Registered Nurse [name], NP as Nurse Practitioner (Nurse Practitioner) PCP: Lori Simmer, MD GYN: [name] MD OTHER MD:  CHIEF COMPLAINT: Estrogen receptor positive breast cancer  CURRENT TREATMENT: adjuvant chemotherapy  BREAST CANCER HISTORY: From the original intake note:  Lori [name] (who is the daughter of my former patient, Lori [name], herself now 10 years out from her T1 cN0 invasive ductal carcinoma, treated with anti-estrogens only) [facility] April 20 03/23/2014. Breast density was category C. A possible mass in the left breast upper outer quadrant was felt to warrant further evaluation, and on 07/04/2014 the patient underwent left mammography with tomosynthesis and left breast ultrasonography. At this point the patient felt she was able to palpate a mass in the area in question. Tomosynthesis did reveal an irregular mass in the upper outer left breast measuring 2.3 cm, associated on physical exam with a large area of thickening. Ultrasound of the area in question confirmed an irregular mass at the 1:00 position 5 cm from the nipple measuring 2.2 cm. The left axilla was negative sonographically.  Biopsy of the mass in question the same day, 07/04/2014, showed ([phone]) an invasive lobular breast cancer which was estrogen receptor 50% positive with strong staining intensity, progesterone receptor 1% positive with moderate staining intensity, with a proliferation  marker of 1%, and no HER-2 amplification, the signals ratio being 1.19 and the number per cell 1.55.  On 07/08/2014 the patient underwent bilateral breast MRI. This showed no abnormal adenopathy and no abnormality in the right breast. In the left breast however there was a 9.3 area of abnormal enhancement involving all quadrants. Within the upper outer quadrant there was a denser area which measured up to 5.1 cm.  The patient's subsequent history is as detailed below  INTERVAL HISTORY: Lori [name] returns today for follow-up of her breast cancer, alone. Today is day 1, cycle 4 of doxorubicin and cyclophosphamide, with neulasta given on day 2 for granulocyte support.   REVIEW OF SYSTEMS: Lori [name] is feeling better this week, although she had some random nausea this morning. Otherwise, she hasn't had much. She is using stool softeners and probiotics for her constipation and it is helping. She has not had to use her magic mouthwash lately, and her mouth tenderness is improving. She has a rash to her upper body that bothers her in a cosmetic sense. Her fatigue has subsided. She is having hot flashes day and night. She endorses anxiety and depression and wishes to speak with a counselor. A detailed review of systems is otherwise stable.    PAST MEDICAL HISTORY: Past Medical History  Diagnosis Date  . Asthma     last trimester of pregnancy 3 yrs ago-  never had problems since then, no meds  . Breast cancer   . Anxiety     PAST SURGICAL HISTORY: Past Surgical History  Procedure Laterality Date  . Cesarean section      x 2  . Knee arthroscopy Left 07/27/2013    Procedure: LEFT ARTHROSCOPY KNEE WITH MEDIAL MENISCAL DEBRIDEMENT;  Surgeon: Gearlean Alf, MD;  Location: [facility];  Service: Orthopedics;  Laterality: Left;  Marland Kitchen Mastectomy w/ sentinel node biopsy Left 08/10/2014  . Mastectomy w/ sentinel node biopsy Bilateral 08/10/2014    Procedure: MASTECTOMY WITH SENTINEL LYMPH NODE BIOPSY;  Surgeon: Autumn Messing  III, MD;  Location: [place];  Service: General;  Laterality: Bilateral;  . Portacath placement Right 09/07/2014    Procedure: INSERTION PORT-A-CATH;  Surgeon: [name], MD;  Location: [place];  Service: General;  Laterality: Right;    FAMILY HISTORY Family History  Problem Relation Age of Onset  . Breast cancer Mother 81  . Lymphoma Maternal Aunt 71  . Uterine cancer Paternal Aunt 58  . Uterine cancer Paternal Grandmother 4  . Ovarian cancer Other 54    mat great aunt through [facility]  . Ovarian cancer Maternal Grandmother 52  The patient's father died with congestive heart failure at the age of 26. The patient's mother is living, age 63. She was diagnosed with breast cancer at age 33. The patient's paternal grandmother was diagnosed with uterine cancer at age 28 and her daughter, the patient's paternal aunt, also with uterine cancer at age 48. On the mother's side one maternal aunt was diagnosed with lymphoma and the other with throat cancer. A maternal great aunt at age 41 was diagnosed with ovarian cancer.   GYNECOLOGIC HISTORY:  No LMP recorded.  menarche age 29, first live birth age 73, which the patient is aware double as the risk of breast cancer. She is GX P2. She still having regular periods. 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 Her husband Lori [name] (goes by "Lori [name]") works in Engineer, technical sales, although currently he is looking for work. Their children are Lori [name] 12 and Lori [name] 3. The patient is not a church attender    ADVANCED DIRECTIVES: Not in place   HEALTH MAINTENANCE: Social History  Substance Use Topics  . Smoking status: Former Smoker -- 1.00 packs/day for 8 years    Types: Cigarettes    Quit date: 07/20/2000  . Smokeless tobacco: Never Used  . Alcohol Use: Yes     Comment: socially     Colonoscopy:  PAP:  Bone density:  Lipid panel:  No Known Allergies  Current  Outpatient Prescriptions  Medication Sig Dispense Refill  . Docusate Sodium (DULCOLAX STOOL SOFTENER PO) Take 1 tablet by mouth daily.    Marland Kitchen doxycycline (VIBRA-TABS) 100 MG tablet Take 1 tablet (100 mg total) by mouth 2 (two) times daily. 30 tablet 3  . ibuprofen (ADVIL,MOTRIN) 200 MG tablet Take 600 mg by mouth every 6 (six) hours as needed.     . lidocaine-prilocaine (EMLA) cream Apply to affected area once 30 g 3  . loratadine (CLARITIN) 10 MG tablet Take 10 mg by mouth daily.    Marland Kitchen LORazepam (ATIVAN) 0.5 MG tablet Take 1 tablet (0.5 mg total) by mouth at bedtime. 30 tablet 0  . ondansetron (ZOFRAN) 8 MG tablet Take 1 tablet (8 mg total) by mouth 2 (two) times daily as needed. Start on the [name] day after chemotherapy. 30 tablet 1  . Probiotic Product (ALIGN PO) Take 1 capsule by mouth daily.    . prochlorperazine (COMPAZINE) 10 MG tablet Take 1 tablet (10 mg total) by mouth every 6 (six) hours as needed (Nausea or vomiting). 30 tablet 3  . valACYclovir (VALTREX) 500 MG tablet Take  1 tablet (500 mg total) by mouth 2 (two) times daily. (Patient not taking: Reported on 11/08/2014) 90 tablet 2   No current facility-administered medications for this visit.    OBJECTIVE: young White woman  In no acute distress  Filed Vitals:   11/22/14 0918  BP: 117/61  Pulse: 92  Temp: 97.9 F (36.6 C)  Resp: 18     Body mass index is 33.14 kg/(m^2).    ECOG FS:1 - Symptomatic but completely ambulatory   Skin: warm, dry, acneform rash to chest, upper arms, and face HEENT: sclerae anicteric, conjunctivae pink, oropharynx clear. No thrush or mucositis.  Lymph Nodes: No cervical or supraclavicular lymphadenopathy  Lungs: clear to auscultation bilaterally, no rales, wheezes, or rhonci  Heart: regular rate and rhythm  Abdomen: round, soft, non tender, positive bowel sounds  Musculoskeletal: No focal spinal tenderness, no peripheral edema  Neuro: non focal, well oriented, positive affect  Breasts:  deferred  LAB RESULTS:  CMP     Component Value Date/Time   NA 142 11/22/2014 0830   NA 139 08/03/2014 1022   K 4.1 11/22/2014 0830   K 4.1 08/03/2014 1022   CL 106 08/03/2014 1022   CO2 28 11/22/2014 0830   CO2 25 08/03/2014 1022   GLUCOSE 87 11/22/2014 0830   GLUCOSE 89 08/03/2014 1022   BUN 13.1 11/22/2014 0830   BUN 10 08/03/2014 1022   CREATININE 0.7 11/22/2014 0830   CREATININE 0.68 08/03/2014 1022   CALCIUM 9.4 11/22/2014 0830   CALCIUM 9.3 08/03/2014 1022   PROT 6.8 11/22/2014 0830   ALBUMIN 3.8 11/22/2014 0830   AST 19 11/22/2014 0830   ALT 27 11/22/2014 0830   ALKPHOS 97 11/22/2014 0830   BILITOT 0.21 11/22/2014 0830   GFRNONAA >60 08/03/2014 1022   GFRAA >60 08/03/2014 1022    INo results found for: SPEP, UPEP  Lab Results  Component Value Date   WBC 9.1 11/22/2014   NEUTROABS 6.2 11/22/2014   HGB 12.8 11/22/2014   HCT 37.8 11/22/2014   MCV 88.9 11/22/2014   PLT 224 11/22/2014      Chemistry      Component Value Date/Time   NA 142 11/22/2014 0830   NA 139 08/03/2014 1022   K 4.1 11/22/2014 0830   K 4.1 08/03/2014 1022   CL 106 08/03/2014 1022   CO2 28 11/22/2014 0830   CO2 25 08/03/2014 1022   BUN 13.1 11/22/2014 0830   BUN 10 08/03/2014 1022   CREATININE 0.7 11/22/2014 0830   CREATININE 0.68 08/03/2014 1022      Component Value Date/Time   CALCIUM 9.4 11/22/2014 0830   CALCIUM 9.3 08/03/2014 1022   ALKPHOS 97 11/22/2014 0830   AST 19 11/22/2014 0830   ALT 27 11/22/2014 0830   BILITOT 0.21 11/22/2014 0830       No results found for: LABCA2  No components found for: LABCA125  No results for input(s): INR in the last 168 hours.  Urinalysis No results found for: COLORURINE, APPEARANCEUR, LABSPEC, PHURINE, GLUCOSEU, HGBUR, BILIRUBINUR, KETONESUR, PROTEINUR, UROBILINOGEN, NITRITE, LEUKOCYTESUR  STUDIES: Ir Cv Line Injection  10/26/2014   CLINICAL DATA:  History of breast cancer, post surgically placed right subclavian vein  approach port a catheter on 09/17/2014, now with sluggish flow during attempted aspiration. Note, the patient has previously undergone a tPA dwell of the [facility] a Catheter.  Patient presents today for fluoroscopic guided Port a catheter injection.  EXAM: FLUOROSCOPIC GUIDED PORTA CATHETER INJECTION  COMPARISON:  Chest radiograph- 09/07/2014  CONTRAST:  20m OMNIPAQUE IOHEXOL 300 MG/ML  SOLN  FLUOROSCOPY TIME:  18 seconds (14 mGy)  TECHNIQUE: The Port a Catheter was sterilely accessed. The patient was positioned supine on the fluoroscopy table.  A preprocedural spot fluoroscopic image was obtained of the right upper chest and existing right subclavian vein approach port a catheter.  Multiple angiographic images were obtained following the injection of a small amount of contrast.  FINDINGS: Preprocedural spot fluoroscopic image demonstrates unchanged and appropriate positioning of the right subclavian vein approach port a catheter with tip terminating over the distal SVC. There is no evidence of catheter tubing kink or fracture.  Contrast injection demonstrates wide patency of the Port a catheter without evidence of contrast extravasation or a fibrin sheath formation about the Port a catheter's tip.  Note, the Port a Catheter was noted to easily aspirate and flush.  IMPRESSION: Appropriate position functioning right subclavian vein approach port a catheter. The Port a catheter is ready for immediate use.   Electronically Signed   By: JSandi MariscalM.D.   On: 10/26/2014 12:03    ASSESSMENT: 49y.o. BRCA negative [place] woman status post left breast biopsy 07/04/2014 for a clinical T3 N0, stage IIA invasive lobular breast cancer, grade 2, estrogen receptor positive, progesterone receptor 1% "positive", with an MIB-1 of less than 5% and no HER-2 amplification  (1) genetics testing 07/29/2014 through the [employer] gene panel offered by [facility]found no deleterious mutations in ATM, BARD1, BRCA1, BRCA2, BRIP1,  CDH1, CHEK2, EPCAM, MLH1, MRE11A, MSH2, MSH6, MUTYH, NBN, NF1, PALB2, PMS2, PTEN, RAD50, RAD51C, RAD51D, SMARCA4, STK11, or TP53.   (2) status post bilateral mastectomies 08/10/2014 showing  (a) on the right, lobular carcinoma in situ  (b) on the left, a pT3 pN1, stage IIIA invasive lobular breast cancer, HER-2 repeated  (3) chemotherapy to start 09/27/2014,l consisting of standard doxorubicin and cyclophosphamide in dose dense fashion 4, to be followed by paclitaxel weekly 12  (4) radiation to follow chemotherapy   (5) tamoxifen was started neoadjuvantly on 07/13/2014 given the likely delay in definitive surgery while genetics results are pending; it is being held during chemotherapy   PLAN: RJoviis feeling improved today. The labs were reviewed in detail and were stable. She will proceed with her 4th and final cycle of doxorubicin and cyclophosphamide as planned.   I have sent in a prescription for doxycycline for her body acne. We discussed venlafaxine for her hot flashes and situational anxiety and depression, but she wants some time to think on it. In the meantime, I will send a chaplain to speak with her.  RCongettawill return in 1 week for labs and a nadir visit. She understands and agree with this plan. She knows the goal of treatment in her case is cure. She has been encouraged to call with any issues that might arise before her next visit here.    HLaurie Panda NP   11/22/2014 10:03 AM

## 2014-11-22 NOTE — Progress Notes (Signed)
Pt reported to flush to have port accessed and labs drawn. Port was accessed, flushed with ease and blood return noted. When attempting to draw labs blood did not return, port would not flush. Port de accessed. Pt lab was drawn peripherally. Treatment nurse notified of port complication and advised it would be re-attempted for treatment.

## 2014-11-22 NOTE — Patient Instructions (Signed)

## 2014-11-29 ENCOUNTER — Encounter: Payer: Self-pay | Admitting: Nurse Practitioner

## 2014-11-29 ENCOUNTER — Other Ambulatory Visit: Payer: Self-pay | Admitting: Nurse Practitioner

## 2014-11-29 ENCOUNTER — Ambulatory Visit (HOSPITAL_BASED_OUTPATIENT_CLINIC_OR_DEPARTMENT_OTHER): Payer: BLUE CROSS/BLUE SHIELD | Admitting: Nurse Practitioner

## 2014-11-29 ENCOUNTER — Other Ambulatory Visit (HOSPITAL_BASED_OUTPATIENT_CLINIC_OR_DEPARTMENT_OTHER): Payer: BLUE CROSS/BLUE SHIELD

## 2014-11-29 VITALS — BP 113/59 | HR 113 | Temp 98.4°F | Resp 20 | Ht 68.0 in | Wt 215.5 lb

## 2014-11-29 DIAGNOSIS — Z170 Estrogen receptor positive status [ER+]: Secondary | ICD-10-CM | POA: Diagnosis not present

## 2014-11-29 DIAGNOSIS — F32A Depression, unspecified: Secondary | ICD-10-CM

## 2014-11-29 DIAGNOSIS — C50412 Malignant neoplasm of upper-outer quadrant of left female breast: Secondary | ICD-10-CM

## 2014-11-29 DIAGNOSIS — Z79810 Long term (current) use of selective estrogen receptor modulators (SERMs): Secondary | ICD-10-CM

## 2014-11-29 DIAGNOSIS — D0501 Lobular carcinoma in situ of right breast: Secondary | ICD-10-CM

## 2014-11-29 DIAGNOSIS — F329 Major depressive disorder, single episode, unspecified: Secondary | ICD-10-CM

## 2014-11-29 LAB — CBC WITH DIFFERENTIAL/PLATELET
BASO%: 2.6 % — ABNORMAL HIGH (ref 0.0–2.0)
BASOS ABS: 0.1 10*3/uL (ref 0.0–0.1)
EOS ABS: 0.1 10*3/uL (ref 0.0–0.5)
EOS%: 1.3 % (ref 0.0–7.0)
HCT: 36.2 % (ref 34.8–46.6)
HGB: 12.2 g/dL (ref 11.6–15.9)
LYMPH%: 18.7 % (ref 14.0–49.7)
MCH: 29.8 pg (ref 25.1–34.0)
MCHC: 33.6 g/dL (ref 31.5–36.0)
MCV: 88.6 fL (ref 79.5–101.0)
MONO#: 0.2 10*3/uL (ref 0.1–0.9)
MONO%: 5 % (ref 0.0–14.0)
NEUT#: 3 10*3/uL (ref 1.5–6.5)
NEUT%: 72.4 % (ref 38.4–76.8)
PLATELETS: 210 10*3/uL (ref 145–400)
RBC: 4.08 10*6/uL (ref 3.70–5.45)
RDW: 15.8 % — ABNORMAL HIGH (ref 11.2–14.5)
WBC: 4.1 10*3/uL (ref 3.9–10.3)
lymph#: 0.8 10*3/uL — ABNORMAL LOW (ref 0.9–3.3)

## 2014-11-29 LAB — COMPREHENSIVE METABOLIC PANEL (CC13)
ALBUMIN: 3.7 g/dL (ref 3.5–5.0)
ALK PHOS: 145 U/L (ref 40–150)
ALT: 15 U/L (ref 0–55)
AST: 13 U/L (ref 5–34)
Anion Gap: 6 mEq/L (ref 3–11)
BUN: 12.5 mg/dL (ref 7.0–26.0)
CO2: 27 mEq/L (ref 22–29)
CREATININE: 0.7 mg/dL (ref 0.6–1.1)
Calcium: 9.1 mg/dL (ref 8.4–10.4)
Chloride: 105 mEq/L (ref 98–109)
GLUCOSE: 111 mg/dL (ref 70–140)
Potassium: 3.7 mEq/L (ref 3.5–5.1)
SODIUM: 139 meq/L (ref 136–145)
TOTAL PROTEIN: 6.8 g/dL (ref 6.4–8.3)
Total Bilirubin: 0.4 mg/dL (ref 0.20–1.20)

## 2014-11-29 MED ORDER — VENLAFAXINE HCL ER 75 MG PO CP24: 75.0000 mg | ORAL_CAPSULE | Freq: Every day | ORAL | Status: DC

## 2014-11-29 NOTE — Progress Notes (Signed)
[place]  [phone]) [phone] [phone]) [phone]   ID: Lori [name] DOB: [birth year]  MR#: [id number]  CHY#:[id number]  Patient Care Team: Delilah Shan, MD as PCP - General (Family Medicine) [name], MD as Consulting Physician (General Surgery) [name], MD as Consulting Physician (Oncology) [name], MD as Attending Physician (Radiation Oncology) [name], RN as Registered Nurse [name], RN as Registered Nurse [name], NP as Nurse Practitioner (Nurse Practitioner) PCP: [name], MD GYN: [name] MD OTHER MD:  CHIEF COMPLAINT: Estrogen receptor positive breast cancer  CURRENT TREATMENT: adjuvant chemotherapy  BREAST CANCER HISTORY: From the original intake note:  Lori [name] (who is the daughter of my former patient, [name], herself now 10 years out from her T1 cN0 invasive ductal carcinoma, treated with anti-estrogens only) [facility] April 20 03/23/2014. Breast density was category C. A possible mass in the left breast upper outer quadrant was felt to warrant further evaluation, and on 07/04/2014 the patient underwent left mammography with tomosynthesis and left breast ultrasonography. At this point the patient felt she was able to palpate a mass in the area in question. Tomosynthesis did reveal an irregular mass in the upper outer left breast measuring 2.3 cm, associated on physical exam with a large area of thickening. Ultrasound of the area in question confirmed an irregular mass at the 1:00 position 5 cm from the nipple measuring 2.2 cm. The left axilla was negative sonographically.  Biopsy of the mass in question the same day, 07/04/2014, showed (SAA [phone]) an invasive lobular breast cancer which was estrogen receptor 50% positive with strong staining intensity, progesterone receptor 1% positive with moderate staining intensity, with a proliferation  marker of 1%, and no HER-2 amplification, the signals ratio being 1.19 and the number per cell 1.55.  On 07/08/2014 the patient underwent bilateral breast MRI. This showed no abnormal adenopathy and no abnormality in the right breast. In the left breast however there was a 9.3 area of abnormal enhancement involving all quadrants. Within the upper outer quadrant there was a denser area which measured up to 5.1 cm.  The patient's subsequent history is as detailed below  INTERVAL HISTORY: Lori [name] returns today for follow-up of her breast cancer, alone. Today is day 8, cycle 4 of doxorubicin and cyclophosphamide, with neulasta given on day 2 for granulocyte support.   REVIEW OF SYSTEMS: Lori [name] denies fevers or chills. Her nausea is minimal. She continues to battle constipation. While she is moving her bowels daily, they tend to be stiff. She is using miralax in addition to colace. She is eating and drinking well. She denies mouth sores and has not had to use her magic mouthwash lately. She never started the doxycycline for her rash, but it is no worse. Her main complaint is fatigue. She is having crying spells off and on. She endorses anxiety and depression, and has been speaking with our chaplain. A detailed review of systems is otherwise stable.    PAST MEDICAL HISTORY: Past Medical History  Diagnosis Date  . Asthma     last trimester of pregnancy 3 yrs ago-  never had problems since then, no meds  . Breast cancer   . Anxiety     PAST SURGICAL HISTORY: Past Surgical History  Procedure Laterality Date  . Cesarean section      x 2  . Knee arthroscopy Left 07/27/2013    Procedure: LEFT ARTHROSCOPY KNEE  WITH MEDIAL MENISCAL DEBRIDEMENT;  Surgeon: Gearlean Alf, MD;  Location: [facility];  Service: Orthopedics;  Laterality: Left;  Marland Kitchen Mastectomy w/ sentinel node biopsy Left 08/10/2014  . Mastectomy w/ sentinel node biopsy Bilateral 08/10/2014    Procedure: MASTECTOMY WITH SENTINEL LYMPH NODE BIOPSY;   Surgeon: [name], MD;  Location: [place];  Service: General;  Laterality: Bilateral;  . Portacath placement Right 09/07/2014    Procedure: INSERTION PORT-A-CATH;  Surgeon: [name], MD;  Location: [place];  Service: General;  Laterality: Right;    FAMILY HISTORY Family History  Problem Relation Age of Onset  . Breast cancer Mother 24  . Lymphoma Maternal Aunt 71  . Uterine cancer Paternal Aunt 48  . Uterine cancer Paternal Grandmother 26  . Ovarian cancer Other 3    mat great aunt through [facility]  . Ovarian cancer Maternal Grandmother 53  The patient's father died with congestive heart failure at the age of 43. The patient's mother is living, age 39. She was diagnosed with breast cancer at age 71. The patient's paternal grandmother was diagnosed with uterine cancer at age 35 and her daughter, the patient's paternal aunt, also with uterine cancer at age 57. On the mother's side one maternal aunt was diagnosed with lymphoma and the other with throat cancer. A maternal great aunt at age 15 was diagnosed with ovarian cancer.   GYNECOLOGIC HISTORY:  No LMP recorded.  menarche age 69, first live birth age 49, which the patient is aware double as the risk of breast cancer. She is GX P2. She still having regular periods. 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 Her husband SHAWNELL DYKES the third (goes by "[name]") works in Engineer, technical sales, although currently he is looking for work. Their children are [name] 12 and [name] 3. The patient is not a church attender    ADVANCED DIRECTIVES: Not in place   HEALTH MAINTENANCE: Social History  Substance Use Topics  . Smoking status: Former Smoker -- 1.00 packs/day for 8 years    Types: Cigarettes    Quit date: 07/20/2000  . Smokeless tobacco: Never Used  . Alcohol Use: Yes     Comment: socially     Colonoscopy:  PAP:  Bone density:  Lipid panel:  No Known  Allergies  Current Outpatient Prescriptions  Medication Sig Dispense Refill  . Docusate Sodium (DULCOLAX STOOL SOFTENER PO) Take 1 tablet by mouth daily.    Marland Kitchen ibuprofen (ADVIL,MOTRIN) 200 MG tablet Take 600 mg by mouth every 6 (six) hours as needed.     . lidocaine-prilocaine (EMLA) cream Apply to affected area once 30 g 3  . loratadine (CLARITIN) 10 MG tablet Take 10 mg by mouth daily.    Marland Kitchen LORazepam (ATIVAN) 0.5 MG tablet Take 1 tablet (0.5 mg total) by mouth at bedtime. 30 tablet 0  . ondansetron (ZOFRAN) 8 MG tablet Take 1 tablet (8 mg total) by mouth 2 (two) times daily as needed. Start on the third day after chemotherapy. 30 tablet 1  . Probiotic Product (ALIGN PO) Take 1 capsule by mouth daily.    Marland Kitchen doxycycline (VIBRA-TABS) 100 MG tablet Take 1 tablet (100 mg total) by mouth 2 (two) times daily. (Patient not taking: Reported on 11/29/2014) 30 tablet 3  . prochlorperazine (COMPAZINE) 10 MG tablet Take 1 tablet (10 mg total) by mouth every 6 (six) hours as needed (Nausea or vomiting). (  Patient not taking: Reported on 11/29/2014) 30 tablet 3  . valACYclovir (VALTREX) 500 MG tablet Take 1 tablet (500 mg total) by mouth 2 (two) times daily. (Patient not taking: Reported on 11/08/2014) 90 tablet 2  . venlafaxine XR (EFFEXOR-XR) 75 MG 24 hr capsule Take 1 capsule (75 mg total) by mouth daily with breakfast. 90 capsule 3   No current facility-administered medications for this visit.    OBJECTIVE: young White woman  In no acute distress  Filed Vitals:   11/29/14 1119  BP: 113/59  Pulse: 113  Temp: 98.4 F (36.9 C)  Resp: 20     Body mass index is 32.77 kg/(m^2).    ECOG FS:1 - Symptomatic but completely ambulatory   Skin: warm, dry, acneform rash to chest, upper arms, and face HEENT: sclerae anicteric, conjunctivae pink, oropharynx clear. No thrush or mucositis.  Lymph Nodes: No cervical or supraclavicular lymphadenopathy  Lungs: clear to auscultation bilaterally, no rales, wheezes, or  rhonci  Heart: regular rate and rhythm  Abdomen: round, soft, non tender, positive bowel sounds  Musculoskeletal: No focal spinal tenderness, no peripheral edema  Neuro: non focal, well oriented, tearful affect  Breasts: deferred  LAB RESULTS:  CMP     Component Value Date/Time   NA 139 11/29/2014 1048   NA 139 08/03/2014 1022   K 3.7 11/29/2014 1048   K 4.1 08/03/2014 1022   CL 106 08/03/2014 1022   CO2 27 11/29/2014 1048   CO2 25 08/03/2014 1022   GLUCOSE 111 11/29/2014 1048   GLUCOSE 89 08/03/2014 1022   BUN 12.5 11/29/2014 1048   BUN 10 08/03/2014 1022   CREATININE 0.7 11/29/2014 1048   CREATININE 0.68 08/03/2014 1022   CALCIUM 9.1 11/29/2014 1048   CALCIUM 9.3 08/03/2014 1022   PROT 6.8 11/29/2014 1048   ALBUMIN 3.7 11/29/2014 1048   AST 13 11/29/2014 1048   ALT 15 11/29/2014 1048   ALKPHOS 145 11/29/2014 1048   BILITOT 0.40 11/29/2014 1048   GFRNONAA >60 08/03/2014 1022   GFRAA >60 08/03/2014 1022    INo results found for: SPEP, UPEP  Lab Results  Component Value Date   WBC 4.1 11/29/2014   NEUTROABS 3.0 11/29/2014   HGB 12.2 11/29/2014   HCT 36.2 11/29/2014   MCV 88.6 11/29/2014   PLT 210 11/29/2014      Chemistry      Component Value Date/Time   NA 139 11/29/2014 1048   NA 139 08/03/2014 1022   K 3.7 11/29/2014 1048   K 4.1 08/03/2014 1022   CL 106 08/03/2014 1022   CO2 27 11/29/2014 1048   CO2 25 08/03/2014 1022   BUN 12.5 11/29/2014 1048   BUN 10 08/03/2014 1022   CREATININE 0.7 11/29/2014 1048   CREATININE 0.68 08/03/2014 1022      Component Value Date/Time   CALCIUM 9.1 11/29/2014 1048   CALCIUM 9.3 08/03/2014 1022   ALKPHOS 145 11/29/2014 1048   AST 13 11/29/2014 1048   ALT 15 11/29/2014 1048   BILITOT 0.40 11/29/2014 1048       No results found for: LABCA2  No components found for: LABCA125  No results for input(s): INR in the last 168 hours.  Urinalysis No results found for: COLORURINE, APPEARANCEUR, LABSPEC, PHURINE,  GLUCOSEU, HGBUR, BILIRUBINUR, KETONESUR, PROTEINUR, UROBILINOGEN, NITRITE, LEUKOCYTESUR  STUDIES: No results found.  ASSESSMENT: 49 y.o. BRCA negative [place] woman status post left breast biopsy 07/04/2014 for a clinical T3 N0, stage IIA invasive lobular breast  cancer, grade 2, estrogen receptor positive, progesterone receptor 1% "positive", with an MIB-1 of less than 5% and no HER-2 amplification  (1) genetics testing 07/29/2014 through the [employer] gene panel offered by [facility] found no deleterious mutations in ATM, BARD1, BRCA1, BRCA2, BRIP1, CDH1, CHEK2, EPCAM, MLH1, MRE11A, MSH2, MSH6, MUTYH, NBN, NF1, PALB2, PMS2, PTEN, RAD50, RAD51C, RAD51D, SMARCA4, STK11, or TP53.   (2) status post bilateral mastectomies 08/10/2014 showing  (a) on the right, lobular carcinoma in situ  (b) on the left, a pT3 pN1, stage IIIA invasive lobular breast cancer, HER-2 repeated  (3) chemotherapy to start 09/27/2014,l consisting of standard doxorubicin and cyclophosphamide in dose dense fashion 4, to be followed by paclitaxel weekly 12  (4) radiation to follow chemotherapy   (5) tamoxifen was started neoadjuvantly on 07/13/2014 given the likely delay in definitive surgery while genetics results are pending; it is being held during chemotherapy   PLAN: Lori [name] is tearful today. She is ready to try an antidepressant at this time. Last week we discussed venlafaxine for hot flashes, and her current anxiety and depression. I have sent a prescription for 31m daily to her pharmacy. She understands that this drug will take at least 2 weeks to build up in her system. Similarly, if she were to ever choose to stop this drug, she must wean herself off slowing.   Looking forward to her next chemo regimen, paclitaxel weekly x12, Lori [name] expressed concerns about the potential for peripheral neuropathy. She knows people who have had to stop treatment early because of this side effect, and she wonders if that will  happen to her. I explained that, caught early enough, some neuropathy symptoms are completely reversible. We will ask her at every visit about whether she has experienced any numbness or tingling prior to the start of each cycle, in an effort to catch it when it first appears. I also explained that we could take a week off from treatment at any point, if more time is needed to assess symptoms. Even if she had to stop the paclitaxel, she has antiestrogen therapy following and this will be the "headliner" as far as any of her treatments are concerned.  RNaiwill return in 1 week for a follow up visit with Dr. MJana Hakim She understands and agree with this plan. She knows the goal of treatment in her case is cure. She has been encouraged to call with any issues that might arise before her next visit here.    HLaurie Panda NP   11/29/2014 2:28 PM

## 2014-12-06 ENCOUNTER — Ambulatory Visit (HOSPITAL_BASED_OUTPATIENT_CLINIC_OR_DEPARTMENT_OTHER): Payer: BLUE CROSS/BLUE SHIELD | Admitting: Oncology

## 2014-12-06 ENCOUNTER — Other Ambulatory Visit: Payer: Self-pay | Admitting: Nurse Practitioner

## 2014-12-06 ENCOUNTER — Telehealth: Payer: Self-pay | Admitting: Oncology

## 2014-12-06 ENCOUNTER — Other Ambulatory Visit: Payer: BLUE CROSS/BLUE SHIELD

## 2014-12-06 ENCOUNTER — Ambulatory Visit: Payer: BLUE CROSS/BLUE SHIELD

## 2014-12-06 ENCOUNTER — Encounter: Payer: Self-pay | Admitting: *Deleted

## 2014-12-06 ENCOUNTER — Other Ambulatory Visit (HOSPITAL_BASED_OUTPATIENT_CLINIC_OR_DEPARTMENT_OTHER): Payer: BLUE CROSS/BLUE SHIELD | Admitting: *Deleted

## 2014-12-06 ENCOUNTER — Ambulatory Visit (HOSPITAL_BASED_OUTPATIENT_CLINIC_OR_DEPARTMENT_OTHER): Payer: BLUE CROSS/BLUE SHIELD

## 2014-12-06 ENCOUNTER — Other Ambulatory Visit (HOSPITAL_BASED_OUTPATIENT_CLINIC_OR_DEPARTMENT_OTHER): Payer: BLUE CROSS/BLUE SHIELD

## 2014-12-06 VITALS — BP 95/51 | HR 97 | Temp 98.5°F | Resp 18

## 2014-12-06 VITALS — BP 112/64 | HR 86 | Temp 98.1°F | Resp 18 | Ht 68.0 in | Wt 218.1 lb

## 2014-12-06 DIAGNOSIS — C50412 Malignant neoplasm of upper-outer quadrant of left female breast: Secondary | ICD-10-CM | POA: Diagnosis not present

## 2014-12-06 DIAGNOSIS — Z95828 Presence of other vascular implants and grafts: Secondary | ICD-10-CM

## 2014-12-06 DIAGNOSIS — Z5111 Encounter for antineoplastic chemotherapy: Secondary | ICD-10-CM | POA: Diagnosis not present

## 2014-12-06 DIAGNOSIS — D0501 Lobular carcinoma in situ of right breast: Secondary | ICD-10-CM

## 2014-12-06 DIAGNOSIS — Z170 Estrogen receptor positive status [ER+]: Secondary | ICD-10-CM

## 2014-12-06 DIAGNOSIS — Z452 Encounter for adjustment and management of vascular access device: Secondary | ICD-10-CM | POA: Diagnosis not present

## 2014-12-06 DIAGNOSIS — C50919 Malignant neoplasm of unspecified site of unspecified female breast: Secondary | ICD-10-CM

## 2014-12-06 LAB — COMPREHENSIVE METABOLIC PANEL (CC13)
ALBUMIN: 3.7 g/dL (ref 3.5–5.0)
ALK PHOS: 102 U/L (ref 40–150)
ALT: 17 U/L (ref 0–55)
ANION GAP: 7 meq/L (ref 3–11)
AST: 13 U/L (ref 5–34)
BUN: 10.4 mg/dL (ref 7.0–26.0)
CALCIUM: 9.3 mg/dL (ref 8.4–10.4)
CO2: 27 mEq/L (ref 22–29)
Chloride: 108 mEq/L (ref 98–109)
Creatinine: 0.7 mg/dL (ref 0.6–1.1)
Glucose: 96 mg/dl (ref 70–140)
POTASSIUM: 4 meq/L (ref 3.5–5.1)
Sodium: 142 mEq/L (ref 136–145)
Total Bilirubin: <0.3 mg/dL (ref 0.20–1.20)
Total Protein: 6.8 g/dL (ref 6.4–8.3)

## 2014-12-06 LAB — CBC WITH DIFFERENTIAL/PLATELET
BASO%: 1.6 % (ref 0.0–2.0)
BASOS ABS: 0.1 10*3/uL (ref 0.0–0.1)
EOS ABS: 0.2 10*3/uL (ref 0.0–0.5)
EOS%: 2.1 % (ref 0.0–7.0)
HEMATOCRIT: 38.8 % (ref 34.8–46.6)
HEMOGLOBIN: 13.1 g/dL (ref 11.6–15.9)
LYMPH#: 1.4 10*3/uL (ref 0.9–3.3)
LYMPH%: 17 % (ref 14.0–49.7)
MCH: 30 pg (ref 25.1–34.0)
MCHC: 33.8 g/dL (ref 31.5–36.0)
MCV: 88.7 fL (ref 79.5–101.0)
MONO#: 1.1 10*3/uL — ABNORMAL HIGH (ref 0.1–0.9)
MONO%: 14.4 % — ABNORMAL HIGH (ref 0.0–14.0)
NEUT#: 5.2 10*3/uL (ref 1.5–6.5)
NEUT%: 64.9 % (ref 38.4–76.8)
PLATELETS: 213 10*3/uL (ref 145–400)
RBC: 4.37 10*6/uL (ref 3.70–5.45)
RDW: 16.9 % — AB (ref 11.2–14.5)
WBC: 8 10*3/uL (ref 3.9–10.3)

## 2014-12-06 MED ORDER — FAMOTIDINE IN NACL 20-0.9 MG/50ML-% IV SOLN
20.0000 mg | Freq: Once | INTRAVENOUS | Status: AC
  Administered 2014-12-06: 20 mg via INTRAVENOUS

## 2014-12-06 MED ORDER — DIPHENHYDRAMINE HCL 50 MG/ML IJ SOLN
25.0000 mg | Freq: Once | INTRAMUSCULAR | Status: AC
  Administered 2014-12-06: 25 mg via INTRAVENOUS

## 2014-12-06 MED ORDER — PACLITAXEL CHEMO INJECTION 300 MG/50ML
80.0000 mg/m2 | Freq: Once | INTRAVENOUS | Status: AC
  Administered 2014-12-06: 174 mg via INTRAVENOUS
  Filled 2014-12-06: qty 29

## 2014-12-06 MED ORDER — SODIUM CHLORIDE 0.9 % IV SOLN
Freq: Once | INTRAVENOUS | Status: AC
  Administered 2014-12-06: 12:00:00 via INTRAVENOUS
  Filled 2014-12-06: qty 4

## 2014-12-06 MED ORDER — SODIUM CHLORIDE 0.9 % IJ SOLN
10.0000 mL | INTRAMUSCULAR | Status: DC | PRN
  Administered 2014-12-06: 10 mL
  Filled 2014-12-06: qty 10

## 2014-12-06 MED ORDER — SODIUM CHLORIDE 0.9 % IJ SOLN
10.0000 mL | INTRAMUSCULAR | Status: DC | PRN
  Administered 2014-12-06: 10 mL via INTRAVENOUS
  Filled 2014-12-06: qty 10

## 2014-12-06 MED ORDER — LORAZEPAM 2 MG/ML IJ SOLN
INTRAMUSCULAR | Status: AC
Start: 2014-12-06 — End: 2014-12-06
  Filled 2014-12-06: qty 1

## 2014-12-06 MED ORDER — ALTEPLASE 2 MG IJ SOLR
2.0000 mg | Freq: Once | INTRAMUSCULAR | Status: AC
  Administered 2014-12-06: 2 mg
  Filled 2014-12-06: qty 2

## 2014-12-06 MED ORDER — HEPARIN SOD (PORK) LOCK FLUSH 100 UNIT/ML IV SOLN
500.0000 [IU] | Freq: Once | INTRAVENOUS | Status: AC | PRN
  Administered 2014-12-06: 500 [IU]
  Filled 2014-12-06: qty 5

## 2014-12-06 MED ORDER — DIPHENHYDRAMINE HCL 50 MG/ML IJ SOLN
INTRAMUSCULAR | Status: AC
  Filled 2014-12-06: qty 1

## 2014-12-06 MED ORDER — SODIUM CHLORIDE 0.9 % IV SOLN
Freq: Once | INTRAVENOUS | Status: AC
  Administered 2014-12-06: 11:00:00 via INTRAVENOUS

## 2014-12-06 MED ORDER — FAMOTIDINE IN NACL 20-0.9 MG/50ML-% IV SOLN
INTRAVENOUS | Status: AC
  Filled 2014-12-06: qty 50

## 2014-12-06 MED ORDER — LORAZEPAM 2 MG/ML IJ SOLN
0.5000 mg | Freq: Once | INTRAMUSCULAR | Status: AC
  Administered 2014-12-06: 0.5 mg via INTRAVENOUS

## 2014-12-06 NOTE — Patient Instructions (Signed)
[facility] Discharge Instructions for Patients Receiving Chemotherapy  Today you received the following chemotherapy agents Paclitaxel.   To help prevent nausea and vomiting after your treatment, we encourage you to take your nausea medication as directed.    If you develop nausea and vomiting that is not controlled by your nausea medication, call the clinic.   BELOW ARE SYMPTOMS THAT SHOULD BE REPORTED IMMEDIATELY:  *FEVER GREATER THAN 100.5 F  *CHILLS WITH OR WITHOUT FEVER  NAUSEA AND VOMITING THAT IS NOT CONTROLLED WITH YOUR NAUSEA MEDICATION  *UNUSUAL SHORTNESS OF BREATH  *UNUSUAL BRUISING OR BLEEDING  TENDERNESS IN MOUTH AND THROAT WITH OR WITHOUT PRESENCE OF ULCERS  *URINARY PROBLEMS  *BOWEL PROBLEMS  UNUSUAL RASH Items with * indicate a potential emergency and should be followed up as soon as possible.  Feel free to call the clinic you have any questions or concerns. The clinic phone number is [phone].  Please show the CHEMO ALERT CARD at check-in to the Emergency Department and triage nurse.   

## 2014-12-06 NOTE — Patient Instructions (Signed)

## 2014-12-06 NOTE — Telephone Encounter (Signed)
Added appt per pof for NOV...the patient will get new sched in chemo/nxt OCT visit

## 2014-12-06 NOTE — Progress Notes (Signed)
[place]  [phone]) [phone] Fax:(336) [phone]   ID: [name] DOB: 04-26-1965  MR#: [id number]  ZSW#:[id number]  Patient Care Team: Delilah Shan, MD as PCP - General (Family Medicine) [name], MD as Consulting Physician (General Surgery) [name], MD as Consulting Physician (Oncology) [name], MD as Attending Physician (Radiation Oncology) [name], RN as Registered Nurse [name], RN as Registered Nurse [name], NP as Nurse Practitioner (Nurse Practitioner) PCP: [name], MD GYN: [name] MD OTHER MD:  CHIEF COMPLAINT: Estrogen receptor positive breast cancer  CURRENT TREATMENT: adjuvant chemotherapy  BREAST CANCER HISTORY: From the original intake note:  Lori [name] (who is the daughter of my former patient, Lori [name], herself now 10 years out from her T1 cN0 invasive ductal carcinoma, treated with anti-estrogens only) [facility] April 20 03/23/2014. Breast density was category C. A possible mass in the left breast upper outer quadrant was felt to warrant further evaluation, and on 07/04/2014 the patient underwent left mammography with tomosynthesis and left breast ultrasonography. At this point the patient felt she was able to palpate a mass in the area in question. Tomosynthesis did reveal an irregular mass in the upper outer left breast measuring 2.3 cm, associated on physical exam with a large area of thickening. Ultrasound of the area in question confirmed an irregular mass at the 1:00 position 5 cm from the nipple measuring 2.2 cm. The left axilla was negative sonographically.  Biopsy of the mass in question the same day, 07/04/2014, showed ([id number] [phone]) an invasive lobular breast cancer which was estrogen receptor 50% positive with strong staining intensity, progesterone receptor 1% positive with moderate staining intensity, with a proliferation  marker of 1%, and no HER-2 amplification, the signals ratio being 1.19 and the number per cell 1.55.  On 07/08/2014 the patient underwent bilateral breast MRI. This showed no abnormal adenopathy and no abnormality in the right breast. In the left breast however there was a 9.3 area of abnormal enhancement involving all quadrants. Within the upper outer quadrant there was a denser area which measured up to 5.1 cm.  The patient's subsequent history is as detailed below  INTERVAL HISTORY: Lori [name] returns today for follow-up of her breast cancer accompanied by her husband trey. Today is day 1, cycle 1 of 12 planned cycles of weekly paclitaxel  REVIEW OF SYSTEMS: Lori [name] had a tough time with her first 4 cycles of chemotherapy and so she is anxious about the new treatment she is about to receive. The very worst treatment was the first one. She was "out" for 2 or 3 days, lying in the upstairs bedroom and not really interacting with the family. She had palpitations with the steroids which alarmed her so the steroids for anti-nausea were cut back significantly. She's had problems with her port and she is receiving TPA for that today. She had mouth sores with the first cycle, was started on Valtrex, and then became significantly constipated. Emotionally she feels overwhelmed. She has been prescribed venlafaxine but she has not started it. She also has had acne problems. She has been on doxycycline for that but again she prefers not to take medication unless absolutely necessary. A detailed review of systems today was otherwise stable  PAST MEDICAL HISTORY: Past Medical History  Diagnosis Date  . Asthma     last trimester of pregnancy 3 yrs ago-  never had problems since then, no meds  .  Breast cancer   . Anxiety     PAST SURGICAL HISTORY: Past Surgical History  Procedure Laterality Date  . Cesarean section      x 2  . Knee arthroscopy Left 07/27/2013    Procedure: LEFT ARTHROSCOPY KNEE WITH MEDIAL  MENISCAL DEBRIDEMENT;  Surgeon: Gearlean Alf, MD;  Location: [facility];  Service: Orthopedics;  Laterality: Left;  Marland Kitchen Mastectomy w/ sentinel node biopsy Left 08/10/2014  . Mastectomy w/ sentinel node biopsy Bilateral 08/10/2014    Procedure: MASTECTOMY WITH SENTINEL LYMPH NODE BIOPSY;  Surgeon: [name], MD;  Location: [place];  Service: General;  Laterality: Bilateral;  . Portacath placement Right 09/07/2014    Procedure: INSERTION PORT-A-CATH;  Surgeon: [name], MD;  Location: [place];  Service: General;  Laterality: Right;    FAMILY HISTORY Family History  Problem Relation Age of Onset  . Breast cancer Mother 68  . Lymphoma Maternal Aunt 71  . Uterine cancer Paternal Aunt 78  . Uterine cancer Paternal Grandmother 34  . Ovarian cancer Other 68    mat great aunt through [facility]  . Ovarian cancer Maternal Grandmother 16  The patient's father died with congestive heart failure at the age of 53. The patient's mother is living, age 2. She was diagnosed with breast cancer at age 42. The patient's paternal grandmother was diagnosed with uterine cancer at age 106 and her daughter, the patient's paternal aunt, also with uterine cancer at age 63. On the mother's side one maternal aunt was diagnosed with lymphoma and the other with throat cancer. A maternal great aunt at age 77 was diagnosed with ovarian cancer.   GYNECOLOGIC HISTORY:  No LMP recorded.  menarche age 75, first live birth age 73, which the patient is aware double as the risk of breast cancer. She is GX P2. She still having regular periods. 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 Her husband Lori [name] (goes by "Lori [name]") works in Engineer, technical sales, although currently he is looking for work. Their children are Lori [name] 12 and Lori [name] 3. The patient is not a church attender    ADVANCED DIRECTIVES: Not in place   HEALTH MAINTENANCE: Social History    Substance Use Topics  . Smoking status: Former Smoker -- 1.00 packs/day for 8 years    Types: Cigarettes    Quit date: 07/20/2000  . Smokeless tobacco: Never Used  . Alcohol Use: Yes     Comment: socially     Colonoscopy:  PAP:  Bone density:  Lipid panel:  No Known Allergies  Current Outpatient Prescriptions  Medication Sig Dispense Refill  . Docusate Sodium (DULCOLAX STOOL SOFTENER PO) Take 1 tablet by mouth daily.    Marland Kitchen doxycycline (VIBRA-TABS) 100 MG tablet Take 1 tablet (100 mg total) by mouth 2 (two) times daily. (Patient not taking: Reported on 11/29/2014) 30 tablet 3  . ibuprofen (ADVIL,MOTRIN) 200 MG tablet Take 600 mg by mouth every 6 (six) hours as needed.     . loratadine (CLARITIN) 10 MG tablet Take 10 mg by mouth daily.    Marland Kitchen LORazepam (ATIVAN) 0.5 MG tablet Take 1 tablet (0.5 mg total) by mouth at bedtime. 30 tablet 0  . Probiotic Product (ALIGN PO) Take 1 capsule by mouth daily.    . prochlorperazine (COMPAZINE) 10 MG tablet Take 1 tablet (10 mg total) by mouth every 6 (six) hours as needed (Nausea or  vomiting). (Patient not taking: Reported on 11/29/2014) 30 tablet 3  . valACYclovir (VALTREX) 500 MG tablet Take 1 tablet (500 mg total) by mouth 2 (two) times daily. (Patient not taking: Reported on 11/08/2014) 90 tablet 2  . venlafaxine XR (EFFEXOR-XR) 75 MG 24 hr capsule Take 1 capsule (75 mg total) by mouth daily with breakfast. 90 capsule 3   No current facility-administered medications for this visit.   Facility-Administered Medications Ordered in Other Visits  Medication Dose Route Frequency Provider Last Rate Last Dose  . sodium chloride 0.9 % injection 10 mL  10 mL Intravenous PRN [name], MD   10 mL at 12/06/14 1001    OBJECTIVE: young White woman who appears stated age 49 Vitals:   12/06/14 1037  BP: 112/64  Pulse: 86  Temp: 98.1 F (36.7 C)  Resp: 18     Body mass index is 33.17 kg/(m^2).    ECOG FS:0 - Asymptomatic   Sclerae unicteric,  pupils round and equal Oropharynx clear and moist-- no thrush or other lesions No cervical or supraclavicular adenopathy Lungs no rales or rhonchi Heart regular rate and rhythm Abd soft, nontender, positive bowel sounds MSK no focal spinal tenderness, no upper extremity lymphedema Neuro: nonfocal, well oriented, appropriate affect Breasts: Deferred    LAB RESULTS:  CMP     Component Value Date/Time   NA 139 11/29/2014 1048   NA 139 08/03/2014 1022   K 3.7 11/29/2014 1048   K 4.1 08/03/2014 1022   CL 106 08/03/2014 1022   CO2 27 11/29/2014 1048   CO2 25 08/03/2014 1022   GLUCOSE 111 11/29/2014 1048   GLUCOSE 89 08/03/2014 1022   BUN 12.5 11/29/2014 1048   BUN 10 08/03/2014 1022   CREATININE 0.7 11/29/2014 1048   CREATININE 0.68 08/03/2014 1022   CALCIUM 9.1 11/29/2014 1048   CALCIUM 9.3 08/03/2014 1022   PROT 6.8 11/29/2014 1048   ALBUMIN 3.7 11/29/2014 1048   AST 13 11/29/2014 1048   ALT 15 11/29/2014 1048   ALKPHOS 145 11/29/2014 1048   BILITOT 0.40 11/29/2014 1048   GFRNONAA >60 08/03/2014 1022   GFRAA >60 08/03/2014 1022    INo results found for: SPEP, UPEP  Lab Results  Component Value Date   WBC 8.0 12/06/2014   NEUTROABS 5.2 12/06/2014   HGB 13.1 12/06/2014   HCT 38.8 12/06/2014   MCV 88.7 12/06/2014   PLT 213 12/06/2014      Chemistry      Component Value Date/Time   NA 139 11/29/2014 1048   NA 139 08/03/2014 1022   K 3.7 11/29/2014 1048   K 4.1 08/03/2014 1022   CL 106 08/03/2014 1022   CO2 27 11/29/2014 1048   CO2 25 08/03/2014 1022   BUN 12.5 11/29/2014 1048   BUN 10 08/03/2014 1022   CREATININE 0.7 11/29/2014 1048   CREATININE 0.68 08/03/2014 1022      Component Value Date/Time   CALCIUM 9.1 11/29/2014 1048   CALCIUM 9.3 08/03/2014 1022   ALKPHOS 145 11/29/2014 1048   AST 13 11/29/2014 1048   ALT 15 11/29/2014 1048   BILITOT 0.40 11/29/2014 1048       No results found for: LABCA2  No components found for: LABCA125  No  results for input(s): INR in the last 168 hours.  Urinalysis No results found for: COLORURINE, APPEARANCEUR, LABSPEC, PHURINE, GLUCOSEU, HGBUR, BILIRUBINUR, KETONESUR, PROTEINUR, UROBILINOGEN, NITRITE, LEUKOCYTESUR  STUDIES: No results found.  ASSESSMENT: 49 y.o. BRCA negative [place] woman status post left breast biopsy 07/04/2014 for a clinical T3 N0, stage IIA invasive lobular breast cancer, grade 2, estrogen receptor positive, progesterone receptor 1% "positive", with an MIB-1 of less than 5% and no HER-2 amplification  (1) genetics testing 07/29/2014 through the [employer] gene panel offered by [employer] found no deleterious mutations in ATM, BARD1, BRCA1, BRCA2, BRIP1, CDH1, CHEK2, EPCAM, MLH1, MRE11A, MSH2, MSH6, MUTYH, NBN, NF1, PALB2, PMS2, PTEN, RAD50, RAD51C, RAD51D, SMARCA4, STK11, or TP53.   (2) status post bilateral mastectomies 08/10/2014 showing  (a) on the right, lobular carcinoma in situ  (b) on the left, a pT3 pN1, stage IIIA invasive lobular breast cancer, HER-2 repeated and again negative  (3) chemotherapy started 10/11/2014,consisting of doxorubicin and cyclophosphamide in dose dense fashion 4, completed 11/22/2014, followed by paclitaxel weekly 12 started 12/06/2014  (4) radiation to follow chemotherapy   (5) tamoxifen was started neoadjuvantly on 07/13/2014 given the likely delay in definitive surgery while genetics results are pending; it is being held during chemotherapy   PLAN: Sherleen had a hard time with the first 4 cycles of chemotherapy, with significant fatigue, insomnia, hot flashes, mouth sores, constipation, and bone pain. Emotionally she feels overwhelmed and cries easily. She tells me she is not a talker so it is difficult for her husband to listen because his hard for her to talk.  I think she would benefit greatly from venlafaxine. This should help her control the intrusive thoughts a little bit better instead of letting them control her. It also  would help with her hot flashes. I asked her to go ahead and give it a try for 2 weeks and then decide whether she wants to continue or not.  She gets the strange palpitations reaction to dexamethasone. Accordingly I'm going to drop the dexamethasone with today's dose of 210 mg and to 4 mg with next week's dose. I am going to add lorazepam to her chemotherapy which I think will help her get through these next 3 months of planned treatment.  As far as antinausea medication is concerned at home I suggested she only take Compazine tonight. I don't think she will need to take ondansetron although she is welcome to do that if she wishes.  On the plus side she has not had any permanent complications that I am aware of. She has gained a little bit of weight and I have encouraged her to resume walking as soon as she feels her knee is doing a little bit better. We are proceeding with treatment today as planned and we will reassess next week in case we need to make any changes in her supportive therapy   MAGRINAT,GUSTAV C, MD   12/06/2014 10:40 AM

## 2014-12-07 ENCOUNTER — Telehealth: Payer: Self-pay | Admitting: Hematology and Oncology

## 2014-12-07 NOTE — Telephone Encounter (Signed)
Altered 11/1 as heather had an earlier opening,husband is aware  anne

## 2014-12-13 ENCOUNTER — Other Ambulatory Visit: Payer: BLUE CROSS/BLUE SHIELD

## 2014-12-13 ENCOUNTER — Ambulatory Visit (HOSPITAL_BASED_OUTPATIENT_CLINIC_OR_DEPARTMENT_OTHER): Payer: BLUE CROSS/BLUE SHIELD

## 2014-12-13 ENCOUNTER — Ambulatory Visit: Payer: BLUE CROSS/BLUE SHIELD

## 2014-12-13 ENCOUNTER — Other Ambulatory Visit (HOSPITAL_BASED_OUTPATIENT_CLINIC_OR_DEPARTMENT_OTHER): Payer: BLUE CROSS/BLUE SHIELD

## 2014-12-13 ENCOUNTER — Encounter: Payer: Self-pay | Admitting: Nurse Practitioner

## 2014-12-13 ENCOUNTER — Ambulatory Visit (HOSPITAL_BASED_OUTPATIENT_CLINIC_OR_DEPARTMENT_OTHER): Payer: BLUE CROSS/BLUE SHIELD | Admitting: Nurse Practitioner

## 2014-12-13 VITALS — BP 127/72 | HR 86 | Temp 98.2°F | Resp 18 | Ht 68.0 in | Wt 217.8 lb

## 2014-12-13 DIAGNOSIS — F32A Depression, unspecified: Secondary | ICD-10-CM

## 2014-12-13 DIAGNOSIS — Z5111 Encounter for antineoplastic chemotherapy: Secondary | ICD-10-CM | POA: Diagnosis not present

## 2014-12-13 DIAGNOSIS — C50412 Malignant neoplasm of upper-outer quadrant of left female breast: Secondary | ICD-10-CM

## 2014-12-13 DIAGNOSIS — D0501 Lobular carcinoma in situ of right breast: Secondary | ICD-10-CM | POA: Diagnosis not present

## 2014-12-13 DIAGNOSIS — M25562 Pain in left knee: Secondary | ICD-10-CM

## 2014-12-13 DIAGNOSIS — Z170 Estrogen receptor positive status [ER+]: Secondary | ICD-10-CM | POA: Diagnosis not present

## 2014-12-13 DIAGNOSIS — K5900 Constipation, unspecified: Secondary | ICD-10-CM

## 2014-12-13 DIAGNOSIS — M25561 Pain in right knee: Secondary | ICD-10-CM

## 2014-12-13 DIAGNOSIS — Z95828 Presence of other vascular implants and grafts: Secondary | ICD-10-CM

## 2014-12-13 DIAGNOSIS — M25569 Pain in unspecified knee: Secondary | ICD-10-CM | POA: Insufficient documentation

## 2014-12-13 DIAGNOSIS — F329 Major depressive disorder, single episode, unspecified: Secondary | ICD-10-CM

## 2014-12-13 LAB — COMPREHENSIVE METABOLIC PANEL (CC13)
ALBUMIN: 3.7 g/dL (ref 3.5–5.0)
ALK PHOS: 83 U/L (ref 40–150)
ALT: 52 U/L (ref 0–55)
AST: 24 U/L (ref 5–34)
Anion Gap: 7 mEq/L (ref 3–11)
BILIRUBIN TOTAL: 0.32 mg/dL (ref 0.20–1.20)
BUN: 10.5 mg/dL (ref 7.0–26.0)
CALCIUM: 9.5 mg/dL (ref 8.4–10.4)
CO2: 26 mEq/L (ref 22–29)
CREATININE: 0.7 mg/dL (ref 0.6–1.1)
Chloride: 106 mEq/L (ref 98–109)
EGFR: >90 mL/min/{1.73_m2} (ref >90)
GLUCOSE: 102 mg/dL (ref 70–140)
POTASSIUM: 3.9 meq/L (ref 3.5–5.1)
Sodium: 140 mEq/L (ref 136–145)
TOTAL PROTEIN: 6.7 g/dL (ref 6.4–8.3)

## 2014-12-13 LAB — CBC WITH DIFFERENTIAL/PLATELET
BASO%: 3 % — ABNORMAL HIGH (ref 0.0–2.0)
BASOS ABS: 0.2 10*3/uL — AB (ref 0.0–0.1)
EOS ABS: 0.1 10*3/uL (ref 0.0–0.5)
EOS%: 1.3 % (ref 0.0–7.0)
HEMATOCRIT: 35.7 % (ref 34.8–46.6)
HEMOGLOBIN: 12.2 g/dL (ref 11.6–15.9)
LYMPH#: 1.2 10*3/uL (ref 0.9–3.3)
LYMPH%: 18.7 % (ref 14.0–49.7)
MCH: 30.5 pg (ref 25.1–34.0)
MCHC: 34.2 g/dL (ref 31.5–36.0)
MCV: 89.3 fL (ref 79.5–101.0)
MONO#: 0.6 10*3/uL (ref 0.1–0.9)
MONO%: 9.9 % (ref 0.0–14.0)
NEUT%: 67.1 % (ref 38.4–76.8)
NEUTROS ABS: 4.2 10*3/uL (ref 1.5–6.5)
Platelets: 289 10*3/uL (ref 145–400)
RBC: 4 10*6/uL (ref 3.70–5.45)
RDW: 15 % — AB (ref 11.2–14.5)
WBC: 6.3 10*3/uL (ref 3.9–10.3)

## 2014-12-13 MED ORDER — DIPHENHYDRAMINE HCL 50 MG/ML IJ SOLN
25.0000 mg | Freq: Once | INTRAMUSCULAR | Status: AC
  Administered 2014-12-13: 25 mg via INTRAVENOUS

## 2014-12-13 MED ORDER — SODIUM CHLORIDE 0.9 % IJ SOLN
10.0000 mL | INTRAMUSCULAR | Status: DC | PRN
  Administered 2014-12-13: 10 mL
  Filled 2014-12-13: qty 10

## 2014-12-13 MED ORDER — SODIUM CHLORIDE 0.9 % IJ SOLN
10.0000 mL | INTRAMUSCULAR | Status: DC | PRN
  Administered 2014-12-13: 10 mL via INTRAVENOUS
  Filled 2014-12-13: qty 10

## 2014-12-13 MED ORDER — FAMOTIDINE IN NACL 20-0.9 MG/50ML-% IV SOLN
INTRAVENOUS | Status: AC
Start: 2014-12-13 — End: 2014-12-13
  Filled 2014-12-13: qty 50

## 2014-12-13 MED ORDER — LORAZEPAM 2 MG/ML IJ SOLN
0.5000 mg | Freq: Once | INTRAMUSCULAR | Status: AC
  Administered 2014-12-13: 0.5 mg via INTRAVENOUS

## 2014-12-13 MED ORDER — SODIUM CHLORIDE 0.9 % IV SOLN
Freq: Once | INTRAVENOUS | Status: AC
  Administered 2014-12-13: 12:00:00 via INTRAVENOUS
  Filled 2014-12-13: qty 4

## 2014-12-13 MED ORDER — PACLITAXEL CHEMO INJECTION 300 MG/50ML
80.0000 mg/m2 | Freq: Once | INTRAVENOUS | Status: AC
  Administered 2014-12-13: 174 mg via INTRAVENOUS
  Filled 2014-12-13: qty 29

## 2014-12-13 MED ORDER — LORAZEPAM 2 MG/ML IJ SOLN
INTRAMUSCULAR | Status: AC
  Filled 2014-12-13: qty 1

## 2014-12-13 MED ORDER — FAMOTIDINE IN NACL 20-0.9 MG/50ML-% IV SOLN
20.0000 mg | Freq: Once | INTRAVENOUS | Status: AC
  Administered 2014-12-13: 20 mg via INTRAVENOUS

## 2014-12-13 MED ORDER — DIPHENHYDRAMINE HCL 50 MG/ML IJ SOLN
INTRAMUSCULAR | Status: AC
  Filled 2014-12-13: qty 1

## 2014-12-13 MED ORDER — SODIUM CHLORIDE 0.9 % IV SOLN
Freq: Once | INTRAVENOUS | Status: AC
  Administered 2014-12-13: 11:00:00 via INTRAVENOUS

## 2014-12-13 MED ORDER — HEPARIN SOD (PORK) LOCK FLUSH 100 UNIT/ML IV SOLN
500.0000 [IU] | Freq: Once | INTRAVENOUS | Status: AC | PRN
  Administered 2014-12-13: 500 [IU]
  Filled 2014-12-13: qty 5

## 2014-12-13 NOTE — Progress Notes (Signed)
[facility] [place] [facility]  [phone] [phone]   ID: Lori [name] DOB: 1965-06-19  MR#: [id number]  MCN#:[id number]  Patient Care Team: Lori Shan, MD as PCP - General (Family Medicine) Lori Messing III, MD as Consulting Physician (General Surgery) Lori Cruel, MD as Consulting Physician (Oncology) Lori Gibson, MD as Attending Physician (Radiation Oncology) Lori Germany, RN as Registered Nurse Lori Kaufmann, RN as Registered Nurse Lori Bouche, NP as Nurse Practitioner (Nurse Practitioner) PCP: Lori Simmer, MD GYN: Lori Ache MD OTHER MD:  CHIEF COMPLAINT: Estrogen receptor positive breast cancer  CURRENT TREATMENT: adjuvant chemotherapy  BREAST CANCER HISTORY: From the original intake note:  Lori [name] (who is the daughter of my former patient, Lori [name], herself now 10 years out from her T1 cN0 invasive ductal carcinoma, treated with anti-estrogens only) [facility] April 20 03/23/2014. Breast density was category C. A possible mass in the left breast upper outer quadrant was felt to warrant further evaluation, and on 07/04/2014 the patient underwent left mammography with tomosynthesis and left breast ultrasonography. At this point the patient felt she was able to palpate a mass in the area in question. Tomosynthesis did reveal an irregular mass in the upper outer left breast measuring 2.3 cm, associated on physical exam with a large area of thickening. Ultrasound of the area in question confirmed an irregular mass at the 1:00 position 5 cm from the nipple measuring 2.2 cm. The left axilla was negative sonographically.  Biopsy of the mass in question the same day, 07/04/2014, showed ([id number]) an invasive lobular breast cancer which was estrogen receptor 50% positive with strong staining intensity, progesterone receptor 1% positive with moderate staining intensity, with a proliferation  marker of 1%, and no HER-2 amplification, the signals ratio being 1.19 and the number per cell 1.55.  On 07/08/2014 the patient underwent bilateral breast MRI. This showed no abnormal adenopathy and no abnormality in the right breast. In the left breast however there was a 9.3 area of abnormal enhancement involving all quadrants. Within the upper outer quadrant there was a denser area which measured up to 5.1 cm.  The patient's subsequent history is as detailed below  INTERVAL HISTORY: Lori [name] returns today for follow-up of her breast cancer accompanied by her husband Lori [name]. Today she is due for cycle 2 of 12 planned cycles of weekly paclitaxel.  REVIEW OF SYSTEMS: Lori [name] has a few new complaints this week. Her main concern with her first dose of paclitaxel was excessive fatigue on days 3-4. She felt very sluggish and is only now recovering. She has extensive join aches, especially to her knees which are "cracking and popping" in an unfamiliar fashion. Her thighs and forearms ached as well. She complains of crusty eyes and sticky lips she she wakes up. She denies fevers or chills. Her nausea was minimal. She is constipated despite the use of stool softeners. 1 week into venlafaxine and she believes her mood and hot flashes have somewhat improve. Her appetite is good. She denies mouth sores, rashes, or neuropathy symptoms. A detailed review of systems is otherwise stable.  PAST MEDICAL HISTORY: Past Medical History  Diagnosis Date  . Asthma     last trimester of pregnancy 3 yrs ago-  never had problems since then, no meds  . Breast cancer   . Anxiety     PAST SURGICAL HISTORY: Past Surgical History  Procedure Laterality Date  . Cesarean section  x 2  . Knee arthroscopy Left 07/27/2013    Procedure: LEFT ARTHROSCOPY KNEE WITH MEDIAL MENISCAL DEBRIDEMENT;  Surgeon: Gearlean Alf, MD;  Location: [facility];  Service: Orthopedics;  Laterality: Left;  Marland Kitchen Mastectomy w/ sentinel node biopsy Left  08/10/2014  . Mastectomy w/ sentinel node biopsy Bilateral 08/10/2014    Procedure: MASTECTOMY WITH SENTINEL LYMPH NODE BIOPSY;  Surgeon: Lori Messing III, MD;  Location: [facility];  Service: General;  Laterality: Bilateral;  . Portacath placement Right 09/07/2014    Procedure: INSERTION PORT-A-CATH;  Surgeon: Lori Messing III, MD;  Location: [facility];  Service: General;  Laterality: Right;    FAMILY HISTORY Family History  Problem Relation Age of Onset  . Breast cancer Lori [name] 21  . Lymphoma Lori [name] 73  . Uterine cancer Lori [name] 16  . Uterine cancer Lori [name] 27  . Ovarian cancer Other 58    mat great [name] through [facility]  . Ovarian cancer Lori [name] 81  The patient's father died with congestive heart failure at the age of 38. The patient's Lori [name] is living, age 52. She was diagnosed with breast cancer at age 60. The patient's Lori [name] was diagnosed with uterine cancer at age 31 and her daughter, the patient's Lori [name], also with uterine cancer at age 52. On the Lori [name]'s side one Lori [name] was diagnosed with lymphoma and the other with throat cancer. A Lori great [name] at age 72 was diagnosed with ovarian cancer.   GYNECOLOGIC HISTORY:  No LMP recorded.  menarche age 33, first live birth age 6, which the patient is aware double as the risk of breast cancer. She is GX P2. She still having regular periods. 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 Her husband Lori [name] (goes by "Lori [name]") works in Engineer, technical sales, although currently he is looking for work. Their children are Lori [name] 12 and Lori [name] 3. The patient is not a church attender    ADVANCED DIRECTIVES: Not in place   HEALTH MAINTENANCE: Social History  Substance Use Topics  . Smoking status: Former Smoker -- 1.00 packs/day for 8 years    Types: Cigarettes    Quit date: 07/20/2000  . Smokeless tobacco:  Never Used  . Alcohol Use: Yes     Comment: socially     Colonoscopy:  PAP:  Bone density:  Lipid panel:  No Known Allergies  Current Outpatient Prescriptions  Medication Sig Dispense Refill  . Docusate Sodium (DULCOLAX STOOL SOFTENER PO) Take 3 tablets by mouth daily.     Marland Kitchen ibuprofen (ADVIL,MOTRIN) 200 MG tablet Take 600 mg by mouth every 6 (six) hours as needed.     Marland Kitchen LORazepam (ATIVAN) 0.5 MG tablet Take 1 tablet (0.5 mg total) by mouth at bedtime. (Patient taking differently: Take 0.5 mg by mouth as needed. ) 30 tablet 0  . polyethylene glycol (MIRALAX / GLYCOLAX) packet Take 17 g by mouth daily.    . Probiotic Product (ALIGN PO) Take 1 capsule by mouth daily.    . prochlorperazine (COMPAZINE) 10 MG tablet Take 1 tablet (10 mg total) by mouth every 6 (six) hours as needed (Nausea or vomiting). 30 tablet 3  . venlafaxine XR (EFFEXOR-XR) 75 MG 24 hr capsule Take 1 capsule (75 mg total) by mouth daily with breakfast. 90 capsule 3  . zolpidem (AMBIEN) 5 MG tablet Take 5 mg by mouth at bedtime as needed for  sleep.    . loratadine (CLARITIN) 10 MG tablet Take 10 mg by mouth daily.     No current facility-administered medications for this visit.   Facility-Administered Medications Ordered in Other Visits  Medication Dose Route Frequency Provider Last Rate Last Dose  . heparin lock flush 100 unit/mL  500 Units Intracatheter Once PRN Lori Cruel, MD      . PACLitaxel (TAXOL) 174 mg in dextrose 5 % 250 mL chemo infusion (</= 55m/m2)  80 mg/m2 (Treatment Plan Actual) Intravenous Once GChauncey Cruel MD 279 mL/hr at 12/13/14 1154 174 mg at 12/13/14 1154  . sodium chloride 0.9 % injection 10 mL  10 mL Intracatheter PRN GChauncey Cruel MD        OBJECTIVE: young White woman who appears stated age F9Vitals:   12/13/14 1012  BP: 127/72  Pulse: 86  Temp: 98.2 F (36.8 C)  Resp: 18     Body mass index is 33.12 kg/(m^2).    ECOG FS:0 - Asymptomatic   Sclerae unicteric,  pupils round and equal Oropharynx clear and moist-- no thrush or other lesions No cervical or supraclavicular adenopathy Lungs no rales or rhonchi Heart regular rate and rhythm Abd soft, nontender, positive bowel sounds MSK no focal spinal tenderness, no upper extremity lymphedema Neuro: nonfocal, well oriented, appropriate affect Breasts: Deferred    LAB RESULTS:  CMP     Component Value Date/Time   NA 140 12/13/2014 0945   NA 139 08/03/2014 1022   K 3.9 12/13/2014 0945   K 4.1 08/03/2014 1022   CL 106 08/03/2014 1022   CO2 26 12/13/2014 0945   CO2 25 08/03/2014 1022   GLUCOSE 102 12/13/2014 0945   GLUCOSE 89 08/03/2014 1022   BUN 10.5 12/13/2014 0945   BUN 10 08/03/2014 1022   CREATININE 0.7 12/13/2014 0945   CREATININE 0.68 08/03/2014 1022   CALCIUM 9.5 12/13/2014 0945   CALCIUM 9.3 08/03/2014 1022   PROT 6.7 12/13/2014 0945   ALBUMIN 3.7 12/13/2014 0945   AST 24 12/13/2014 0945   ALT 52 12/13/2014 0945   ALKPHOS 83 12/13/2014 0945   BILITOT 0.32 12/13/2014 0945   GFRNONAA >60 08/03/2014 1022   GFRAA >60 08/03/2014 1022    INo results found for: SPEP, UPEP  Lab Results  Component Value Date   WBC 6.3 12/13/2014   NEUTROABS 4.2 12/13/2014   HGB 12.2 12/13/2014   HCT 35.7 12/13/2014   MCV 89.3 12/13/2014   PLT 289 12/13/2014      Chemistry      Component Value Date/Time   NA 140 12/13/2014 0945   NA 139 08/03/2014 1022   K 3.9 12/13/2014 0945   K 4.1 08/03/2014 1022   CL 106 08/03/2014 1022   CO2 26 12/13/2014 0945   CO2 25 08/03/2014 1022   BUN 10.5 12/13/2014 0945   BUN 10 08/03/2014 1022   CREATININE 0.7 12/13/2014 0945   CREATININE 0.68 08/03/2014 1022      Component Value Date/Time   CALCIUM 9.5 12/13/2014 0945   CALCIUM 9.3 08/03/2014 1022   ALKPHOS 83 12/13/2014 0945   AST 24 12/13/2014 0945   ALT 52 12/13/2014 0945   BILITOT 0.32 12/13/2014 0945       No results found for: LABCA2  No components found for: LABCA125  No  results for input(s): INR in the last 168 hours.  Urinalysis No results found for: COLORURINE, APPEARANCEUR, [place] NITRITE, LEUKOCYTESUR  STUDIES: No results found.  ASSESSMENT: 49 y.o. BRCA negative [place] woman status post left breast biopsy 07/04/2014 for a clinical T3 N0, stage IIA invasive lobular breast cancer, grade 2, estrogen receptor positive, progesterone receptor 1% "positive", with an MIB-1 of less than 5% and no HER-2 amplification  (1) genetics testing 07/29/2014 through the [employer] gene panel offered by [employer] found no deleterious mutations in ATM, BARD1, BRCA1, BRCA2, BRIP1, CDH1, CHEK2, EPCAM, MLH1, MRE11A, MSH2, MSH6, MUTYH, NBN, NF1, PALB2, PMS2, PTEN, RAD50, RAD51C, RAD51D, SMARCA4, STK11, or TP53.   (2) status post bilateral mastectomies 08/10/2014 showing  (a) on the right, lobular carcinoma in situ  (b) on the left, a pT3 pN1, stage IIIA invasive lobular breast cancer, HER-2 repeated and again negative  (3) chemotherapy started 10/11/2014,consisting of doxorubicin and cyclophosphamide in dose dense fashion 4, completed 11/22/2014, followed by paclitaxel weekly 12 started 12/06/2014  (4) radiation to follow chemotherapy   (5) tamoxifen was started neoadjuvantly on 07/13/2014 given the likely delay in definitive surgery while genetics results are pending; it is being held during chemotherapy   PLAN: Kianah is having some trouble transitioning to paclitaxel, but is willing to give it another try. It is encouraging that the venlafaxine is already proving helpful. The labs were reviewed in detail and were stable. She will proceed with cycle 2 of treatment as planned.   She will restart miralax daily to assist the stool softeners with her constipation. She will alternate tylenol and ibuprofen for her joint aches. She will back off of use of the biotene and rinse with watered down  listerine or warm salt water, going with the theory that the biotene is making her mucosa "too" moist.   Masaye will return in 1 week for cycle 3 of treatment. She understands and agrees with this plan. She knows the goal of treatment in her case is cure. She has been encouraged to call with any issues that might arise before her next visit here.    Laurie Panda, NP   12/13/2014 12:30 PM

## 2014-12-13 NOTE — Patient Instructions (Signed)
[facility] Discharge Instructions for Patients Receiving Chemotherapy  Today you received the following chemotherapy agents taxol  To help prevent nausea and vomiting after your treatment, we encourage you to take your nausea medication as directed   If you develop nausea and vomiting that is not controlled by your nausea medication, call the clinic.   BELOW ARE SYMPTOMS THAT SHOULD BE REPORTED IMMEDIATELY:  *FEVER GREATER THAN 100.5 F  *CHILLS WITH OR WITHOUT FEVER  NAUSEA AND VOMITING THAT IS NOT CONTROLLED WITH YOUR NAUSEA MEDICATION  *UNUSUAL SHORTNESS OF BREATH  *UNUSUAL BRUISING OR BLEEDING  TENDERNESS IN MOUTH AND THROAT WITH OR WITHOUT PRESENCE OF ULCERS  *URINARY PROBLEMS  *BOWEL PROBLEMS  UNUSUAL RASH Items with * indicate a potential emergency and should be followed up as soon as possible.  Feel free to call the clinic you have any questions or concerns. The clinic phone number is [phone].  

## 2014-12-13 NOTE — Patient Instructions (Signed)

## 2014-12-15 ENCOUNTER — Encounter: Payer: Self-pay | Admitting: General Practice

## 2014-12-15 NOTE — Progress Notes (Signed)
Spiritual Care Note  Spoke to pt by phone, providing some emotional support and normalization of feelings.  We scheduled an appointment in my office tomorrow at 11:00am for further support.  Pt verbalized appreciation and named need for support/opportunity to process.  [place], [place], [facility] Pager [phone] Voicemail [phone]

## 2014-12-16 ENCOUNTER — Encounter: Payer: Self-pay | Admitting: General Practice

## 2014-12-16 NOTE — Progress Notes (Signed)
Spiritual Care Note  Reschedule appointment to phone meeting 10/17 at 10:30am.  Phone will take less energy for Lori [name] and allow her not to have to wait to receive support until she feels well enough to come in.  [place], [place], [facility] Pager [phone] Voicemail  [phone]

## 2014-12-19 ENCOUNTER — Encounter: Payer: Self-pay | Admitting: General Practice

## 2014-12-19 NOTE — Progress Notes (Signed)
Spiritual Care Note  Provided spiritual direction and emotional support by phone to accommodate Lori [name]'s energy level.  She used opportunity well to share and process what she is struggling with, how she is coping, and what she is learning (about life/herself).  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  Lori [name] notes that she has coped well with breast loss, hair loss, and weight gain.  She uses the mantras "Stay in the day," "It's temporary," and "Now you have to focus on YOU" as ways to redirect her focus to the present moment.  She notes that she and her husband Lori [name] are "both becoming stronger people" through their struggles and adaptation.  We talked about how spiritual growth often comes from leaning into our nondominant ways, which she found meaningful.  She also plans to return to breast cancer and young women's breast cancer support groups after chemo (when she has energy), because they were so helpful beforehand.  She values her [facility]'s weekly check-ins, as well.  Overall, she presented as coping well emotionally and spiritually, despite the chemo fatigue.  Lori [name] plans to reach out anytime she would like more spiritual support, but please also page as needs arise.  Thank you.  [place], [place] Pager [phone] Voicemail  [phone]

## 2014-12-20 ENCOUNTER — Other Ambulatory Visit: Payer: BLUE CROSS/BLUE SHIELD

## 2014-12-20 ENCOUNTER — Other Ambulatory Visit (HOSPITAL_BASED_OUTPATIENT_CLINIC_OR_DEPARTMENT_OTHER): Payer: BLUE CROSS/BLUE SHIELD

## 2014-12-20 ENCOUNTER — Ambulatory Visit: Payer: BLUE CROSS/BLUE SHIELD

## 2014-12-20 ENCOUNTER — Telehealth: Payer: Self-pay | Admitting: Oncology

## 2014-12-20 ENCOUNTER — Ambulatory Visit (HOSPITAL_BASED_OUTPATIENT_CLINIC_OR_DEPARTMENT_OTHER): Payer: BLUE CROSS/BLUE SHIELD | Admitting: Nurse Practitioner

## 2014-12-20 ENCOUNTER — Encounter: Payer: Self-pay | Admitting: Nurse Practitioner

## 2014-12-20 VITALS — BP 105/53 | HR 105 | Temp 98.5°F | Resp 18 | Ht 68.0 in | Wt 219.9 lb

## 2014-12-20 DIAGNOSIS — R200 Anesthesia of skin: Secondary | ICD-10-CM

## 2014-12-20 DIAGNOSIS — D0501 Lobular carcinoma in situ of right breast: Secondary | ICD-10-CM | POA: Diagnosis not present

## 2014-12-20 DIAGNOSIS — C50412 Malignant neoplasm of upper-outer quadrant of left female breast: Secondary | ICD-10-CM | POA: Diagnosis not present

## 2014-12-20 DIAGNOSIS — Z170 Estrogen receptor positive status [ER+]: Secondary | ICD-10-CM

## 2014-12-20 DIAGNOSIS — G629 Polyneuropathy, unspecified: Secondary | ICD-10-CM

## 2014-12-20 DIAGNOSIS — K1379 Other lesions of oral mucosa: Secondary | ICD-10-CM

## 2014-12-20 LAB — COMPREHENSIVE METABOLIC PANEL (CC13)
ALT: 50 U/L (ref 0–55)
ANION GAP: 9 meq/L (ref 3–11)
AST: 25 U/L (ref 5–34)
Albumin: 3.6 g/dL (ref 3.5–5.0)
Alkaline Phosphatase: 73 U/L (ref 40–150)
BILIRUBIN TOTAL: 0.3 mg/dL (ref 0.20–1.20)
BUN: 9 mg/dL (ref 7.0–26.0)
CALCIUM: 9.5 mg/dL (ref 8.4–10.4)
CHLORIDE: 107 meq/L (ref 98–109)
CO2: 25 mEq/L (ref 22–29)
CREATININE: 0.7 mg/dL (ref 0.6–1.1)
EGFR: >90 mL/min/{1.73_m2} (ref >90)
Glucose: 79 mg/dl (ref 70–140)
Potassium: 3.9 mEq/L (ref 3.5–5.1)
Sodium: 141 mEq/L (ref 136–145)
Total Protein: 6.8 g/dL (ref 6.4–8.3)

## 2014-12-20 LAB — CBC WITH DIFFERENTIAL/PLATELET
BASO%: 2.9 % — AB (ref 0.0–2.0)
BASOS ABS: 0.1 10*3/uL (ref 0.0–0.1)
EOS%: 3.7 % (ref 0.0–7.0)
Eosinophils Absolute: 0.2 10*3/uL (ref 0.0–0.5)
HEMATOCRIT: 36.7 % (ref 34.8–46.6)
HGB: 12.4 g/dL (ref 11.6–15.9)
LYMPH#: 0.9 10*3/uL (ref 0.9–3.3)
LYMPH%: 20.9 % (ref 14.0–49.7)
MCH: 30.6 pg (ref 25.1–34.0)
MCHC: 33.9 g/dL (ref 31.5–36.0)
MCV: 90.3 fL (ref 79.5–101.0)
MONO#: 0.5 10*3/uL (ref 0.1–0.9)
MONO%: 11.1 % (ref 0.0–14.0)
NEUT#: 2.7 10*3/uL (ref 1.5–6.5)
NEUT%: 61.4 % (ref 38.4–76.8)
PLATELETS: 265 10*3/uL (ref 145–400)
RBC: 4.06 10*6/uL (ref 3.70–5.45)
RDW: 17.1 % — ABNORMAL HIGH (ref 11.2–14.5)
WBC: 4.4 10*3/uL (ref 3.9–10.3)

## 2014-12-20 LAB — VITAMIN B12: VITAMIN B 12: 1876 pg/mL — AB (ref 211–911)

## 2014-12-20 MED ORDER — SODIUM CHLORIDE 0.9 % IJ SOLN
10.0000 mL | INTRAMUSCULAR | Status: DC | PRN
  Filled 2014-12-20: qty 10

## 2014-12-20 NOTE — Progress Notes (Signed)
[place]  [phone]) [phone] [phone]   ID: Lori [name] DOB: October 03, 1965  MR#: [id number]  HKG#:[id number]  Patient Care Team: Lori Shan, MD as PCP - General (Family Medicine) Lori Messing III, MD as Consulting Physician (General Surgery) Lori Cruel, MD as Consulting Physician (Oncology) Lori Gibson, MD as Attending Physician (Radiation Oncology) Lori Germany, RN as Registered Nurse Lori Kaufmann, RN as Registered Nurse Lori Bouche, NP as Nurse Practitioner (Nurse Practitioner) PCP: Lori Simmer, MD GYN: Lori Ache MD OTHER MD:  CHIEF COMPLAINT: Estrogen receptor positive breast cancer  CURRENT TREATMENT: adjuvant chemotherapy  BREAST CANCER HISTORY: From the original intake note:  Lori [name] (who is the daughter of my former patient, Lori [name], herself now 10 years out from her T1 cN0 invasive ductal carcinoma, treated with anti-estrogens only) [facility] April 20 03/23/2014. Breast density was category C. A possible mass in the left breast upper outer quadrant was felt to warrant further evaluation, and on 07/04/2014 the patient underwent left mammography with tomosynthesis and left breast ultrasonography. At this point the patient felt she was able to palpate a mass in the area in question. Tomosynthesis did reveal an irregular mass in the upper outer left breast measuring 2.3 cm, associated on physical exam with a large area of thickening. Ultrasound of the area in question confirmed an irregular mass at the 1:00 position 5 cm from the nipple measuring 2.2 cm. The left axilla was negative sonographically.  Biopsy of the mass in question the same day, 07/04/2014, showed (SAA [phone]) an invasive lobular breast cancer which was estrogen receptor 50% positive with strong staining intensity, progesterone receptor 1% positive with moderate staining intensity, with a proliferation  marker of 1%, and no HER-2 amplification, the signals ratio being 1.19 and the number per cell 1.55.  On 07/08/2014 the patient underwent bilateral breast MRI. This showed no abnormal adenopathy and no abnormality in the right breast. In the left breast however there was a 9.3 area of abnormal enhancement involving all quadrants. Within the upper outer quadrant there was a denser area which measured up to 5.1 cm.  The patient's subsequent history is as detailed below  INTERVAL HISTORY: Lori [name] for follow-up of her breast cancer accompanied by her husband,Lori [name]. [name] she is due for cycle 3 of 12 planned cycles of weekly paclitaxel. New this week is numbness around the outside of her mouth spreading to her bilateral cheeks. She also has sensitivity to the fingertips of 3 fingers on the right hand. No changes noticed to the left hand or either feet. She is also having an increased occurrence of mouth sores to the corners of her mouth as well as inside the oral cavity. She restarted her valacyclovir, but she does not believe it is helping, and may actually be making her worse. She has a history of cold sores and is also using Abreva to the external lesions.  REVIEW OF SYSTEMS: Lori [name] denies fevers, chills, nausea, vomiting. She had 1 day of diarrhea that was actually relieving as she generally has constipation. This has since resolved. Her fatigue is immense and particularly disruptive for her. She has to rest after doing chores. She has continues joint pain to her knees, thighs, and forearms but they are not as bad as las week. Her mood and hot flashes are significantly improved on the venlafaxine. A detailed review of systems is otherwise stable.  PAST MEDICAL HISTORY: Past  Medical History  Diagnosis Date  . Asthma     last trimester of pregnancy 3 yrs ago-  never had problems since then, no meds  . Breast cancer ([place])   . Anxiety     PAST SURGICAL HISTORY: Past Surgical History    Procedure Laterality Date  . Cesarean section      x 2  . Knee arthroscopy Left 07/27/2013    Procedure: LEFT ARTHROSCOPY KNEE WITH MEDIAL MENISCAL DEBRIDEMENT;  Surgeon: Lori Alf, MD;  Location: [facility];  Service: Orthopedics;  Laterality: Left;  Marland Kitchen Mastectomy w/ sentinel node biopsy Left 08/10/2014  . Mastectomy w/ sentinel node biopsy Bilateral 08/10/2014    Procedure: MASTECTOMY WITH SENTINEL LYMPH NODE BIOPSY;  Surgeon: Lori Messing III, MD;  Location: [place];  Service: General;  Laterality: Bilateral;  . Portacath placement Right 09/07/2014    Procedure: INSERTION PORT-A-CATH;  Surgeon: Lori Messing III, MD;  Location: [place];  Service: General;  Laterality: Right;    FAMILY HISTORY Family History  Problem Relation Age of Onset  . Breast cancer Mother 33  . Lymphoma Maternal Aunt 71  . Uterine cancer Paternal Aunt 34  . Uterine cancer Paternal Grandmother 35  . Ovarian cancer Other 39    mat great aunt through [facility]  . Ovarian cancer Maternal Grandmother 67  The patient's father died with congestive heart failure at the age of 50. The patient's mother is living, age 8. She was diagnosed with breast cancer at age 23. The patient's paternal grandmother was diagnosed with uterine cancer at age 39 and her daughter, the patient's paternal aunt, also with uterine cancer at age 36. On the mother's side one maternal aunt was diagnosed with lymphoma and the other with throat cancer. A maternal great aunt at age 34 was diagnosed with ovarian cancer.   GYNECOLOGIC HISTORY:  No LMP recorded.  menarche age 36, first live birth age 58, which the patient is aware double as the risk of breast cancer. She is GX P2. She still having regular periods. 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 Her husband Lori [name] (goes by "Lori [name]") works in Engineer, technical sales, although currently he is looking for work. Their children  are Lori [name] 12 and Lori [name] 3. The patient is not a church attender    ADVANCED DIRECTIVES: Not in place   HEALTH MAINTENANCE: Social History  Substance Use Topics  . Smoking status: Former Smoker -- 1.00 packs/day for 8 years    Types: Cigarettes    Quit date: 07/20/2000  . Smokeless tobacco: Never Used  . Alcohol Use: Yes     Comment: socially     Colonoscopy:  PAP:  Bone density:  Lipid panel:  No Known Allergies  Current Outpatient Prescriptions  Medication Sig Dispense Refill  . loratadine (CLARITIN) 10 MG tablet Take 10 mg by mouth daily.    . polyethylene glycol (MIRALAX / GLYCOLAX) packet Take 17 g by mouth daily.    . Probiotic Product (ALIGN PO) Take 1 capsule by mouth daily.    Marland Kitchen venlafaxine XR (EFFEXOR-XR) 75 MG 24 hr capsule Take 1 capsule (75 mg total) by mouth daily with breakfast. 90 capsule 3  . zolpidem (AMBIEN) 5 MG tablet Take 5 mg by mouth at bedtime as needed for sleep.    Mariane Baumgarten Sodium (DULCOLAX STOOL SOFTENER PO) Take 3 tablets by mouth daily.     Marland Kitchen  ibuprofen (ADVIL,MOTRIN) 200 MG tablet Take 600 mg by mouth every 6 (six) hours as needed.     Marland Kitchen LORazepam (ATIVAN) 0.5 MG tablet Take 1 tablet (0.5 mg total) by mouth at bedtime. (Patient not taking: Reported on 12/20/2014) 30 tablet 0  . prochlorperazine (COMPAZINE) 10 MG tablet Take 1 tablet (10 mg total) by mouth every 6 (six) hours as needed (Nausea or vomiting). (Patient not taking: Reported on 12/20/2014) 30 tablet 3   No current facility-administered medications for this visit.   Facility-Administered Medications Ordered in Other Visits  Medication Dose Route Frequency Provider Last Rate Last Dose  . sodium chloride 0.9 % injection 10 mL  10 mL Intravenous PRN Lori Cruel, MD   10 mL at 12/20/14 0908    OBJECTIVE: young White woman who appears stated age 33 Vitals:   12/20/14 0921  BP: 105/53  Pulse:   Temp:   Resp:      Body mass index is 33.44 kg/(m^2).    ECOG FS:0 - Asymptomatic     Skin: warm, dry  HEENT: sclerae anicteric, conjunctivae pink, oropharynx clear. No thrush or mucositis.  Lymph Nodes: No cervical or supraclavicular lymphadenopathy  Lungs: clear to auscultation bilaterally, no rales, wheezes, or rhonci  Heart: regular rate and rhythm  Abdomen: round, soft, non tender, positive bowel sounds  Musculoskeletal: No focal spinal tenderness, no peripheral edema  Neuro: non focal, well oriented, positive affect  Breasts: deferred  LAB RESULTS:  CMP     Component Value Date/Time   NA 141 12/20/2014 0905   NA 139 08/03/2014 1022   K 3.9 12/20/2014 0905   K 4.1 08/03/2014 1022   CL 106 08/03/2014 1022   CO2 25 12/20/2014 0905   CO2 25 08/03/2014 1022   GLUCOSE 79 12/20/2014 0905   GLUCOSE 89 08/03/2014 1022   BUN 9.0 12/20/2014 0905   BUN 10 08/03/2014 1022   CREATININE 0.7 12/20/2014 0905   CREATININE 0.68 08/03/2014 1022   CALCIUM 9.5 12/20/2014 0905   CALCIUM 9.3 08/03/2014 1022   PROT 6.8 12/20/2014 0905   ALBUMIN 3.6 12/20/2014 0905   AST 25 12/20/2014 0905   ALT 50 12/20/2014 0905   ALKPHOS 73 12/20/2014 0905   BILITOT 0.30 12/20/2014 0905   GFRNONAA >60 08/03/2014 1022   GFRAA >60 08/03/2014 1022    INo results found for: SPEP, UPEP  Lab Results  Component Value Date   WBC 4.4 12/20/2014   NEUTROABS 2.7 12/20/2014   HGB 12.4 12/20/2014   HCT 36.7 12/20/2014   MCV 90.3 12/20/2014   PLT 265 12/20/2014      Chemistry      Component Value Date/Time   NA 141 12/20/2014 0905   NA 139 08/03/2014 1022   K 3.9 12/20/2014 0905   K 4.1 08/03/2014 1022   CL 106 08/03/2014 1022   CO2 25 12/20/2014 0905   CO2 25 08/03/2014 1022   BUN 9.0 12/20/2014 0905   BUN 10 08/03/2014 1022   CREATININE 0.7 12/20/2014 0905   CREATININE 0.68 08/03/2014 1022      Component Value Date/Time   CALCIUM 9.5 12/20/2014 0905   CALCIUM 9.3 08/03/2014 1022   ALKPHOS 73 12/20/2014 0905   AST 25 12/20/2014 0905   ALT 50 12/20/2014 0905   BILITOT  0.30 12/20/2014 0905       No results found for: LABCA2  No components found for: LABCA125  No results for input(s): INR in the last 168 hours.  Urinalysis  No results found for: COLORURINE, APPEARANCEUR, LABSPEC, PHURINE, GLUCOSEU, HGBUR, BILIRUBINUR, KETONESUR, PROTEINUR, UROBILINOGEN, NITRITE, LEUKOCYTESUR  STUDIES: No results found.  ASSESSMENT: 5 [name].o. BRCA negative [place] woman status post left breast biopsy 07/04/2014 for a clinical T3 N0, stage IIA invasive lobular breast cancer, grade 2, estrogen receptor positive, progesterone receptor 1% "positive", with an MIB-1 of less than 5% and no HER-2 amplification  (1) genetics testing 07/29/2014 through the [employer] gene panel offered by [employer] found no deleterious mutations in ATM, BARD1, BRCA1, BRCA2, BRIP1, CDH1, CHEK2, EPCAM, MLH1, MRE11A, MSH2, MSH6, MUTYH, NBN, NF1, PALB2, PMS2, PTEN, RAD50, RAD51C, RAD51D, SMARCA4, STK11, or TP53.   (2) status post bilateral mastectomies 08/10/2014 showing  (a) on the right, lobular carcinoma in situ  (b) on the left, a pT3 pN1, stage IIIA invasive lobular breast cancer, HER-2 repeated and again negative  (3) chemotherapy started 10/11/2014,consisting of doxorubicin and cyclophosphamide in dose dense fashion 4, completed 11/22/2014, followed by paclitaxel weekly 12 started 12/06/2014  (4) radiation to follow chemotherapy   (5) tamoxifen was started neoadjuvantly on 07/13/2014 given the likely delay in definitive surgery while genetics results are pending; it is being held during chemotherapy   PLAN: Lori [name] is having an odd pattern of neuropathy. I consulted with Dr. Jana Hakim, and he suggested holding treatment this week. She will start on a b-complex vitamin, and in the meantime I am having a vitamin b12 level drawn. Dr. Jana Hakim has suggested she continue on the valacyclovir daily for her cold sores despite her belief that it is ineffective.  Lori [name] will return in 1 week for  the restart of cycle 3. She understands and agrees with this plan. She knows the goal of treatment in her case is cure. She has been encouraged to call with any issues that might arise before her next visit here.    Laurie Panda, NP   12/20/2014 10:15 AM

## 2014-12-20 NOTE — Telephone Encounter (Signed)
Patient sent back to lab and given avs report and appointments. No other additional orders.

## 2014-12-27 ENCOUNTER — Ambulatory Visit: Payer: BLUE CROSS/BLUE SHIELD

## 2014-12-27 ENCOUNTER — Ambulatory Visit (HOSPITAL_BASED_OUTPATIENT_CLINIC_OR_DEPARTMENT_OTHER): Payer: BLUE CROSS/BLUE SHIELD

## 2014-12-27 ENCOUNTER — Telehealth: Payer: Self-pay | Admitting: Nurse Practitioner

## 2014-12-27 ENCOUNTER — Encounter: Payer: Self-pay | Admitting: Nurse Practitioner

## 2014-12-27 ENCOUNTER — Other Ambulatory Visit: Payer: BLUE CROSS/BLUE SHIELD

## 2014-12-27 ENCOUNTER — Encounter: Payer: Self-pay | Admitting: *Deleted

## 2014-12-27 ENCOUNTER — Ambulatory Visit (HOSPITAL_BASED_OUTPATIENT_CLINIC_OR_DEPARTMENT_OTHER): Payer: BLUE CROSS/BLUE SHIELD | Admitting: Nurse Practitioner

## 2014-12-27 ENCOUNTER — Other Ambulatory Visit (HOSPITAL_BASED_OUTPATIENT_CLINIC_OR_DEPARTMENT_OTHER): Payer: BLUE CROSS/BLUE SHIELD

## 2014-12-27 VITALS — BP 118/70 | HR 84 | Temp 98.0°F | Resp 18 | Ht 68.0 in | Wt 221.9 lb

## 2014-12-27 DIAGNOSIS — D0501 Lobular carcinoma in situ of right breast: Secondary | ICD-10-CM

## 2014-12-27 DIAGNOSIS — C50412 Malignant neoplasm of upper-outer quadrant of left female breast: Secondary | ICD-10-CM | POA: Diagnosis not present

## 2014-12-27 DIAGNOSIS — Z452 Encounter for adjustment and management of vascular access device: Secondary | ICD-10-CM

## 2014-12-27 DIAGNOSIS — Z5111 Encounter for antineoplastic chemotherapy: Secondary | ICD-10-CM | POA: Diagnosis not present

## 2014-12-27 DIAGNOSIS — Z170 Estrogen receptor positive status [ER+]: Secondary | ICD-10-CM | POA: Diagnosis not present

## 2014-12-27 DIAGNOSIS — Z95828 Presence of other vascular implants and grafts: Secondary | ICD-10-CM

## 2014-12-27 LAB — COMPREHENSIVE METABOLIC PANEL (CC13)
ALK PHOS: 92 U/L (ref 40–150)
ALT: 33 U/L (ref 0–55)
ANION GAP: 9 meq/L (ref 3–11)
AST: 21 U/L (ref 5–34)
Albumin: 3.9 g/dL (ref 3.5–5.0)
BILIRUBIN TOTAL: 0.33 mg/dL (ref 0.20–1.20)
BUN: 6.9 mg/dL — AB (ref 7.0–26.0)
CHLORIDE: 107 meq/L (ref 98–109)
CO2: 25 meq/L (ref 22–29)
CREATININE: 0.7 mg/dL (ref 0.6–1.1)
Calcium: 9.8 mg/dL (ref 8.4–10.4)
EGFR: >90 mL/min/{1.73_m2} (ref >90)
Glucose: 96 mg/dl (ref 70–140)
POTASSIUM: 4 meq/L (ref 3.5–5.1)
Sodium: 141 mEq/L (ref 136–145)
Total Protein: 7.1 g/dL (ref 6.4–8.3)

## 2014-12-27 LAB — CBC WITH DIFFERENTIAL/PLATELET
BASO%: 2.1 % — ABNORMAL HIGH (ref 0.0–2.0)
Basophils Absolute: 0.1 10*3/uL (ref 0.0–0.1)
EOS ABS: 0.3 10*3/uL (ref 0.0–0.5)
EOS%: 5.6 % (ref 0.0–7.0)
HCT: 40 % (ref 34.8–46.6)
HGB: 13.3 g/dL (ref 11.6–15.9)
LYMPH%: 20.5 % (ref 14.0–49.7)
MCH: 30 pg (ref 25.1–34.0)
MCHC: 33.2 g/dL (ref 31.5–36.0)
MCV: 90.5 fL (ref 79.5–101.0)
MONO#: 0.6 10*3/uL (ref 0.1–0.9)
MONO%: 11.3 % (ref 0.0–14.0)
NEUT#: 3.4 10*3/uL (ref 1.5–6.5)
NEUT%: 60.5 % (ref 38.4–76.8)
PLATELETS: 254 10*3/uL (ref 145–400)
RBC: 4.42 10*6/uL (ref 3.70–5.45)
RDW: 16.2 % — ABNORMAL HIGH (ref 11.2–14.5)
WBC: 5.7 10*3/uL (ref 3.9–10.3)
lymph#: 1.2 10*3/uL (ref 0.9–3.3)

## 2014-12-27 MED ORDER — SODIUM CHLORIDE 0.9 % IJ SOLN
10.0000 mL | INTRAMUSCULAR | Status: DC | PRN
  Administered 2014-12-27: 10 mL via INTRAVENOUS
  Filled 2014-12-27: qty 10

## 2014-12-27 MED ORDER — FAMOTIDINE IN NACL 20-0.9 MG/50ML-% IV SOLN
20.0000 mg | Freq: Once | INTRAVENOUS | Status: AC
Start: 2014-12-27 — End: 2014-12-27
  Administered 2014-12-27: 20 mg via INTRAVENOUS

## 2014-12-27 MED ORDER — HEPARIN SOD (PORK) LOCK FLUSH 100 UNIT/ML IV SOLN
500.0000 [IU] | Freq: Once | INTRAVENOUS | Status: AC | PRN
  Administered 2014-12-27: 500 [IU]
  Filled 2014-12-27: qty 5

## 2014-12-27 MED ORDER — DIPHENHYDRAMINE HCL 50 MG/ML IJ SOLN
INTRAMUSCULAR | Status: AC
  Filled 2014-12-27: qty 1

## 2014-12-27 MED ORDER — SODIUM CHLORIDE 0.9 % IJ SOLN
10.0000 mL | INTRAMUSCULAR | Status: DC | PRN
  Administered 2014-12-27: 10 mL
  Filled 2014-12-27: qty 10

## 2014-12-27 MED ORDER — PACLITAXEL CHEMO INJECTION 300 MG/50ML
80.0000 mg/m2 | Freq: Once | INTRAVENOUS | Status: AC
  Administered 2014-12-27: 174 mg via INTRAVENOUS
  Filled 2014-12-27: qty 29

## 2014-12-27 MED ORDER — DIPHENHYDRAMINE HCL 50 MG/ML IJ SOLN
25.0000 mg | Freq: Once | INTRAMUSCULAR | Status: AC
  Administered 2014-12-27: 25 mg via INTRAVENOUS

## 2014-12-27 MED ORDER — SODIUM CHLORIDE 0.9 % IV SOLN
Freq: Once | INTRAVENOUS | Status: AC
  Administered 2014-12-27: 14:00:00 via INTRAVENOUS

## 2014-12-27 MED ORDER — FAMOTIDINE IN NACL 20-0.9 MG/50ML-% IV SOLN
INTRAVENOUS | Status: AC
  Filled 2014-12-27: qty 50

## 2014-12-27 MED ORDER — SODIUM CHLORIDE 0.9 % IV SOLN
Freq: Once | INTRAVENOUS | Status: AC
  Administered 2014-12-27: 14:00:00 via INTRAVENOUS
  Filled 2014-12-27: qty 4

## 2014-12-27 MED ORDER — ALTEPLASE 2 MG IJ SOLR
2.0000 mg | Freq: Once | INTRAMUSCULAR | Status: AC | PRN
  Administered 2014-12-27: 2 mg
  Filled 2014-12-27: qty 2

## 2014-12-27 NOTE — Patient Instructions (Signed)

## 2014-12-27 NOTE — Telephone Encounter (Signed)
Appointments altered on 11/8 per pof and patient will get a new avs in chemo

## 2014-12-27 NOTE — Progress Notes (Signed)
Pt in for port-a-cath lab draw. Pt has had issues with getting blood return from port. Pt has had to have several doses of TPA in the past for treatment. Pt accessed with no difficulties, flushes well, approx 3cc of blood return noted at this time. Multiple manuevers attempted. Pt sent back to phlebotomist for lab draw and called and informed Diane, RN in infusion.

## 2014-12-27 NOTE — Patient Instructions (Signed)
[facility] Discharge Instructions for Patients Receiving Chemotherapy  Today you received the following chemotherapy agents:  Taxol  To help prevent nausea and vomiting after your treatment, we encourage you to take your nausea medication as prescribed.   If you develop nausea and vomiting that is not controlled by your nausea medication, call the clinic.   BELOW ARE SYMPTOMS THAT SHOULD BE REPORTED IMMEDIATELY:  *FEVER GREATER THAN 100.5 F  *CHILLS WITH OR WITHOUT FEVER  NAUSEA AND VOMITING THAT IS NOT CONTROLLED WITH YOUR NAUSEA MEDICATION  *UNUSUAL SHORTNESS OF BREATH  *UNUSUAL BRUISING OR BLEEDING  TENDERNESS IN MOUTH AND THROAT WITH OR WITHOUT PRESENCE OF ULCERS  *URINARY PROBLEMS  *BOWEL PROBLEMS  UNUSUAL RASH Items with * indicate a potential emergency and should be followed up as soon as possible.  Feel free to call the clinic you have any questions or concerns. The clinic phone number is [phone].  Please show the CHEMO ALERT CARD at check-in to the Emergency Department and triage nurse.   

## 2014-12-27 NOTE — Progress Notes (Signed)
[name]  Telephone:([phone]) [phone] Fax:(336) [phone]   ID: Lori [name] DOB: 1965/10/31  MR#: [id number]  VXY#:[id number]  Patient Care Team: Delilah Shan, MD as PCP - General (Family Medicine) [name], MD as Consulting Physician (General Surgery) [name], MD as Consulting Physician (Oncology) Lori Gibson, MD as Attending Physician (Radiation Oncology) [name], RN as Registered Nurse [name], RN as Registered Nurse [name], NP as Nurse Practitioner (Nurse Practitioner) PCP: Lori Simmer, MD GYN: Lori Ache MD OTHER MD:  CHIEF COMPLAINT: Estrogen receptor positive breast cancer  CURRENT TREATMENT: adjuvant chemotherapy  BREAST CANCER HISTORY: From the original intake note:  Lori [name] (who is the daughter of my former patient, Lori [name], herself now 10 years out from her T1 cN0 invasive ductal carcinoma, treated with anti-estrogens only) [facility] April 20 03/23/2014. Breast density was category C. A possible mass in the left breast upper outer quadrant was felt to warrant further evaluation, and on 07/04/2014 the patient underwent left mammography with tomosynthesis and left breast ultrasonography. At this point the patient felt she was able to palpate a mass in the area in question. Tomosynthesis did reveal an irregular mass in the upper outer left breast measuring 2.3 cm, associated on physical exam with a large area of thickening. Ultrasound of the area in question confirmed an irregular mass at the 1:00 position 5 cm from the nipple measuring 2.2 cm. The left axilla was negative sonographically.  Biopsy of the mass in question the same day, 07/04/2014, showed (SAA [id number]) an invasive lobular breast cancer which was estrogen receptor 50% positive with strong staining intensity, progesterone receptor 1% positive with moderate staining intensity, with a proliferation  marker of 1%, and no HER-2 amplification, the signals ratio being 1.19 and the number per cell 1.55.  On 07/08/2014 the patient underwent bilateral breast MRI. This showed no abnormal adenopathy and no abnormality in the right breast. In the left breast however there was a 9.3 area of abnormal enhancement involving all quadrants. Within the upper outer quadrant there was a denser area which measured up to 5.1 cm.  The patient's subsequent history is as detailed below  INTERVAL HISTORY: Lori [name] returns today for follow-up of her breast cancer accompanied by her husband,Lori [name]. Today she is due for cycle 3 of 12 planned cycles of weekly paclitaxel. Last week the dose was held because of neuropathy concerns, primarily to her face. She has regained feeling to her cheeks, but there is still a slight sensation change to the edge of her upper and lower lip. She no longer feels any difference to the fingertips on her right hand. She no longer has any cold sores and has stopped the valacyclovir.  REVIEW OF SYSTEMS: [name] is in good spirits today. The week off has boosted her energy level. She is sleeping better at night. She denies fevers, chills, nausea, vomiting, or changes in bowel or bladder habits. A detailed review of systems is otherwise stable.  PAST MEDICAL HISTORY: Past Medical History  Diagnosis Date  . Asthma     last trimester of pregnancy 3 yrs ago-  never had problems since then, no meds  . Breast cancer ([facility])   . Anxiety     PAST SURGICAL HISTORY: Past Surgical History  Procedure Laterality Date  . Cesarean section      x 2  . Knee arthroscopy Left 07/27/2013    Procedure: LEFT ARTHROSCOPY KNEE  WITH MEDIAL MENISCAL DEBRIDEMENT;  Surgeon: Gearlean Alf, MD;  Location: [facility];  Service: Orthopedics;  Laterality: Left;  Marland Kitchen Mastectomy w/ sentinel node biopsy Left 08/10/2014  . Mastectomy w/ sentinel node biopsy Bilateral 08/10/2014    Procedure: MASTECTOMY WITH SENTINEL LYMPH NODE BIOPSY;   Surgeon: [name], MD;  Location: [facility];  Service: General;  Laterality: Bilateral;  . Portacath placement Right 09/07/2014    Procedure: INSERTION PORT-A-CATH;  Surgeon: [name], MD;  Location: [facility];  Service: General;  Laterality: Right;    FAMILY HISTORY Family History  Problem Relation Age of Onset  . Breast cancer Mother 86  . Lymphoma Maternal Aunt 71  . Uterine cancer Paternal Aunt 18  . Uterine cancer Paternal Grandmother 67  . Ovarian cancer Other 63    mat great aunt through [facility]  . Ovarian cancer Maternal Grandmother 80  The patient's father died with congestive heart failure at the age of 87. The patient's mother is living, age 11. She was diagnosed with breast cancer at age 19. The patient's paternal grandmother was diagnosed with uterine cancer at age 35 and her daughter, the patient's paternal aunt, also with uterine cancer at age 68. On the mother's side one maternal aunt was diagnosed with lymphoma and the other with throat cancer. A maternal great aunt at age 36 was diagnosed with ovarian cancer.   GYNECOLOGIC HISTORY:  No LMP recorded.  menarche age 51, first live birth age 19, which the patient is aware double as the risk of breast cancer. She is GX P2. She still having regular periods. 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 Her husband RUTHIE BERCH the third (goes by "[name]") works in Engineer, technical sales, although currently he is looking for work. Their children are [name] 12 and [name] 3. The patient is not a church attender    ADVANCED DIRECTIVES: Not in place   HEALTH MAINTENANCE: Social History  Substance Use Topics  . Smoking status: Former Smoker -- 1.00 packs/day for 8 years    Types: Cigarettes    Quit date: 07/20/2000  . Smokeless tobacco: Never Used  . Alcohol Use: Yes     Comment: socially     Colonoscopy:  PAP:  Bone density:  Lipid panel:  No Known  Allergies  Current Outpatient Prescriptions  Medication Sig Dispense Refill  . Docusate Sodium (DULCOLAX STOOL SOFTENER PO) Take 3 tablets by mouth daily.     Marland Kitchen ibuprofen (ADVIL,MOTRIN) 200 MG tablet Take 600 mg by mouth every 6 (six) hours as needed.     . loratadine (CLARITIN) 10 MG tablet Take 10 mg by mouth daily.    Marland Kitchen LORazepam (ATIVAN) 0.5 MG tablet Take 1 tablet (0.5 mg total) by mouth at bedtime. (Patient not taking: Reported on 12/20/2014) 30 tablet 0  . polyethylene glycol (MIRALAX / GLYCOLAX) packet Take 17 g by mouth daily.    . Probiotic Product (ALIGN PO) Take 1 capsule by mouth daily.    . prochlorperazine (COMPAZINE) 10 MG tablet Take 1 tablet (10 mg total) by mouth every 6 (six) hours as needed (Nausea or vomiting). (Patient not taking: Reported on 12/20/2014) 30 tablet 3  . venlafaxine XR (EFFEXOR-XR) 75 MG 24 hr capsule Take 1 capsule (75 mg total) by mouth daily with breakfast. 90 capsule 3  . zolpidem (AMBIEN) 5 MG tablet Take 5 mg by mouth at bedtime as needed  for sleep.     No current facility-administered medications for this visit.   Facility-Administered Medications Ordered in Other Visits  Medication Dose Route Frequency Provider Last Rate Last Dose  . sodium chloride 0.9 % injection 10 mL  10 mL Intravenous PRN [name], MD   10 mL at 12/20/14 0908    OBJECTIVE: young White woman who appears stated age 49 Vitals:   12/27/14 1127  BP: 118/70  Pulse: 84  Temp: 98 F (36.7 C)  Resp: 18     Body mass index is 33.75 kg/(m^2).    ECOG FS:0 - Asymptomatic   Sclerae unicteric, pupils round and equal Oropharynx clear and moist-- no thrush or other lesions No cervical or supraclavicular adenopathy Lungs no rales or rhonchi Heart regular rate and rhythm Abd soft, nontender, positive bowel sounds MSK no focal spinal tenderness, no upper extremity lymphedema Neuro: nonfocal, well oriented, appropriate affect Breasts: deferred  LAB RESULTS:  CMP      Component Value Date/Time   NA 141 12/27/2014 1052   NA 139 08/03/2014 1022   K 4.0 12/27/2014 1052   K 4.1 08/03/2014 1022   CL 106 08/03/2014 1022   CO2 25 12/27/2014 1052   CO2 25 08/03/2014 1022   GLUCOSE 96 12/27/2014 1052   GLUCOSE 89 08/03/2014 1022   BUN 6.9* 12/27/2014 1052   BUN 10 08/03/2014 1022   CREATININE 0.7 12/27/2014 1052   CREATININE 0.68 08/03/2014 1022   CALCIUM 9.8 12/27/2014 1052   CALCIUM 9.3 08/03/2014 1022   PROT 7.1 12/27/2014 1052   ALBUMIN 3.9 12/27/2014 1052   AST 21 12/27/2014 1052   ALT 33 12/27/2014 1052   ALKPHOS 92 12/27/2014 1052   BILITOT 0.33 12/27/2014 1052   GFRNONAA >60 08/03/2014 1022   GFRAA >60 08/03/2014 1022    INo results found for: SPEP, UPEP  Lab Results  Component Value Date   WBC 5.7 12/27/2014   NEUTROABS 3.4 12/27/2014   HGB 13.3 12/27/2014   HCT 40.0 12/27/2014   MCV 90.5 12/27/2014   PLT 254 12/27/2014      Chemistry      Component Value Date/Time   NA 141 12/27/2014 1052   NA 139 08/03/2014 1022   K 4.0 12/27/2014 1052   K 4.1 08/03/2014 1022   CL 106 08/03/2014 1022   CO2 25 12/27/2014 1052   CO2 25 08/03/2014 1022   BUN 6.9* 12/27/2014 1052   BUN 10 08/03/2014 1022   CREATININE 0.7 12/27/2014 1052   CREATININE 0.68 08/03/2014 1022      Component Value Date/Time   CALCIUM 9.8 12/27/2014 1052   CALCIUM 9.3 08/03/2014 1022   ALKPHOS 92 12/27/2014 1052   AST 21 12/27/2014 1052   ALT 33 12/27/2014 1052   BILITOT 0.33 12/27/2014 1052       No results found for: LABCA2  No components found for: LABCA125  No results for input(s): INR in the last 168 hours.  Urinalysis No results found for: COLORURINE, APPEARANCEUR, LABSPEC, PHURINE, GLUCOSEU, HGBUR, BILIRUBINUR, KETONESUR, PROTEINUR, UROBILINOGEN, NITRITE, LEUKOCYTESUR  STUDIES: No results found.  ASSESSMENT: 49 y.o. BRCA negative [place] woman status post left breast biopsy 07/04/2014 for a clinical T3 N0, stage IIA invasive lobular  breast cancer, grade 2, estrogen receptor positive, progesterone receptor 1% "positive", with an MIB-1 of less than 5% and no HER-2 amplification  (1) genetics testing 07/29/2014 through the [employer] gene panel offered by [facility] found no deleterious mutations in ATM, BARD1,  BRCA1, BRCA2, BRIP1, CDH1, CHEK2, EPCAM, MLH1, MRE11A, MSH2, MSH6, MUTYH, NBN, NF1, PALB2, PMS2, PTEN, RAD50, RAD51C, RAD51D, SMARCA4, STK11, or TP53.   (2) status post bilateral mastectomies 08/10/2014 showing  (a) on the right, lobular carcinoma in situ  (b) on the left, a pT3 pN1, stage IIIA invasive lobular breast cancer, HER-2 repeated and again negative  (3) chemotherapy started 10/11/2014,consisting of doxorubicin and cyclophosphamide in dose dense fashion 4, completed 11/22/2014, followed by paclitaxel weekly 12 started 12/06/2014  (4) radiation to follow chemotherapy   (5) tamoxifen was started neoadjuvantly on 07/13/2014 given the likely delay in definitive surgery while genetics results are pending; it is being held during chemotherapy   PLAN: Yuval feels refreshed since having the past week off. The labs were reviewed in detail and were stable. Her neuropathy concerns have minimized, but we will still keep a close watch on them. She will proceed with cycle 3 of paclitaxel as planned.  Dell will return in 1 week for cycle 4. She understands and agrees with this plan. She knows the goal of treatment in her case is cure. She has been encouraged to call with any issues that might arise before her next visit here.    Laurie Panda, NP   12/27/2014 12:11 PM

## 2015-01-03 ENCOUNTER — Ambulatory Visit (HOSPITAL_BASED_OUTPATIENT_CLINIC_OR_DEPARTMENT_OTHER): Payer: BLUE CROSS/BLUE SHIELD

## 2015-01-03 ENCOUNTER — Other Ambulatory Visit (HOSPITAL_BASED_OUTPATIENT_CLINIC_OR_DEPARTMENT_OTHER): Payer: BLUE CROSS/BLUE SHIELD

## 2015-01-03 ENCOUNTER — Encounter: Payer: Self-pay | Admitting: *Deleted

## 2015-01-03 ENCOUNTER — Telehealth: Payer: Self-pay | Admitting: Nurse Practitioner

## 2015-01-03 ENCOUNTER — Ambulatory Visit: Payer: BLUE CROSS/BLUE SHIELD

## 2015-01-03 ENCOUNTER — Other Ambulatory Visit: Payer: BLUE CROSS/BLUE SHIELD

## 2015-01-03 ENCOUNTER — Encounter: Payer: Self-pay | Admitting: Oncology

## 2015-01-03 ENCOUNTER — Encounter: Payer: Self-pay | Admitting: Nurse Practitioner

## 2015-01-03 ENCOUNTER — Ambulatory Visit (HOSPITAL_BASED_OUTPATIENT_CLINIC_OR_DEPARTMENT_OTHER): Payer: BLUE CROSS/BLUE SHIELD | Admitting: Nurse Practitioner

## 2015-01-03 ENCOUNTER — Other Ambulatory Visit: Payer: Self-pay | Admitting: Oncology

## 2015-01-03 ENCOUNTER — Ambulatory Visit: Payer: BLUE CROSS/BLUE SHIELD | Admitting: Nurse Practitioner

## 2015-01-03 VITALS — BP 116/70 | HR 84 | Temp 98.3°F | Resp 18 | Ht 68.0 in | Wt 222.8 lb

## 2015-01-03 DIAGNOSIS — Z5111 Encounter for antineoplastic chemotherapy: Secondary | ICD-10-CM

## 2015-01-03 DIAGNOSIS — C50412 Malignant neoplasm of upper-outer quadrant of left female breast: Secondary | ICD-10-CM

## 2015-01-03 DIAGNOSIS — D0501 Lobular carcinoma in situ of right breast: Secondary | ICD-10-CM

## 2015-01-03 DIAGNOSIS — Z170 Estrogen receptor positive status [ER+]: Secondary | ICD-10-CM | POA: Diagnosis not present

## 2015-01-03 LAB — CBC WITH DIFFERENTIAL/PLATELET
BASO%: 0.2 % (ref 0.0–2.0)
Basophils Absolute: 0 10*3/uL (ref 0.0–0.1)
EOS%: 4.7 % (ref 0.0–7.0)
Eosinophils Absolute: 0.3 10*3/uL (ref 0.0–0.5)
HEMATOCRIT: 36.4 % (ref 34.8–46.6)
HGB: 12.1 g/dL (ref 11.6–15.9)
LYMPH#: 1.2 10*3/uL (ref 0.9–3.3)
LYMPH%: 21.3 % (ref 14.0–49.7)
MCH: 29.9 pg (ref 25.1–34.0)
MCHC: 33.2 g/dL (ref 31.5–36.0)
MCV: 89.9 fL (ref 79.5–101.0)
MONO#: 0.4 10*3/uL (ref 0.1–0.9)
MONO%: 7.5 % (ref 0.0–14.0)
NEUT#: 3.7 10*3/uL (ref 1.5–6.5)
NEUT%: 66.3 % (ref 38.4–76.8)
Platelets: 219 10*3/uL (ref 145–400)
RBC: 4.05 10*6/uL (ref 3.70–5.45)
RDW: 15.2 % — ABNORMAL HIGH (ref 11.2–14.5)
WBC: 5.7 10*3/uL (ref 3.9–10.3)

## 2015-01-03 LAB — COMPREHENSIVE METABOLIC PANEL (CC13)
ALBUMIN: 3.6 g/dL (ref 3.5–5.0)
ALK PHOS: 89 U/L (ref 40–150)
ALT: 35 U/L (ref 0–55)
AST: 23 U/L (ref 5–34)
Anion Gap: 7 mEq/L (ref 3–11)
BUN: 7.2 mg/dL (ref 7.0–26.0)
CALCIUM: 9.6 mg/dL (ref 8.4–10.4)
CO2: 26 mEq/L (ref 22–29)
CREATININE: 0.7 mg/dL (ref 0.6–1.1)
Chloride: 107 mEq/L (ref 98–109)
EGFR: >90 mL/min/{1.73_m2} (ref >90)
Glucose: 101 mg/dl (ref 70–140)
Potassium: 4 mEq/L (ref 3.5–5.1)
Sodium: 140 mEq/L (ref 136–145)
TOTAL PROTEIN: 6.7 g/dL (ref 6.4–8.3)
Total Bilirubin: <0.3 mg/dL (ref 0.20–1.20)

## 2015-01-03 MED ORDER — SODIUM CHLORIDE 0.9 % IJ SOLN
10.0000 mL | INTRAMUSCULAR | Status: DC | PRN
Start: 2015-01-03 — End: 2015-01-03
  Administered 2015-01-03: 10 mL via INTRAVENOUS
  Filled 2015-01-03: qty 10

## 2015-01-03 MED ORDER — SODIUM CHLORIDE 0.9 % IV SOLN
Freq: Once | INTRAVENOUS | Status: AC
  Administered 2015-01-03: 13:00:00 via INTRAVENOUS

## 2015-01-03 MED ORDER — SODIUM CHLORIDE 0.9 % IJ SOLN
10.0000 mL | INTRAMUSCULAR | Status: DC | PRN
  Administered 2015-01-03: 10 mL
  Filled 2015-01-03: qty 10

## 2015-01-03 MED ORDER — SODIUM CHLORIDE 0.9 % IV SOLN
Freq: Once | INTRAVENOUS | Status: AC
  Administered 2015-01-03: 13:00:00 via INTRAVENOUS
  Filled 2015-01-03: qty 4

## 2015-01-03 MED ORDER — PACLITAXEL CHEMO INJECTION 300 MG/50ML
80.0000 mg/m2 | Freq: Once | INTRAVENOUS | Status: AC
  Administered 2015-01-03: 174 mg via INTRAVENOUS
  Filled 2015-01-03: qty 29

## 2015-01-03 MED ORDER — DIPHENHYDRAMINE HCL 50 MG/ML IJ SOLN
INTRAMUSCULAR | Status: AC
  Filled 2015-01-03: qty 1

## 2015-01-03 MED ORDER — FAMOTIDINE IN NACL 20-0.9 MG/50ML-% IV SOLN
20.0000 mg | Freq: Once | INTRAVENOUS | Status: AC
  Administered 2015-01-03: 20 mg via INTRAVENOUS

## 2015-01-03 MED ORDER — HEPARIN SOD (PORK) LOCK FLUSH 100 UNIT/ML IV SOLN
500.0000 [IU] | Freq: Once | INTRAVENOUS | Status: AC | PRN
  Administered 2015-01-03: 500 [IU]
  Filled 2015-01-03: qty 5

## 2015-01-03 MED ORDER — DIPHENHYDRAMINE HCL 50 MG/ML IJ SOLN
25.0000 mg | Freq: Once | INTRAMUSCULAR | Status: AC
  Administered 2015-01-03: 25 mg via INTRAVENOUS

## 2015-01-03 MED ORDER — FAMOTIDINE IN NACL 20-0.9 MG/50ML-% IV SOLN
INTRAVENOUS | Status: AC
  Filled 2015-01-03: qty 50

## 2015-01-03 NOTE — Telephone Encounter (Signed)
Appointments added per pof and patient will get a new avs in chemo °

## 2015-01-03 NOTE — Progress Notes (Signed)
I faxed notes 11/03/14 present to [employer]

## 2015-01-03 NOTE — Patient Instructions (Signed)
[facility] Discharge Instructions for Patients Receiving Chemotherapy  Today you received the following chemotherapy agents taxol  To help prevent nausea and vomiting after your treatment, we encourage you to take your nausea medication    If you develop nausea and vomiting that is not controlled by your nausea medication, call the clinic.   BELOW ARE SYMPTOMS THAT SHOULD BE REPORTED IMMEDIATELY:  *FEVER GREATER THAN 100.5 F  *CHILLS WITH OR WITHOUT FEVER  NAUSEA AND VOMITING THAT IS NOT CONTROLLED WITH YOUR NAUSEA MEDICATION  *UNUSUAL SHORTNESS OF BREATH  *UNUSUAL BRUISING OR BLEEDING  TENDERNESS IN MOUTH AND THROAT WITH OR WITHOUT PRESENCE OF ULCERS  *URINARY PROBLEMS  *BOWEL PROBLEMS  UNUSUAL RASH Items with * indicate a potential emergency and should be followed up as soon as possible.  Feel free to call the clinic you have any questions or concerns. The clinic phone number is [phone].  Please show the CHEMO ALERT CARD at check-in to the Emergency Department and triage nurse.   

## 2015-01-03 NOTE — Progress Notes (Signed)
Lori [name]  [phone]) [phone] Fax:([phone]) [phone]   ID: Lori [name] DOB: 12/27/1965  MR#: [id number]  ZWC#:[id number]  Patient Care Team: Delilah Shan, MD as PCP - General (Family Medicine) [name], MD as Consulting Physician (General Surgery) [name], MD as Consulting Physician (Oncology) [name], MD as Attending Physician (Radiation Oncology) [name], RN as Registered Nurse [name], RN as Registered Nurse [name], NP as Nurse Practitioner (Nurse Practitioner) PCP: [name], MD GYN: [name] MD OTHER MD:  CHIEF COMPLAINT: Estrogen receptor positive breast cancer  CURRENT TREATMENT: adjuvant chemotherapy  BREAST CANCER HISTORY: From the original intake note:  Lori [name] (who is the daughter of my former patient, Lori [name], herself now 10 years out from her T1 cN0 invasive ductal carcinoma, treated with anti-estrogens only) [facility] April 20 03/23/2014. Breast density was category C. A possible mass in the left breast upper outer quadrant was felt to warrant further evaluation, and on 07/04/2014 the patient underwent left mammography with tomosynthesis and left breast ultrasonography. At this point the patient felt she was able to palpate a mass in the area in question. Tomosynthesis did reveal an irregular mass in the upper outer left breast measuring 2.3 cm, associated on physical exam with a large area of thickening. Ultrasound of the area in question confirmed an irregular mass at the 1:00 position 5 cm from the nipple measuring 2.2 cm. The left axilla was negative sonographically.  Biopsy of the mass in question the same day, 07/04/2014, showed ([phone]) an invasive lobular breast cancer which was estrogen receptor 50% positive with strong staining intensity, progesterone receptor 1% positive with moderate staining intensity, with a proliferation  marker of 1%, and no HER-2 amplification, the signals ratio being 1.19 and the number per cell 1.55.  On 07/08/2014 the patient underwent bilateral breast MRI. This showed no abnormal adenopathy and no abnormality in the right breast. In the left breast however there was a 9.3 area of abnormal enhancement involving all quadrants. Within the upper outer quadrant there was a denser area which measured up to 5.1 cm.  The patient's subsequent history is as detailed below  INTERVAL HISTORY: Lori [name] returns today for follow-up of her breast cancer, alone. Today she is due for cycle 4 of 12 planned cycles of weekly paclitaxel. She had no new episodes of numbness to her face this week. The same areas, mid upper and lower lips, are unchanged.  REVIEW OF SYSTEMS: Lori [name] denies fevers, chills, nausea, or vomiting. She is moving her bowels well on stool softeners alone. She has no true mouth sores but her gums are irritated and red again. Magic mouthwash is helpful, but it does not last very long. Her jaw is sore. She believes she has been grinding her teeth at night. She is constantly fatigued. She endorses anxiety and depression not only about her cancer, but financially as well. A detailed review of systems is otherwise stable.  PAST MEDICAL HISTORY: Past Medical History  Diagnosis Date  . Asthma     last trimester of pregnancy 3 yrs ago-  never had problems since then, no meds  . Breast cancer ([place])   . Anxiety     PAST SURGICAL HISTORY: Past Surgical History  Procedure Laterality Date  . Cesarean section      x 2  . Knee arthroscopy Left 07/27/2013    Procedure: LEFT ARTHROSCOPY KNEE WITH MEDIAL MENISCAL  DEBRIDEMENT;  Surgeon: Gearlean Alf, MD;  Location: [facility];  Service: Orthopedics;  Laterality: Left;  Marland Kitchen Mastectomy w/ sentinel node biopsy Left 08/10/2014  . Mastectomy w/ sentinel node biopsy Bilateral 08/10/2014    Procedure: MASTECTOMY WITH SENTINEL LYMPH NODE BIOPSY;  Surgeon: [name],  MD;  Location: [facility];  Service: General;  Laterality: Bilateral;  . Portacath placement Right 09/07/2014    Procedure: INSERTION PORT-A-CATH;  Surgeon: [name], MD;  Location: [facility];  Service: General;  Laterality: Right;    FAMILY HISTORY Family History  Problem Relation Age of Onset  . Breast cancer Mother 65  . Lymphoma Maternal Aunt 71  . Uterine cancer Paternal Aunt 84  . Uterine cancer Paternal Grandmother 65  . Ovarian cancer Other 71    mat great aunt through [facility]  . Ovarian cancer Maternal Grandmother 58  The patient's father died with congestive heart failure at the age of 35. The patient's mother is living, age 62. She was diagnosed with breast cancer at age 109. The patient's paternal grandmother was diagnosed with uterine cancer at age 30 and her daughter, the patient's paternal aunt, also with uterine cancer at age 48. On the mother's side one maternal aunt was diagnosed with lymphoma and the other with throat cancer. A maternal great aunt at age 57 was diagnosed with ovarian cancer.   GYNECOLOGIC HISTORY:  No LMP recorded.  menarche age 16, first live birth age 11, which the patient is aware double as the risk of breast cancer. She is GX P2. She still having regular periods. 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 Her husband Lori [name] the third (goes by "[name]") works in Engineer, technical sales, although currently he is looking for work. Their children are Lori [name] 12 and Lori [name] 3. The patient is not a church attender    ADVANCED DIRECTIVES: Not in place   HEALTH MAINTENANCE: Social History  Substance Use Topics  . Smoking status: Former Smoker -- 1.00 packs/day for 8 years    Types: Cigarettes    Quit date: 07/20/2000  . Smokeless tobacco: Never Used  . Alcohol Use: Yes     Comment: socially     Colonoscopy:  PAP:  Bone density:  Lipid panel:  No Known Allergies  Current  Outpatient Prescriptions  Medication Sig Dispense Refill  . Docusate Sodium (DULCOLAX STOOL SOFTENER PO) Take 3 tablets by mouth daily.     Marland Kitchen ibuprofen (ADVIL,MOTRIN) 200 MG tablet Take 600 mg by mouth every 6 (six) hours as needed.     . loratadine (CLARITIN) 10 MG tablet Take 10 mg by mouth daily.    . Probiotic Product (ALIGN PO) Take 1 capsule by mouth daily.    Marland Kitchen venlafaxine XR (EFFEXOR-XR) 75 MG 24 hr capsule Take 1 capsule (75 mg total) by mouth daily with breakfast. 90 capsule 3  . LORazepam (ATIVAN) 0.5 MG tablet Take 1 tablet (0.5 mg total) by mouth at bedtime. (Patient not taking: Reported on 12/20/2014) 30 tablet 0  . polyethylene glycol (MIRALAX / GLYCOLAX) packet Take 17 g by mouth daily.    . prochlorperazine (COMPAZINE) 10 MG tablet Take 1 tablet (10 mg total) by mouth every 6 (six) hours as needed (Nausea or vomiting). (Patient not taking: Reported on 12/20/2014) 30 tablet 3  . zolpidem (AMBIEN) 5 MG tablet Take 5 mg by mouth at bedtime as needed for sleep.  No current facility-administered medications for this visit.   Facility-Administered Medications Ordered in Other Visits  Medication Dose Route Frequency Provider Last Rate Last Dose  . sodium chloride 0.9 % injection 10 mL  10 mL Intravenous PRN [name], MD   10 mL at 12/20/14 0908    OBJECTIVE: young White woman who appears stated age 49 Vitals:   01/03/15 1113  BP: 116/70  Pulse: 84  Temp: 98.3 F (36.8 C)  Resp: 18     Body mass index is 33.88 kg/(m^2).    ECOG FS:0 - Asymptomatic   Skin: warm, dry  HEENT: sclerae anicteric, conjunctivae pink, oropharynx clear. No thrush or mucositis.  Lymph Nodes: No cervical or supraclavicular lymphadenopathy  Lungs: clear to auscultation bilaterally, no rales, wheezes, or rhonci  Heart: regular rate and rhythm  Abdomen: round, soft, non tender, positive bowel sounds  Musculoskeletal: No focal spinal tenderness, no peripheral edema  Neuro: non focal, well  oriented, positive affect  Breasts: deferred  LAB RESULTS:  CMP     Component Value Date/Time   NA 140 01/03/2015 1014   NA 139 08/03/2014 1022   K 4.0 01/03/2015 1014   K 4.1 08/03/2014 1022   CL 106 08/03/2014 1022   CO2 26 01/03/2015 1014   CO2 25 08/03/2014 1022   GLUCOSE 101 01/03/2015 1014   GLUCOSE 89 08/03/2014 1022   BUN 7.2 01/03/2015 1014   BUN 10 08/03/2014 1022   CREATININE 0.7 01/03/2015 1014   CREATININE 0.68 08/03/2014 1022   CALCIUM 9.6 01/03/2015 1014   CALCIUM 9.3 08/03/2014 1022   PROT 6.7 01/03/2015 1014   ALBUMIN 3.6 01/03/2015 1014   AST 23 01/03/2015 1014   ALT 35 01/03/2015 1014   ALKPHOS 89 01/03/2015 1014   BILITOT <0.30 01/03/2015 1014   GFRNONAA >60 08/03/2014 1022   GFRAA >60 08/03/2014 1022    INo results found for: SPEP, UPEP  Lab Results  Component Value Date   WBC 5.7 01/03/2015   NEUTROABS 3.7 01/03/2015   HGB 12.1 01/03/2015   HCT 36.4 01/03/2015   MCV 89.9 01/03/2015   PLT 219 01/03/2015      Chemistry      Component Value Date/Time   NA 140 01/03/2015 1014   NA 139 08/03/2014 1022   K 4.0 01/03/2015 1014   K 4.1 08/03/2014 1022   CL 106 08/03/2014 1022   CO2 26 01/03/2015 1014   CO2 25 08/03/2014 1022   BUN 7.2 01/03/2015 1014   BUN 10 08/03/2014 1022   CREATININE 0.7 01/03/2015 1014   CREATININE 0.68 08/03/2014 1022      Component Value Date/Time   CALCIUM 9.6 01/03/2015 1014   CALCIUM 9.3 08/03/2014 1022   ALKPHOS 89 01/03/2015 1014   AST 23 01/03/2015 1014   ALT 35 01/03/2015 1014   BILITOT <0.30 01/03/2015 1014       No results found for: LABCA2  No components found for: LABCA125  No results for input(s): INR in the last 168 hours.  Urinalysis No results found for: COLORURINE, APPEARANCEUR, LABSPEC, PHURINE, GLUCOSEU, HGBUR, BILIRUBINUR, KETONESUR, PROTEINUR, UROBILINOGEN, NITRITE, LEUKOCYTESUR  STUDIES: No results found.  ASSESSMENT: 49 y.o. BRCA negative [place] woman status post left  breast biopsy 07/04/2014 for a clinical T3 N0, stage IIA invasive lobular breast cancer, grade 2, estrogen receptor positive, progesterone receptor 1% "positive", with an MIB-1 of less than 5% and no HER-2 amplification  (1) genetics testing 07/29/2014 through the [facility] gene panel offered  by Al[facility]orts found no deleterious mutations in ATM, BARD1, BRCA1, BRCA2, BRIP1, CDH1, CHEK2, EPCAM, MLH1, MRE11A, MSH2, MSH6, MUTYH, NBN, NF1, PALB2, PMS2, PTEN, RAD50, RAD51C, RAD51D, SMARCA4, STK11, or TP53.   (2) status post bilateral mastectomies 08/10/2014 showing  (a) on the right, lobular carcinoma in situ  (b) on the left, a pT3 pN1, stage IIIA invasive lobular breast cancer, HER-2 repeated and again negative  (3) chemotherapy started 10/11/2014,consisting of doxorubicin and cyclophosphamide in dose dense fashion 4, completed 11/22/2014, followed by paclitaxel weekly 12 started 12/06/2014  (4) radiation to follow chemotherapy   (5) tamoxifen was started neoadjuvantly on 07/13/2014 given the likely delay in definitive surgery while genetics results are pending; it is being held during chemotherapy   PLAN: Garnell is stressed because of finances this week, and is rightfully fatigued. Otherwise, she is not presenting with any new symptoms this week. She will proceed with cycle 4 of paclitaxel as planned. She has asked to have the 23m dexamethasone and 0.55mdose of ativan removed from her premeds this time. She did well without the ativan last week and is driving on her own this afternoon and has never been a big fan of steroids in the past as they cause palpitations.  She is tearful this week during our visit because about stress regarding her financial situation. 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 She would prefer to stay on long term disability through February while she is on daily radiation, however.  I told her we could write a note to allow for this when she is ready. She will speak with her case worker this week.  [place] return in 1 week for cycle 5 of treatment. She understands and agrees with this plan. She knows the goal of treatment in her case is cure. She has been encouraged to call with any issues that might arise before her next visit here.    HeLaurie PandaNP   01/03/2015 12:14 PM

## 2015-01-09 ENCOUNTER — Telehealth: Payer: Self-pay | Admitting: Genetic Counselor

## 2015-01-09 NOTE — Telephone Encounter (Signed)
Ms. 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  Sounds like she received an EOB from [place] about the cost of genetic testing that she had earlier this year.  We discussed that, since she never received a call from [facility], that she won't have to pay more than $100 OOP.  When/if she ultimately gets a bill, that bill will be from [facility]. She did say she also received a check from [facility] for the part they were planning to pay.  We discussed that she may need to send this to Cephus Shelling if they have not received it from [place].  I gave her [facility]'s number so that she can ask them to be sure.  I also gave her my direct office number so she can call me with additional questions in the future.

## 2015-01-10 ENCOUNTER — Encounter: Payer: Self-pay | Admitting: Physician Assistant

## 2015-01-10 ENCOUNTER — Other Ambulatory Visit (HOSPITAL_BASED_OUTPATIENT_CLINIC_OR_DEPARTMENT_OTHER): Payer: BLUE CROSS/BLUE SHIELD

## 2015-01-10 ENCOUNTER — Ambulatory Visit: Payer: BLUE CROSS/BLUE SHIELD

## 2015-01-10 ENCOUNTER — Encounter: Payer: Self-pay | Admitting: *Deleted

## 2015-01-10 ENCOUNTER — Ambulatory Visit (HOSPITAL_BASED_OUTPATIENT_CLINIC_OR_DEPARTMENT_OTHER): Payer: BLUE CROSS/BLUE SHIELD

## 2015-01-10 ENCOUNTER — Ambulatory Visit (HOSPITAL_BASED_OUTPATIENT_CLINIC_OR_DEPARTMENT_OTHER): Payer: BLUE CROSS/BLUE SHIELD | Admitting: Physician Assistant

## 2015-01-10 ENCOUNTER — Other Ambulatory Visit: Payer: BLUE CROSS/BLUE SHIELD

## 2015-01-10 VITALS — BP 111/66 | HR 87 | Temp 98.3°F | Resp 20 | Ht 68.0 in | Wt 222.8 lb

## 2015-01-10 DIAGNOSIS — D0501 Lobular carcinoma in situ of right breast: Secondary | ICD-10-CM

## 2015-01-10 DIAGNOSIS — Z95828 Presence of other vascular implants and grafts: Secondary | ICD-10-CM

## 2015-01-10 DIAGNOSIS — F329 Major depressive disorder, single episode, unspecified: Secondary | ICD-10-CM

## 2015-01-10 DIAGNOSIS — Z5111 Encounter for antineoplastic chemotherapy: Secondary | ICD-10-CM

## 2015-01-10 DIAGNOSIS — Z170 Estrogen receptor positive status [ER+]: Secondary | ICD-10-CM

## 2015-01-10 DIAGNOSIS — M25562 Pain in left knee: Secondary | ICD-10-CM

## 2015-01-10 DIAGNOSIS — K1230 Oral mucositis (ulcerative), unspecified: Secondary | ICD-10-CM

## 2015-01-10 DIAGNOSIS — R200 Anesthesia of skin: Secondary | ICD-10-CM

## 2015-01-10 DIAGNOSIS — C50412 Malignant neoplasm of upper-outer quadrant of left female breast: Secondary | ICD-10-CM

## 2015-01-10 DIAGNOSIS — R208 Other disturbances of skin sensation: Secondary | ICD-10-CM | POA: Diagnosis not present

## 2015-01-10 DIAGNOSIS — F32A Depression, unspecified: Secondary | ICD-10-CM

## 2015-01-10 DIAGNOSIS — M25561 Pain in right knee: Secondary | ICD-10-CM

## 2015-01-10 LAB — CBC WITH DIFFERENTIAL/PLATELET
BASO%: 1.2 % (ref 0.0–2.0)
BASOS ABS: 0.1 10*3/uL (ref 0.0–0.1)
EOS ABS: 0.2 10*3/uL (ref 0.0–0.5)
EOS%: 5.4 % (ref 0.0–7.0)
HEMATOCRIT: 36.6 % (ref 34.8–46.6)
HEMOGLOBIN: 12.5 g/dL (ref 11.6–15.9)
LYMPH#: 1.2 10*3/uL (ref 0.9–3.3)
LYMPH%: 26.1 % (ref 14.0–49.7)
MCH: 30.5 pg (ref 25.1–34.0)
MCHC: 34.2 g/dL (ref 31.5–36.0)
MCV: 89.1 fL (ref 79.5–101.0)
MONO#: 0.3 10*3/uL (ref 0.1–0.9)
MONO%: 7.7 % (ref 0.0–14.0)
NEUT#: 2.7 10*3/uL (ref 1.5–6.5)
NEUT%: 59.6 % (ref 38.4–76.8)
Platelets: 247 10*3/uL (ref 145–400)
RBC: 4.11 10*6/uL (ref 3.70–5.45)
RDW: 15.3 % — AB (ref 11.2–14.5)
WBC: 4.5 10*3/uL (ref 3.9–10.3)

## 2015-01-10 LAB — COMPREHENSIVE METABOLIC PANEL (CC13)
ALBUMIN: 3.7 g/dL (ref 3.5–5.0)
ALK PHOS: 93 U/L (ref 40–150)
ALT: 49 U/L (ref 0–55)
AST: 31 U/L (ref 5–34)
Anion Gap: 8 mEq/L (ref 3–11)
BUN: 11.1 mg/dL (ref 7.0–26.0)
CALCIUM: 9.9 mg/dL (ref 8.4–10.4)
CO2: 25 mEq/L (ref 22–29)
Chloride: 107 mEq/L (ref 98–109)
Creatinine: 0.7 mg/dL (ref 0.6–1.1)
Glucose: 92 mg/dl (ref 70–140)
POTASSIUM: 4.2 meq/L (ref 3.5–5.1)
Sodium: 140 mEq/L (ref 136–145)
Total Bilirubin: 0.31 mg/dL (ref 0.20–1.20)
Total Protein: 6.8 g/dL (ref 6.4–8.3)

## 2015-01-10 MED ORDER — DIPHENHYDRAMINE HCL 50 MG/ML IJ SOLN
25.0000 mg | Freq: Once | INTRAMUSCULAR | Status: AC
  Administered 2015-01-10: 25 mg via INTRAVENOUS

## 2015-01-10 MED ORDER — SODIUM CHLORIDE 0.9 % IJ SOLN
10.0000 mL | INTRAMUSCULAR | Status: DC | PRN
  Administered 2015-01-10: 10 mL
  Filled 2015-01-10: qty 10

## 2015-01-10 MED ORDER — SODIUM CHLORIDE 0.9 % IJ SOLN
10.0000 mL | INTRAMUSCULAR | Status: DC | PRN
  Administered 2015-01-10: 10 mL via INTRAVENOUS
  Filled 2015-01-10: qty 10

## 2015-01-10 MED ORDER — HEPARIN SOD (PORK) LOCK FLUSH 100 UNIT/ML IV SOLN
500.0000 [IU] | Freq: Once | INTRAVENOUS | Status: AC | PRN
  Administered 2015-01-10: 500 [IU]
  Filled 2015-01-10: qty 5

## 2015-01-10 MED ORDER — SODIUM CHLORIDE 0.9 % IV SOLN
Freq: Once | INTRAVENOUS | Status: AC
  Administered 2015-01-10: 12:00:00 via INTRAVENOUS

## 2015-01-10 MED ORDER — DEXTROSE 5 % IV SOLN
80.0000 mg/m2 | Freq: Once | INTRAVENOUS | Status: AC
  Administered 2015-01-10: 174 mg via INTRAVENOUS
  Filled 2015-01-10: qty 29

## 2015-01-10 MED ORDER — DIPHENHYDRAMINE HCL 50 MG/ML IJ SOLN
INTRAMUSCULAR | Status: AC
  Filled 2015-01-10: qty 1

## 2015-01-10 MED ORDER — SODIUM CHLORIDE 0.9 % IV SOLN
Freq: Once | INTRAVENOUS | Status: DC
  Filled 2015-01-10: qty 4

## 2015-01-10 MED ORDER — FAMOTIDINE IN NACL 20-0.9 MG/50ML-% IV SOLN
INTRAVENOUS | Status: AC
  Filled 2015-01-10: qty 50

## 2015-01-10 MED ORDER — FAMOTIDINE IN NACL 20-0.9 MG/50ML-% IV SOLN
20.0000 mg | Freq: Once | INTRAVENOUS | Status: AC
  Administered 2015-01-10: 20 mg via INTRAVENOUS

## 2015-01-10 MED ORDER — SODIUM CHLORIDE 0.9 % IV SOLN
Freq: Once | INTRAVENOUS | Status: AC
  Administered 2015-01-10: 13:00:00 via INTRAVENOUS
  Filled 2015-01-10: qty 4

## 2015-01-10 NOTE — Patient Instructions (Signed)

## 2015-01-10 NOTE — Patient Instructions (Signed)
[facility] Discharge Instructions for Patients Receiving Chemotherapy  Today you received the following chemotherapy agents Taxol   To help prevent nausea and vomiting after your treatment, we encourage you to take your nausea medication as directed.   If you develop nausea and vomiting that is not controlled by your nausea medication, call the clinic.   BELOW ARE SYMPTOMS THAT SHOULD BE REPORTED IMMEDIATELY:  *FEVER GREATER THAN 100.5 F  *CHILLS WITH OR WITHOUT FEVER  NAUSEA AND VOMITING THAT IS NOT CONTROLLED WITH YOUR NAUSEA MEDICATION  *UNUSUAL SHORTNESS OF BREATH  *UNUSUAL BRUISING OR BLEEDING  TENDERNESS IN MOUTH AND THROAT WITH OR WITHOUT PRESENCE OF ULCERS  *URINARY PROBLEMS  *BOWEL PROBLEMS  UNUSUAL RASH Items with * indicate a potential emergency and should be followed up as soon as possible.  Feel free to call the clinic you have any questions or concerns. The clinic phone number is [phone].  Please show the CHEMO ALERT CARD at check-in to the Emergency Department and triage nurse.   

## 2015-01-10 NOTE — Progress Notes (Signed)
[facility]  [phone] [phone]   ID: Lori [name] DOB: 08/07/1965  MR#: [id number]  CSN#:[id number]  Patient Care Team: Samuel S Kelly, MD as PCP - General (Family Medicine) Paul Toth III, MD as Consulting Physician (General Surgery) Gustav C Magrinat, MD as Consulting Physician (Oncology) Sarah Squire, MD as Attending Physician (Radiation Oncology) Keisha N Martini, RN as Registered Nurse Dawn C Stuart, RN as Registered Nurse Gretchen W Dawson, NP as Nurse Practitioner (Nurse Practitioner) PCP: KELLY,SAM, MD GYN: Marie Lynn Lavoie MD OTHER MD:  CHIEF COMPLAINT: Estrogen receptor positive breast cancer  CURRENT TREATMENT: adjuvant chemotherapy  BREAST CANCER HISTORY: From the original intake note:  Lori [name] (who is the daughter of my former patient, Faye Callicutt, herself now 10 years out from her T1 cN0 invasive ductal carcinoma, treated with anti-estrogens only) [facility] April 20 03/23/2014. Breast density was category C. A possible mass in the left breast upper outer quadrant was felt to warrant further evaluation, and on 07/04/2014 the patient underwent left mammography with tomosynthesis and left breast ultrasonography. At this point the patient felt she was able to palpate a mass in the area in question. Tomosynthesis did reveal an irregular mass in the upper outer left breast measuring 2.3 cm, associated on physical exam with a large area of thickening. Ultrasound of the area in question confirmed an irregular mass at the 1:00 position 5 cm from the nipple measuring 2.2 cm. The left axilla was negative sonographically.  Biopsy of the mass in question the same day, 07/04/2014, showed ([id number]) an invasive lobular breast cancer which was estrogen receptor 50% positive with strong staining intensity, progesterone receptor 1% positive with moderate staining intensity, with a proliferation  marker of 1%, and no HER-2 amplification, the signals ratio being 1.19 and the number per cell 1.55.  On 07/08/2014 the patient underwent bilateral breast MRI. This showed no abnormal adenopathy and no abnormality in the right breast. In the left breast however there was a 9.3 area of abnormal enhancement involving all quadrants. Within the upper outer quadrant there was a denser area which measured up to 5.1 cm.  The patient's subsequent history is as detailed below  INTERVAL HISTORY: Lori [name] returns today for follow-up of her breast cancer, alone. Today she is due for cycle 5 of 12 planned cycles of weekly paclitaxel. She had no new episodes of numbness to her face this week. The same areas, mid upper and lower lips, are unchanged. She also reports known mucositis treated with magic mouthwash with some relief.  REVIEW OF SYSTEMS: Leaner denies fevers, chills, nausea, or vomiting. She is moving her bowels well on stool softeners alone. Denies jaw pain. She is constantly fatigued. A detailed review of systems is otherwise stable.  PAST MEDICAL HISTORY: Past Medical History  Diagnosis Date  . Asthma     last trimester of pregnancy 3 yrs ago-  never had problems since then, no meds  . Breast cancer (HCC)   . Anxiety     PAST SURGICAL HISTORY: Past Surgical History  Procedure Laterality Date  . Cesarean section      x 2  . Knee arthroscopy Left 07/27/2013    Procedure: LEFT ARTHROSCOPY KNEE WITH MEDIAL MENISCAL DEBRIDEMENT;  Surgeon: Frank Aluisio V, MD;  Location: [facility];  Service: Orthopedics;  Laterality: Left;  . Mastectomy w/ sentinel node biopsy Left 08/10/2014  . Mastectomy w/ sentinel node biopsy Bilateral 08/10/2014      Procedure: MASTECTOMY WITH SENTINEL LYMPH NODE BIOPSY;  Surgeon: Paul Toth III, MD;  Location: [facility];  Service: General;  Laterality: Bilateral;  . Portacath placement Right 09/07/2014    Procedure: INSERTION PORT-A-CATH;  Surgeon: Paul Toth III, MD;  Location: [facility];  Service: General;  Laterality: Right;    FAMILY HISTORY Family History  Problem Relation Age of Onset  . Breast cancer Mother 64  . Lymphoma Maternal Aunt 71  . Uterine cancer Paternal Aunt 58  . Uterine cancer Paternal Grandmother 77  . Ovarian cancer Other 78    mat great aunt through MGM  . Ovarian cancer Maternal Grandmother 97  The patient's father died with congestive heart failure at the age of 66. The patient's mother is living, age 74. She was diagnosed with breast cancer at age 64. The patient's paternal grandmother was diagnosed with uterine cancer at age 77 and her daughter, the patient's paternal aunt, also with uterine cancer at age 58. On the mother's side one maternal aunt was diagnosed with lymphoma and the other with throat cancer. A maternal great aunt at age 78 was diagnosed with ovarian cancer.   GYNECOLOGIC HISTORY:  No LMP recorded.  menarche age 14, first live birth age 35, which the patient is aware double as the risk of breast cancer. She is GX P2. She still having regular periods. 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 Her husband Sam B [name] the third (goes by "Trey") works in IT, although currently he is looking for work. Their children are Rachel 12 and [name] 3. The patient is not a church attender    ADVANCED DIRECTIVES: Not in place   HEALTH MAINTENANCE: Social History  Substance Use Topics  . Smoking status: Former Smoker -- 1.00 packs/day for 8 years    Types: Cigarettes    Quit date: 07/20/2000  . Smokeless tobacco: Never Used  . Alcohol Use: Yes     Comment: socially     Colonoscopy:  PAP:  Bone density:  Lipid panel:  No Known Allergies  Current Outpatient Prescriptions  Medication Sig Dispense Refill  . Docusate Sodium (DULCOLAX STOOL SOFTENER PO) Take 3 tablets by mouth daily.     . ibuprofen (ADVIL,MOTRIN) 200 MG tablet Take 600 mg by  mouth every 6 (six) hours as needed.     . loratadine (CLARITIN) 10 MG tablet Take 10 mg by mouth daily.    . LORazepam (ATIVAN) 0.5 MG tablet Take 1 tablet (0.5 mg total) by mouth at bedtime. (Patient not taking: Reported on 12/20/2014) 30 tablet 0  . polyethylene glycol (MIRALAX / GLYCOLAX) packet Take 17 g by mouth daily.    . Probiotic Product (ALIGN PO) Take 1 capsule by mouth daily.    . prochlorperazine (COMPAZINE) 10 MG tablet Take 1 tablet (10 mg total) by mouth every 6 (six) hours as needed (Nausea or vomiting). (Patient not taking: Reported on 12/20/2014) 30 tablet 3  . venlafaxine XR (EFFEXOR-XR) 75 MG 24 hr capsule Take 1 capsule (75 mg total) by mouth daily with breakfast. 90 capsule 3  . zolpidem (AMBIEN) 5 MG tablet Take 5 mg by mouth at bedtime as needed for sleep.     No current facility-administered medications for this visit.   Facility-Administered Medications Ordered in Other Visits  Medication Dose Route Frequency Provider Last Rate Last Dose  . sodium chloride 0.9 % injection 10 mL    10 mL Intravenous PRN [name], MD   10 mL at 12/20/14 0908    OBJECTIVE: young White woman who appears stated age 16 Vitals:   01/10/15 0936  BP: 111/66  Pulse: 87  Temp: 98.3 F (36.8 C)  Resp: 20     Body mass index is 33.88 kg/(m^2).      ECOG FS 1  Skin: warm, dry  HEENT: sclerae anicteric, conjunctivae pink, oropharynx clear. There is mild mucositis, but no obvious open lesions are noted. No thrush. Lymph Nodes: No cervical or supraclavicular lymphadenopathy  Lungs: clear to auscultation bilaterally, no rales, wheezes, or rhonci  Heart: regular rate and rhythm  Abdomen: round, soft, non tender, positive bowel sounds  Musculoskeletal: No focal spinal tenderness, no peripheral edema  Neuro: non focal, well oriented, positive affect  Breasts: deferred  LAB RESULTS:  INo results found for: SPEP, UPEP  CBC Latest Ref Rng 01/10/2015 01/03/2015 12/27/2014  WBC  3.9 - 10.3 10e3/uL 4.5 5.7 5.7  Hemoglobin 11.6 - 15.9 g/dL 12.5 12.1 13.3  Hematocrit 34.8 - 46.6 % 36.6 36.4 40.0  Platelets 145 - 400 10e3/uL 247 219 254   CMP Latest Ref Rng 01/10/2015 01/03/2015 12/27/2014  Glucose 70 - 140 mg/dl 92 101 96  BUN 7.0 - 26.0 mg/dL 11.1 7.2 6.9(L)  Creatinine 0.6 - 1.1 mg/dL 0.7 0.7 0.7  Sodium 136 - 145 mEq/L 140 140 141  Potassium 3.5 - 5.1 mEq/L 4.2 4.0 4.0  Chloride 101 - 111 mmol/L - - -  CO2 22 - 29 mEq/L _0 Calcium 8.4 - 10.4 mg/dL 9.9 9.6 9.8  Total Protein 6.4 - 8.3 g/dL 6.8 6.7 7.1  Total Bilirubin 0.20 - 1.20 mg/dL 0.31 <0.30 0.33  Alkaline Phos 40 - 150 U/L 93 89 92  AST 5 - 34 U/L _1 ALT 0 - 55 U/L 49 35 33     STUDIES: No results found.  ASSESSMENT: 49 y.o. BRCA negative [place] woman status post left breast biopsy 07/04/2014 for a clinical T3 N0, stage IIA invasive lobular breast cancer, grade 2, estrogen receptor positive, progesterone receptor 1% "positive", with an MIB-1 of less than 5% and no HER-2 amplification  (1) genetics testing 07/29/2014 through the [employer]va[employer] gene panel offered by [employer] found no deleterious mutations in ATM, BARD1, BRCA1, BRCA2, BRIP1, CDH1, CHEK2, EPCAM, MLH1, MRE11A, MSH2, MSH6, MUTYH, NBN, NF1, PALB2, PMS2, PTEN, RAD50, RAD51C, RAD51D, SMARCA4, STK11, or TP53.   (2) status post bilateral mastectomies 08/10/2014 showing  (a) on the right, lobular carcinoma in situ  (b) on the left, a pT3 pN1, stage IIIA invasive lobular breast cancer, HER-2 repeated and again negative  (3) chemotherapy started 10/11/2014,consisting of doxorubicin and cyclophosphamide in dose dense fashion 4, completed 11/22/2014, followed by paclitaxel weekly 12 started 12/06/2014  (4) radiation to follow chemotherapy   (5) tamoxifen was started neoadjuvantly on 07/13/2014 given the likely delay in definitive surgery while genetics results are pending; it is being held during chemotherapy   PLAN: Will  proceed with cycle 5 of paclitaxel as planned. She has asked  In the past to have the 8m dexamethasone and 0.550mdose of ativan removed from her premeds this time. Pharmacy to consult with Dr. MaJana Hakimegarding the need for dexamethasone during this current treeatment   ReAhriaill return in 1 week for cycle 6 of treatment. She understands and agrees with this plan. She knows the goal of treatment in her case is cure.  She has been encouraged to call with any issues that might arise before her next visit here.    Rondel Jumbo, PA-C   01/10/2015 9:55 AM

## 2015-01-17 ENCOUNTER — Other Ambulatory Visit (HOSPITAL_BASED_OUTPATIENT_CLINIC_OR_DEPARTMENT_OTHER): Payer: BLUE CROSS/BLUE SHIELD

## 2015-01-17 ENCOUNTER — Encounter: Payer: Self-pay | Admitting: *Deleted

## 2015-01-17 ENCOUNTER — Ambulatory Visit (HOSPITAL_BASED_OUTPATIENT_CLINIC_OR_DEPARTMENT_OTHER): Payer: BLUE CROSS/BLUE SHIELD | Admitting: Oncology

## 2015-01-17 ENCOUNTER — Ambulatory Visit (HOSPITAL_BASED_OUTPATIENT_CLINIC_OR_DEPARTMENT_OTHER): Payer: BLUE CROSS/BLUE SHIELD

## 2015-01-17 ENCOUNTER — Ambulatory Visit: Payer: BLUE CROSS/BLUE SHIELD

## 2015-01-17 VITALS — BP 118/75 | HR 116 | Temp 98.0°F | Resp 18 | Ht 68.0 in | Wt 224.5 lb

## 2015-01-17 DIAGNOSIS — C50412 Malignant neoplasm of upper-outer quadrant of left female breast: Secondary | ICD-10-CM | POA: Diagnosis not present

## 2015-01-17 DIAGNOSIS — D0501 Lobular carcinoma in situ of right breast: Secondary | ICD-10-CM | POA: Diagnosis not present

## 2015-01-17 DIAGNOSIS — Z95828 Presence of other vascular implants and grafts: Secondary | ICD-10-CM

## 2015-01-17 DIAGNOSIS — Z170 Estrogen receptor positive status [ER+]: Secondary | ICD-10-CM | POA: Diagnosis not present

## 2015-01-17 DIAGNOSIS — Z5111 Encounter for antineoplastic chemotherapy: Secondary | ICD-10-CM

## 2015-01-17 LAB — CBC WITH DIFFERENTIAL/PLATELET
BASO%: 1.4 % (ref 0.0–2.0)
Basophils Absolute: 0.1 10*3/uL (ref 0.0–0.1)
EOS ABS: 0.2 10*3/uL (ref 0.0–0.5)
EOS%: 3.7 % (ref 0.0–7.0)
HEMATOCRIT: 36.4 % (ref 34.8–46.6)
HEMOGLOBIN: 12.2 g/dL (ref 11.6–15.9)
LYMPH#: 1.4 10*3/uL (ref 0.9–3.3)
LYMPH%: 24.3 % (ref 14.0–49.7)
MCH: 30.5 pg (ref 25.1–34.0)
MCHC: 33.5 g/dL (ref 31.5–36.0)
MCV: 91 fL (ref 79.5–101.0)
MONO#: 0.5 10*3/uL (ref 0.1–0.9)
MONO%: 8.9 % (ref 0.0–14.0)
NEUT#: 3.5 10*3/uL (ref 1.5–6.5)
NEUT%: 61.7 % (ref 38.4–76.8)
PLATELETS: 267 10*3/uL (ref 145–400)
RBC: 4 10*6/uL (ref 3.70–5.45)
RDW: 14 % (ref 11.2–14.5)
WBC: 5.6 10*3/uL (ref 3.9–10.3)
nRBC: 0 % (ref 0–0)

## 2015-01-17 LAB — COMPREHENSIVE METABOLIC PANEL (CC13)
ALBUMIN: 3.5 g/dL (ref 3.5–5.0)
ALK PHOS: 95 U/L (ref 40–150)
ALT: 40 U/L (ref 0–55)
ANION GAP: 9 meq/L (ref 3–11)
AST: 24 U/L (ref 5–34)
BUN: 13.6 mg/dL (ref 7.0–26.0)
CALCIUM: 9.2 mg/dL (ref 8.4–10.4)
CO2: 25 mEq/L (ref 22–29)
CREATININE: 0.7 mg/dL (ref 0.6–1.1)
Chloride: 106 mEq/L (ref 98–109)
EGFR: >90 mL/min/{1.73_m2} (ref >90)
Glucose: 101 mg/dl (ref 70–140)
Potassium: 4 mEq/L (ref 3.5–5.1)
Sodium: 140 mEq/L (ref 136–145)
Total Bilirubin: <0.3 mg/dL (ref 0.20–1.20)
Total Protein: 6.6 g/dL (ref 6.4–8.3)

## 2015-01-17 MED ORDER — HEPARIN SOD (PORK) LOCK FLUSH 100 UNIT/ML IV SOLN
500.0000 [IU] | Freq: Once | INTRAVENOUS | Status: AC | PRN
  Administered 2015-01-17: 500 [IU]
  Filled 2015-01-17: qty 5

## 2015-01-17 MED ORDER — DIPHENHYDRAMINE HCL 50 MG/ML IJ SOLN
INTRAMUSCULAR | Status: AC
  Filled 2015-01-17: qty 1

## 2015-01-17 MED ORDER — SODIUM CHLORIDE 0.9 % IJ SOLN
10.0000 mL | INTRAMUSCULAR | Status: DC | PRN
  Administered 2015-01-17: 10 mL
  Filled 2015-01-17: qty 10

## 2015-01-17 MED ORDER — FAMOTIDINE IN NACL 20-0.9 MG/50ML-% IV SOLN
INTRAVENOUS | Status: AC
  Filled 2015-01-17: qty 50

## 2015-01-17 MED ORDER — SODIUM CHLORIDE 0.9 % IJ SOLN
10.0000 mL | INTRAMUSCULAR | Status: DC | PRN
  Administered 2015-01-17: 10 mL via INTRAVENOUS
  Filled 2015-01-17: qty 10

## 2015-01-17 MED ORDER — DIPHENHYDRAMINE HCL 50 MG/ML IJ SOLN
25.0000 mg | Freq: Once | INTRAMUSCULAR | Status: AC
  Administered 2015-01-17: 25 mg via INTRAVENOUS

## 2015-01-17 MED ORDER — PACLITAXEL CHEMO INJECTION 300 MG/50ML
80.0000 mg/m2 | Freq: Once | INTRAVENOUS | Status: AC
  Administered 2015-01-17: 174 mg via INTRAVENOUS
  Filled 2015-01-17: qty 29

## 2015-01-17 MED ORDER — SODIUM CHLORIDE 0.9 % IV SOLN
Freq: Once | INTRAVENOUS | Status: AC
  Administered 2015-01-17: 13:00:00 via INTRAVENOUS
  Filled 2015-01-17: qty 4

## 2015-01-17 MED ORDER — FAMOTIDINE IN NACL 20-0.9 MG/50ML-% IV SOLN
20.0000 mg | Freq: Once | INTRAVENOUS | Status: AC
  Administered 2015-01-17: 20 mg via INTRAVENOUS

## 2015-01-17 MED ORDER — LORAZEPAM 0.5 MG PO TABS: 0.5000 mg | ORAL_TABLET | Freq: Every day | ORAL | Status: DC

## 2015-01-17 MED ORDER — SODIUM CHLORIDE 0.9 % IV SOLN
Freq: Once | INTRAVENOUS | Status: AC
  Administered 2015-01-17: 13:00:00 via INTRAVENOUS

## 2015-01-17 NOTE — Progress Notes (Signed)
[place]  [phone] [phone]   ID: Lori [name] DOB: June 20, 1965  MR#: [id number]  HQR#:[id number]  Patient Care Team: Lori Shan, MD as PCP - General (Family Medicine) Lori Messing III, MD as Consulting Physician (General Surgery) Lori Cruel, MD as Consulting Physician (Oncology) Lori Gibson, MD as Attending Physician (Radiation Oncology) Lori Germany, RN as Registered Nurse Lori Kaufmann, RN as Registered Nurse Lori Bouche, NP as Nurse Practitioner (Nurse Practitioner) PCP: Lori Simmer, MD GYN: Lori Ache MD OTHER MD:  CHIEF COMPLAINT: Estrogen receptor positive breast cancer  CURRENT TREATMENT: adjuvant chemotherapy  BREAST CANCER HISTORY: From the original intake note:  Lori [name] (who is the daughter of my former patient, Lori [name], herself now 10 years out from her T1 cN0 invasive ductal carcinoma, treated with anti-estrogens only) [facility] April 20 03/23/2014. Breast density was category C. A possible mass in the left breast upper outer quadrant was felt to warrant further evaluation, and on 07/04/2014 the patient underwent left mammography with tomosynthesis and left breast ultrasonography. At this point the patient felt she was able to palpate a mass in the area in question. Tomosynthesis did reveal an irregular mass in the upper outer left breast measuring 2.3 cm, associated on physical exam with a large area of thickening. Ultrasound of the area in question confirmed an irregular mass at the 1:00 position 5 cm from the nipple measuring 2.2 cm. The left axilla was negative sonographically.  Biopsy of the mass in question the same day, 07/04/2014, showed ([id number]) an invasive lobular breast cancer which was estrogen receptor 50% positive with strong staining intensity, progesterone receptor 1% positive with moderate staining intensity, with a proliferation  marker of 1%, and no HER-2 amplification, the signals ratio being 1.19 and the number per cell 1.55.  On 07/08/2014 the patient underwent bilateral breast MRI. This showed no abnormal adenopathy and no abnormality in the right breast. In the left breast however there was a 9.3 area of abnormal enhancement involving all quadrants. Within the upper outer quadrant there was a denser area which measured up to 5.1 cm.  The patient's subsequent history is as detailed below  INTERVAL HISTORY: Lori [name] returns today for follow-up of her stage II breast cancer. Today she is due for cycle 6 of 12 planned cycles of weekly paclitaxel. She has had no neuropathy, but she does have severe joint pain which lasts about 2-1/2 days. Sheehan up taking 800 mg of ibuprofen every 4-6 hours during this time. In addition she continues to have mouth sores. She uses Magic mouthwash for this.  REVIEW OF SYSTEMS: Lori [name] feels moderately fatigued, short of breath sometimes when she climbs stairs, and she has moderate hot flashes. Aside from these issues a detailed review of systems today was stable.  PAST MEDICAL HISTORY: Past Medical History  Diagnosis Date  . Asthma     last trimester of pregnancy 3 yrs ago-  never had problems since then, no meds  . Breast cancer ([place])   . Anxiety     PAST SURGICAL HISTORY: Past Surgical History  Procedure Laterality Date  . Cesarean section      x 2  . Knee arthroscopy Left 07/27/2013    Procedure: LEFT ARTHROSCOPY KNEE WITH MEDIAL MENISCAL DEBRIDEMENT;  Surgeon: Lori Alf, MD;  Location: [facility];  Service: Orthopedics;  Laterality: Left;  Marland Kitchen Mastectomy w/ sentinel node biopsy Left 08/10/2014  . Mastectomy  w/ sentinel node biopsy Bilateral 08/10/2014    Procedure: MASTECTOMY WITH SENTINEL LYMPH NODE BIOPSY;  Surgeon: Lori Messing III, MD;  Location: [place];  Service: General;  Laterality: Bilateral;  . Portacath placement Right 09/07/2014    Procedure: INSERTION PORT-A-CATH;  Surgeon:  Lori Messing III, MD;  Location: [place];  Service: General;  Laterality: Right;    FAMILY HISTORY Family History  Problem Relation Age of Onset  . Breast cancer Lori [name] 23  . Lymphoma Lori [name] 71  . Uterine cancer Lori [name] 91  . Uterine cancer Lori [name] 42  . Ovarian cancer Lori [name] 5    mat great [name] through [facility]  . Ovarian cancer Lori [name] 78  The patient's father died with congestive heart failure at the age of 92. The patient's Lori [name] is living, age 42. She was diagnosed with breast cancer at age 54. The patient's Lori [name] was diagnosed with uterine cancer at age 56 and her daughter, the patient's Lori [name], also with uterine cancer at age 94. On the Lori [name]'s side one Lori [name] was diagnosed with lymphoma and the Lori [name] with throat cancer. A Lori great [name] at age 38 was diagnosed with ovarian cancer.   GYNECOLOGIC HISTORY:  No LMP recorded.  menarche age 57, first live birth age 67, which the patient is aware double as the risk of breast cancer. She is GX P2. She still having regular periods. 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 Her husband Lori [name] the third (goes by "Lori [name]") works in Engineer, technical sales, although currently he is looking for work. Their children are Lori [name] 12 and Lori [name] 3. The patient is not a church attender    ADVANCED DIRECTIVES: Not in place   HEALTH MAINTENANCE: Social History  Substance Use Topics  . Smoking status: Former Smoker -- 1.00 packs/day for 8 years    Types: Cigarettes    Quit date: 07/20/2000  . Smokeless tobacco: Never Used  . Alcohol Use: Yes     Comment: socially     Colonoscopy:  PAP:  Bone density:  Lipid panel:  No Known Allergies  Current Outpatient Prescriptions  Medication Sig Dispense Refill  . Docusate Sodium (DULCOLAX STOOL SOFTENER PO) Take 3 tablets by mouth daily.     Marland Kitchen ibuprofen  (ADVIL,MOTRIN) 200 MG tablet Take 600 mg by mouth every 6 (six) hours as needed.     . loratadine (CLARITIN) 10 MG tablet Take 10 mg by mouth daily.    Marland Kitchen LORazepam (ATIVAN) 0.5 MG tablet Take 1 tablet (0.5 mg total) by mouth at bedtime. (Patient not taking: Reported on 12/20/2014) 30 tablet 0  . polyethylene glycol (MIRALAX / GLYCOLAX) packet Take 17 g by mouth daily.    . Probiotic Product (ALIGN PO) Take 1 capsule by mouth daily.    . prochlorperazine (COMPAZINE) 10 MG tablet Take 1 tablet (10 mg total) by mouth every 6 (six) hours as needed (Nausea or vomiting). (Patient not taking: Reported on 12/20/2014) 30 tablet 3  . venlafaxine XR (EFFEXOR-XR) 75 MG 24 hr capsule Take 1 capsule (75 mg total) by mouth daily with breakfast. 90 capsule 3  . zolpidem (AMBIEN) 5 MG tablet Take 5 mg by mouth at bedtime as needed for sleep.     No current facility-administered medications for this visit.   Facility-Administered Medications Ordered in Lori [name] Visits  Medication Dose Route Frequency Provider Last Rate Last Dose  .  sodium chloride 0.9 % injection 10 mL  10 mL Intravenous PRN Lori Cruel, MD   10 mL at 12/20/14 0908    OBJECTIVE: young White woman in no acute distress Filed Vitals:   01/17/15 1203  BP: 118/75  Pulse: 116  Temp: 98 F (36.7 C)  Resp: 18     Body mass index is 34.14 kg/(m^2).      ECOG FS 1  Sclerae unicteric, pupils round and equal Oropharynx shows no obvious ulceration or thrush No cervical or supraclavicular adenopathy Lungs no rales or rhonchi Heart regular rate and rhythm Abd soft, nontender, positive bowel sounds MSK no focal spinal tenderness, no upper extremity lymphedema Neuro: nonfocal, well oriented, appropriate affect Breasts: Deferred   LAB RESULTS:  INo results found for: SPEP, UPEP  CBC Latest Ref Rng 01/17/2015 01/10/2015 01/03/2015  WBC 3.9 - 10.3 10e3/uL 5.6 4.5 5.7  Hemoglobin 11.6 - 15.9 g/dL 12.2 12.5 12.1  Hematocrit 34.8 - 46.6 % 36.4  36.6 36.4  Platelets 145 - 400 10e3/uL 267 247 219   CMP Latest Ref Rng 01/10/2015 01/03/2015 12/27/2014  Glucose 70 - 140 mg/dl 92 101 96  BUN 7.0 - 26.0 mg/dL 11.1 7.2 6.9(L)  Creatinine 0.6 - 1.1 mg/dL 0.7 0.7 0.7  Sodium 136 - 145 mEq/L 140 140 141  Potassium 3.5 - 5.1 mEq/L 4.2 4.0 4.0  Chloride 101 - 111 mmol/L - - -  CO2 22 - 29 mEq/L 25 26 25   Calcium 8.4 - 10.4 mg/dL 9.9 9.6 9.8  Total Protein 6.4 - 8.3 g/dL 6.8 6.7 7.1  Total Bilirubin 0.20 - 1.20 mg/dL 0.31 <0.30 0.33  Alkaline Phos 40 - 150 U/L 93 89 92  AST 5 - 34 U/L 31 23 21   ALT 0 - 55 U/L 49 35 33     STUDIES: No results found.  ASSESSMENT: 49 y.o. BRCA negative [place] woman status post left breast biopsy 07/04/2014 for a clinical T3 N0, stage IIA invasive lobular breast cancer, grade 2, estrogen receptor positive, progesterone receptor 1% "positive", with an MIB-1 of less than 5% and no HER-2 amplification  (1) genetics testing 07/29/2014 through the [employer] gene panel offered by [employer] found no deleterious mutations in ATM, BARD1, BRCA1, BRCA2, BRIP1, CDH1, CHEK2, EPCAM, MLH1, MRE11A, MSH2, MSH6, MUTYH, NBN, NF1, PALB2, PMS2, PTEN, RAD50, RAD51C, RAD51D, SMARCA4, STK11, or TP53.   (2) status post bilateral mastectomies 08/10/2014 showing  (a) on the right, lobular carcinoma in situ  (b) on the left, a pT3 pN1, stage IIIA invasive lobular breast cancer, HER-2 repeated and again negative  (3) chemotherapy started 10/11/2014,consisting of doxorubicin and cyclophosphamide in dose dense fashion 4, completed 11/22/2014, followed by paclitaxel weekly 12 started 12/06/2014  (4) radiation to follow chemotherapy   (5) tamoxifen was started neoadjuvantly on 07/13/2014 given the likely delay in definitive surgery while genetics results are pending; it is being held during chemotherapy   PLAN: I am not sure were going to be able to continue the paclitaxel if Lori [name] continues to experience the severe bony aches  and pains. She will let us know if they occur again with this treatment.  If they do, I am going to change her to Abraxane with the next treatment. I would think that that would take care of the problem. Of course the goal is for her to receive 12 doses of Taxol whether as Taxol or as Abraxane.  We are proceeding with cycle 6 today.  We discussed her mouth  sores. She found that the Valtrex made things worse rather than better. She is using Magic mouthwash which is helping for that  I am concerned about the amount of ibuprofen that she is taking for the bony aches that she gets from the Taxol. Of course this can hurt your kidneys and cause gastritis and ulcers. However she is very reluctant to try tramadol. She just is afraid of any new medication that she might receive.  She knows to call for any problems that may develop before her next visit here. Lori Cruel, MD   01/17/2015 12:10 PM

## 2015-01-17 NOTE — Patient Instructions (Signed)

## 2015-01-19 ENCOUNTER — Other Ambulatory Visit: Payer: Self-pay | Admitting: Oncology

## 2015-01-24 ENCOUNTER — Other Ambulatory Visit (HOSPITAL_BASED_OUTPATIENT_CLINIC_OR_DEPARTMENT_OTHER): Payer: BLUE CROSS/BLUE SHIELD

## 2015-01-24 ENCOUNTER — Encounter: Payer: Self-pay | Admitting: Nurse Practitioner

## 2015-01-24 ENCOUNTER — Ambulatory Visit (HOSPITAL_BASED_OUTPATIENT_CLINIC_OR_DEPARTMENT_OTHER): Payer: BLUE CROSS/BLUE SHIELD | Admitting: Nurse Practitioner

## 2015-01-24 ENCOUNTER — Ambulatory Visit: Payer: BLUE CROSS/BLUE SHIELD

## 2015-01-24 ENCOUNTER — Ambulatory Visit (HOSPITAL_BASED_OUTPATIENT_CLINIC_OR_DEPARTMENT_OTHER): Payer: BLUE CROSS/BLUE SHIELD

## 2015-01-24 VITALS — BP 113/71 | HR 96 | Temp 98.1°F | Resp 18 | Ht 68.0 in | Wt 225.2 lb

## 2015-01-24 DIAGNOSIS — Z5111 Encounter for antineoplastic chemotherapy: Secondary | ICD-10-CM

## 2015-01-24 DIAGNOSIS — C50412 Malignant neoplasm of upper-outer quadrant of left female breast: Secondary | ICD-10-CM

## 2015-01-24 DIAGNOSIS — Z95828 Presence of other vascular implants and grafts: Secondary | ICD-10-CM

## 2015-01-24 LAB — CBC WITH DIFFERENTIAL/PLATELET
BASO%: 1.6 % (ref 0.0–2.0)
BASOS ABS: 0.1 10*3/uL (ref 0.0–0.1)
EOS ABS: 0.1 10*3/uL (ref 0.0–0.5)
EOS%: 2.9 % (ref 0.0–7.0)
HCT: 37 % (ref 34.8–46.6)
HGB: 12.3 g/dL (ref 11.6–15.9)
LYMPH%: 22.5 % (ref 14.0–49.7)
MCH: 29.9 pg (ref 25.1–34.0)
MCHC: 33.2 g/dL (ref 31.5–36.0)
MCV: 90 fL (ref 79.5–101.0)
MONO#: 0.4 10*3/uL (ref 0.1–0.9)
MONO%: 8.3 % (ref 0.0–14.0)
NEUT%: 64.7 % (ref 38.4–76.8)
NEUTROS ABS: 3.3 10*3/uL (ref 1.5–6.5)
Platelets: 265 10*3/uL (ref 145–400)
RBC: 4.11 10*6/uL (ref 3.70–5.45)
RDW: 14.6 % — ABNORMAL HIGH (ref 11.2–14.5)
WBC: 5.1 10*3/uL (ref 3.9–10.3)
lymph#: 1.2 10*3/uL (ref 0.9–3.3)

## 2015-01-24 LAB — COMPREHENSIVE METABOLIC PANEL (CC13)
ALT: 26 U/L (ref 0–55)
AST: 20 U/L (ref 5–34)
Albumin: 3.6 g/dL (ref 3.5–5.0)
Alkaline Phosphatase: 89 U/L (ref 40–150)
Anion Gap: 9 mEq/L (ref 3–11)
BUN: 13.8 mg/dL (ref 7.0–26.0)
CO2: 25 meq/L (ref 22–29)
Calcium: 9.5 mg/dL (ref 8.4–10.4)
Chloride: 106 mEq/L (ref 98–109)
Creatinine: 0.7 mg/dL (ref 0.6–1.1)
GLUCOSE: 104 mg/dL (ref 70–140)
POTASSIUM: 4.1 meq/L (ref 3.5–5.1)
SODIUM: 140 meq/L (ref 136–145)
Total Bilirubin: <0.3 mg/dL (ref 0.20–1.20)
Total Protein: 6.7 g/dL (ref 6.4–8.3)

## 2015-01-24 MED ORDER — SODIUM CHLORIDE 0.9 % IJ SOLN
10.0000 mL | INTRAMUSCULAR | Status: DC | PRN
  Administered 2015-01-24: 10 mL via INTRAVENOUS
  Filled 2015-01-24: qty 10

## 2015-01-24 MED ORDER — SODIUM CHLORIDE 0.9 % IJ SOLN
10.0000 mL | INTRAMUSCULAR | Status: DC | PRN
  Administered 2015-01-24: 10 mL
  Filled 2015-01-24: qty 10

## 2015-01-24 MED ORDER — SODIUM CHLORIDE 0.9 % IV SOLN
Freq: Once | INTRAVENOUS | Status: AC
  Administered 2015-01-24: 11:00:00 via INTRAVENOUS

## 2015-01-24 MED ORDER — HEPARIN SOD (PORK) LOCK FLUSH 100 UNIT/ML IV SOLN
500.0000 [IU] | Freq: Once | INTRAVENOUS | Status: AC | PRN
  Administered 2015-01-24: 500 [IU]
  Filled 2015-01-24: qty 5

## 2015-01-24 MED ORDER — PACLITAXEL PROTEIN-BOUND CHEMO INJECTION 100 MG
100.0000 mg/m2 | Freq: Once | INTRAVENOUS | Status: AC
  Administered 2015-01-24: 225 mg via INTRAVENOUS
  Filled 2015-01-24: qty 45

## 2015-01-24 MED ORDER — SODIUM CHLORIDE 0.9 % IV SOLN
Freq: Once | INTRAVENOUS | Status: AC
  Administered 2015-01-24: 11:00:00 via INTRAVENOUS
  Filled 2015-01-24: qty 4

## 2015-01-24 NOTE — Patient Instructions (Signed)

## 2015-01-24 NOTE — Progress Notes (Signed)
Pt discharged ambulatory in no acute distress, tolerated 1st abraxane well without difficulty.

## 2015-01-24 NOTE — Progress Notes (Signed)
[place]  Telephone:(336) [phone] Fax:(336) [phone]   ID: Lori [name] DOB: Sep 04, 1965  MR#: [id number]  QQI#:[id number]  Patient Care Team: Delilah Shan, MD as PCP - General (Family Medicine) [name], MD as Consulting Physician (General Surgery) [name], MD as Consulting Physician (Oncology) [name], MD as Attending Physician (Radiation Oncology) [name], RN as Registered Nurse [name], RN as Registered Nurse [name], NP as Nurse Practitioner (Nurse Practitioner) PCP: [name], MD GYN: [name] MD OTHER MD:  CHIEF COMPLAINT: Estrogen receptor positive breast cancer  CURRENT TREATMENT: adjuvant chemotherapy  BREAST CANCER HISTORY: From the original intake note:  Lori [name] (who is the daughter of my former patient, [name], herself now 10 years out from her T1 cN0 invasive ductal carcinoma, treated with anti-estrogens only) [facility] April 20 03/23/2014. Breast density was category C. A possible mass in the left breast upper outer quadrant was felt to warrant further evaluation, and on 07/04/2014 the patient underwent left mammography with tomosynthesis and left breast ultrasonography. At this point the patient felt she was able to palpate a mass in the area in question. Tomosynthesis did reveal an irregular mass in the upper outer left breast measuring 2.3 cm, associated on physical exam with a large area of thickening. Ultrasound of the area in question confirmed an irregular mass at the 1:00 position 5 cm from the nipple measuring 2.2 cm. The left axilla was negative sonographically.  Biopsy of the mass in question the same day, 07/04/2014, showed ([id number]) an invasive lobular breast cancer which was estrogen receptor 50% positive with strong staining intensity, progesterone receptor 1% positive with moderate staining intensity, with a proliferation  marker of 1%, and no HER-2 amplification, the signals ratio being 1.19 and the number per cell 1.55.  On 07/08/2014 the patient underwent bilateral breast MRI. This showed no abnormal adenopathy and no abnormality in the right breast. In the left breast however there was a 9.3 area of abnormal enhancement involving all quadrants. Within the upper outer quadrant there was a denser area which measured up to 5.1 cm.  The patient's subsequent history is as detailed below  INTERVAL HISTORY: Lori [name] returns today for follow-up of her stage II breast cancer, alone. She is to start cycle 1 of 6 planned weekly doses of abraxane. This is being substituted for paclitaxel due to poor tolerance due to severe joint aches. She is using heavy doses of ibuprofen for this and still has some pain today. She is reluctant to try tramadol because she is reportedly sensitive to pain medicines.  REVIEW OF SYSTEMS: Lori [name] denies fevers, chills, nausea, vomiting, or changes in bowel or bladder habits. Her gums are still inflamed and sensitive. She is using magic mouthwash PRN for this. Her appetite is good. She is gaining weight and plans to start riding an recumbent bike. Her bilateral big toenails are sensitive to the touch, but she denies neuropathy symptoms.a detailed review of systems is otherwise stable.  PAST MEDICAL HISTORY: Past Medical History  Diagnosis Date  . Asthma     last trimester of pregnancy 3 yrs ago-  never had problems since then, no meds  . Breast cancer ([place])   . Anxiety     PAST SURGICAL HISTORY: Past Surgical History  Procedure Laterality Date  . Cesarean section      x 2  . Knee arthroscopy Left 07/27/2013    Procedure:  LEFT ARTHROSCOPY KNEE WITH MEDIAL MENISCAL DEBRIDEMENT;  Surgeon: Gearlean Alf, MD;  Location: [facility];  Service: Orthopedics;  Laterality: Left;  Marland Kitchen Mastectomy w/ sentinel node biopsy Left 08/10/2014  . Mastectomy w/ sentinel node biopsy Bilateral 08/10/2014    Procedure:  MASTECTOMY WITH SENTINEL LYMPH NODE BIOPSY;  Surgeon: [name], MD;  Location: [place];  Service: General;  Laterality: Bilateral;  . Portacath placement Right 09/07/2014    Procedure: INSERTION PORT-A-CATH;  Surgeon: [name], MD;  Location: [place];  Service: General;  Laterality: Right;    FAMILY HISTORY Family History  Problem Relation Age of Onset  . Breast cancer Mother 44  . Lymphoma Maternal Aunt 71  . Uterine cancer Paternal Aunt 54  . Uterine cancer Paternal Grandmother 60  . Ovarian cancer Other 75    mat great aunt through [facility]  . Ovarian cancer Maternal Grandmother 75  The patient's father died with congestive heart failure at the age of 63. The patient's mother is living, age 84. She was diagnosed with breast cancer at age 47. The patient's paternal grandmother was diagnosed with uterine cancer at age 103 and her daughter, the patient's paternal aunt, also with uterine cancer at age 76. On the mother's side one maternal aunt was diagnosed with lymphoma and the other with throat cancer. A maternal great aunt at age 75 was diagnosed with ovarian cancer.   GYNECOLOGIC HISTORY:  No LMP recorded.  menarche age 4, first live birth age 40, which the patient is aware double as the risk of breast cancer. She is GX P2. She still having regular periods. 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 Her husband JAMAIYA TUNNELL the third (goes by "[name]") works in Engineer, technical sales, although currently he is looking for work. Their children are [name] 12 and [name] 3. The patient is not a church attender    ADVANCED DIRECTIVES: Not in place   HEALTH MAINTENANCE: Social History  Substance Use Topics  . Smoking status: Former Smoker -- 1.00 packs/day for 8 years    Types: Cigarettes    Quit date: 07/20/2000  . Smokeless tobacco: Never Used  . Alcohol Use: Yes     Comment: socially      Colonoscopy:  PAP:  Bone density:  Lipid panel:  No Known Allergies  Current Outpatient Prescriptions  Medication Sig Dispense Refill  . Docusate Sodium (DULCOLAX STOOL SOFTENER PO) Take 3 tablets by mouth daily.     Marland Kitchen ibuprofen (ADVIL,MOTRIN) 200 MG tablet Take 600 mg by mouth every 6 (six) hours as needed.     . loratadine (CLARITIN) 10 MG tablet Take 10 mg by mouth daily.    Marland Kitchen LORazepam (ATIVAN) 0.5 MG tablet Take 1 tablet (0.5 mg total) by mouth at bedtime. 30 tablet 0  . Probiotic Product (ALIGN PO) Take 1 capsule by mouth daily.    Marland Kitchen venlafaxine XR (EFFEXOR-XR) 75 MG 24 hr capsule Take 1 capsule (75 mg total) by mouth daily with breakfast. 90 capsule 3  . polyethylene glycol (MIRALAX / GLYCOLAX) packet Take 17 g by mouth daily.    . prochlorperazine (COMPAZINE) 10 MG tablet Take 1 tablet (10 mg total) by mouth every 6 (six) hours as needed (Nausea or vomiting). (Patient not taking: Reported on 12/20/2014) 30 tablet 3  . zolpidem (AMBIEN) 5 MG tablet Take 5 mg by mouth at bedtime as needed for sleep.  No current facility-administered medications for this visit.   Facility-Administered Medications Ordered in Other Visits  Medication Dose Route Frequency Provider Last Rate Last Dose  . sodium chloride 0.9 % injection 10 mL  10 mL Intravenous PRN [name], MD   10 mL at 12/20/14 0908    OBJECTIVE: young White woman in no acute distress Filed Vitals:   01/24/15 0952  BP: 113/71  Pulse: 96  Temp: 98.1 F (36.7 C)  Resp: 18     Body mass index is 34.25 kg/(m^2).      ECOG FS 1  Skin: warm, dry  HEENT: sclerae anicteric, conjunctivae pink, oropharynx clear. No thrush or mucositis.  Lymph Nodes: No cervical or supraclavicular lymphadenopathy  Lungs: clear to auscultation bilaterally, no rales, wheezes, or rhonci  Heart: regular rate and rhythm  Abdomen: round, soft, non tender, positive bowel sounds  Musculoskeletal: No focal spinal tenderness, no  peripheral edema  Neuro: non focal, well oriented, positive affect  Breasts: deferred   LAB RESULTS:  INo results found for: SPEP, UPEP  CBC Latest Ref Rng 01/24/2015 01/17/2015 01/10/2015  WBC 3.9 - 10.3 10e3/uL 5.1 5.6 4.5  Hemoglobin 11.6 - 15.9 g/dL 12.3 12.2 12.5  Hematocrit 34.8 - 46.6 % 37.0 36.4 36.6  Platelets 145 - 400 10e3/uL 265 267 247   CMP Latest Ref Rng 01/24/2015 01/17/2015 01/10/2015  Glucose 70 - 140 mg/dl 104 101 92  BUN 7.0 - 26.0 mg/dL 13.8 13.6 11.1  Creatinine 0.6 - 1.1 mg/dL 0.7 0.7 0.7  Sodium 136 - 145 mEq/L 140 140 140  Potassium 3.5 - 5.1 mEq/L 4.1 4.0 4.2  Chloride 101 - 111 mmol/L - - -  CO2 22 - 29 mEq/L _0 Calcium 8.4 - 10.4 mg/dL 9.5 9.2 9.9  Total Protein 6.4 - 8.3 g/dL 6.7 6.6 6.8  Total Bilirubin 0.20 - 1.20 mg/dL <0.30 <0.30 0.31  Alkaline Phos 40 - 150 U/L 89 95 93  AST 5 - 34 U/L _1 ALT 0 - 55 U/L 26 40 49     STUDIES: No results found.  ASSESSMENT: 48 y.o. BRCA negative [place] woman status post left breast biopsy 07/04/2014 for a clinical T3 N0, stage IIA invasive lobular breast cancer, grade 2, estrogen receptor positive, progesterone receptor 1% "positive", with an MIB-1 of less than 5% and no HER-2 amplification  (1) genetics testing 07/29/2014 through the OvaNext gene panel offered by [employer] found no deleterious mutations in ATM, BARD1, BRCA1, BRCA2, BRIP1, CDH1, CHEK2, EPCAM, MLH1, MRE11A, MSH2, MSH6, MUTYH, NBN, NF1, PALB2, PMS2, PTEN, RAD50, RAD51C, RAD51D, SMARCA4, STK11, or TP53.   (2) status post bilateral mastectomies 08/10/2014 showing  (a) on the right, lobular carcinoma in situ  (b) on the left, a pT3 pN1, stage IIIA invasive lobular breast cancer, HER-2 repeated and again negative  (3) chemotherapy started 10/11/2014,consisting of doxorubicin and cyclophosphamide in dose dense fashion 4, completed 11/22/2014, followed by paclitaxel weekly 6 started 12/06/2014. abraxane weekly x 6 started  11/22.   (4) radiation to follow chemotherapy   (5) tamoxifen was started neoadjuvantly on 07/13/2014 given the likely delay in definitive surgery while genetics results are pending; it is being held during chemotherapy   PLAN: Lori [name] is doing ok today. The labs were reviewed in detail and were entirely normal. She will proceed with cycle 1 of abraxane as planned today. The dexamethasone was removed as a premed due to patient preference.   I explained the her  toenails may be sensitive due to chemotherapy, and in some cases may even fall off. She will keep them dry, cleaned, and trimmed for now.  Lori [name] will return in 1 week for cycle 2 of abraxane. She understands and agrees with this plan. She knows the goal of treatment in her case is cure. She has been encouraged to call with any issues that might arise before her next visit here.   Laurie Panda, NP   01/24/2015 10:24 AM

## 2015-01-30 ENCOUNTER — Other Ambulatory Visit: Payer: Self-pay | Admitting: Nurse Practitioner

## 2015-01-31 ENCOUNTER — Telehealth: Payer: Self-pay | Admitting: Oncology

## 2015-01-31 ENCOUNTER — Ambulatory Visit: Payer: BLUE CROSS/BLUE SHIELD

## 2015-01-31 ENCOUNTER — Other Ambulatory Visit (HOSPITAL_BASED_OUTPATIENT_CLINIC_OR_DEPARTMENT_OTHER): Payer: BLUE CROSS/BLUE SHIELD

## 2015-01-31 ENCOUNTER — Ambulatory Visit (HOSPITAL_BASED_OUTPATIENT_CLINIC_OR_DEPARTMENT_OTHER): Payer: BLUE CROSS/BLUE SHIELD | Admitting: Nurse Practitioner

## 2015-01-31 ENCOUNTER — Ambulatory Visit (HOSPITAL_BASED_OUTPATIENT_CLINIC_OR_DEPARTMENT_OTHER): Payer: BLUE CROSS/BLUE SHIELD

## 2015-01-31 ENCOUNTER — Encounter: Payer: Self-pay | Admitting: Nurse Practitioner

## 2015-01-31 ENCOUNTER — Encounter: Payer: Self-pay | Admitting: *Deleted

## 2015-01-31 VITALS — BP 114/63 | HR 83 | Temp 99.2°F | Resp 18 | Ht 68.0 in | Wt 227.0 lb

## 2015-01-31 DIAGNOSIS — C50412 Malignant neoplasm of upper-outer quadrant of left female breast: Secondary | ICD-10-CM | POA: Diagnosis not present

## 2015-01-31 DIAGNOSIS — Z452 Encounter for adjustment and management of vascular access device: Secondary | ICD-10-CM

## 2015-01-31 DIAGNOSIS — Z95828 Presence of other vascular implants and grafts: Secondary | ICD-10-CM

## 2015-01-31 LAB — CBC WITH DIFFERENTIAL/PLATELET
BASO%: 2.2 % — ABNORMAL HIGH (ref 0.0–2.0)
BASOS ABS: 0.1 10*3/uL (ref 0.0–0.1)
EOS%: 3.7 % (ref 0.0–7.0)
Eosinophils Absolute: 0.2 10*3/uL (ref 0.0–0.5)
HEMATOCRIT: 36.3 % (ref 34.8–46.6)
HGB: 12 g/dL (ref 11.6–15.9)
LYMPH#: 1.2 10*3/uL (ref 0.9–3.3)
LYMPH%: 27.4 % (ref 14.0–49.7)
MCH: 29.9 pg (ref 25.1–34.0)
MCHC: 33.1 g/dL (ref 31.5–36.0)
MCV: 90.3 fL (ref 79.5–101.0)
MONO#: 0.4 10*3/uL (ref 0.1–0.9)
MONO%: 8.1 % (ref 0.0–14.0)
NEUT#: 2.6 10*3/uL (ref 1.5–6.5)
NEUT%: 58.6 % (ref 38.4–76.8)
PLATELETS: 271 10*3/uL (ref 145–400)
RBC: 4.02 10*6/uL (ref 3.70–5.45)
RDW: 14.3 % (ref 11.2–14.5)
WBC: 4.4 10*3/uL (ref 3.9–10.3)

## 2015-01-31 LAB — COMPREHENSIVE METABOLIC PANEL (CC13)
ALBUMIN: 3.6 g/dL (ref 3.5–5.0)
ALK PHOS: 84 U/L (ref 40–150)
ALT: 26 U/L (ref 0–55)
AST: 21 U/L (ref 5–34)
Anion Gap: 6 mEq/L (ref 3–11)
BILIRUBIN TOTAL: 0.31 mg/dL (ref 0.20–1.20)
BUN: 10 mg/dL (ref 7.0–26.0)
CALCIUM: 9.5 mg/dL (ref 8.4–10.4)
CO2: 25 mEq/L (ref 22–29)
CREATININE: 0.7 mg/dL (ref 0.6–1.1)
Chloride: 109 mEq/L (ref 98–109)
EGFR: >90 mL/min/{1.73_m2} (ref >90)
Glucose: 94 mg/dl (ref 70–140)
Potassium: 4.2 mEq/L (ref 3.5–5.1)
Sodium: 139 mEq/L (ref 136–145)
TOTAL PROTEIN: 6.7 g/dL (ref 6.4–8.3)

## 2015-01-31 MED ORDER — ALTEPLASE 2 MG IJ SOLR
2.0000 mg | Freq: Once | INTRAMUSCULAR | Status: AC
Start: 2015-01-31 — End: 2015-01-31
  Administered 2015-01-31: 2 mg
  Filled 2015-01-31: qty 2

## 2015-01-31 MED ORDER — HEPARIN SOD (PORK) LOCK FLUSH 100 UNIT/ML IV SOLN
500.0000 [IU] | Freq: Once | INTRAVENOUS | Status: AC
  Administered 2015-01-31: 500 [IU] via INTRAVENOUS
  Filled 2015-01-31: qty 5

## 2015-01-31 MED ORDER — SODIUM CHLORIDE 0.9 % IJ SOLN
10.0000 mL | INTRAMUSCULAR | Status: DC | PRN
  Administered 2015-01-31: 10 mL via INTRAVENOUS
  Filled 2015-01-31: qty 10

## 2015-01-31 NOTE — Progress Notes (Signed)
[place]  Telephone:(336) [phone] Fax:(336) [phone]   ID: Lori [name] DOB: [birth year]  MR#: [id number]  OIZ#:[id number]  Patient Care Team: Delilah Shan, MD as PCP - General (Family Medicine) [name], MD as Consulting Physician (General Surgery) [name], MD as Consulting Physician (Oncology) [name], MD as Attending Physician (Radiation Oncology) [name], RN as Registered Nurse [name], RN as Registered Nurse [name], NP as Nurse Practitioner (Nurse Practitioner) PCP: [name], MD GYN: [name] MD OTHER MD:  CHIEF COMPLAINT: Estrogen receptor positive breast cancer  CURRENT TREATMENT: adjuvant chemotherapy  BREAST CANCER HISTORY: From the original intake note:  Lori [name] (who is the daughter of my former patient, Lori [name], herself now 10 years out from her T1 cN0 invasive ductal carcinoma, treated with anti-estrogens only) [facility] April 20 03/23/2014. Breast density was category C. A possible mass in the left breast upper outer quadrant was felt to warrant further evaluation, and on 07/04/2014 the patient underwent left mammography with tomosynthesis and left breast ultrasonography. At this point the patient felt she was able to palpate a mass in the area in question. Tomosynthesis did reveal an irregular mass in the upper outer left breast measuring 2.3 cm, associated on physical exam with a large area of thickening. Ultrasound of the area in question confirmed an irregular mass at the 1:00 position 5 cm from the nipple measuring 2.2 cm. The left axilla was negative sonographically.  Biopsy of the mass in question the same day, 07/04/2014, showed (SAA [id number]) an invasive lobular breast cancer which was estrogen receptor 50% positive with strong staining intensity, progesterone receptor 1% positive with moderate staining intensity, with a proliferation  marker of 1%, and no HER-2 amplification, the signals ratio being 1.19 and the number per cell 1.55.  On 07/08/2014 the patient underwent bilateral breast MRI. This showed no abnormal adenopathy and no abnormality in the right breast. In the left breast however there was a 9.3 area of abnormal enhancement involving all quadrants. Within the upper outer quadrant there was a denser area which measured up to 5.1 cm.  The patient's subsequent history is as detailed below  INTERVAL HISTORY: Lori [name] returns today for follow-up of her stage II breast cancer, alone. She received her 1st dose of abraxane last week, which was substituted in place of paclitaxel because of side effects, particularly joint aches. The patient believed she had even more joint aches this past week. At least with paclitaxel the pain subsided after 2 or 3 days. With abraxane the pain lasted for double that amount of time and was not as responsive to NSAIDs. In addition to this she had more nausea, indigestion, and diarrhea than she is accustomed to having. Her fatigue was about the same. She complained of no rash but pruritus to her hands and feet. Her gums were also more inflamed. She denies neuropathy symptoms.  REVIEW OF SYSTEMS: A detailed review of systems is otherwise stable, except where noted above.  PAST MEDICAL HISTORY: Past Medical History  Diagnosis Date  . Asthma     last trimester of pregnancy 3 yrs ago-  never had problems since then, no meds  . Breast cancer ([place])   . Anxiety     PAST SURGICAL HISTORY: Past Surgical History  Procedure Laterality Date  . Cesarean section      x 2  . Knee arthroscopy Left 07/27/2013    Procedure:  LEFT ARTHROSCOPY KNEE WITH MEDIAL MENISCAL DEBRIDEMENT;  Surgeon: Gearlean Alf, MD;  [facility]: [facility];  Service: Orthopedics;  Laterality: Left;  Marland Kitchen Mastectomy w/ sentinel node biopsy Left 08/10/2014  . Mastectomy w/ sentinel node biopsy Bilateral 08/10/2014    Procedure: MASTECTOMY  WITH SENTINEL LYMPH NODE BIOPSY;  Surgeon: [name], MD;  Location: [place];  Service: General;  Laterality: Bilateral;  . Portacath placement Right 09/07/2014    Procedure: INSERTION PORT-A-CATH;  Surgeon: [name], MD;  Location: [place];  Service: General;  Laterality: Right;    FAMILY HISTORY Family History  Problem Relation Age of Onset  . Breast cancer Mother 67  . Lymphoma Maternal Aunt 71  . Uterine cancer Paternal Aunt 70  . Uterine cancer Paternal Grandmother 87  . Ovarian cancer Other 46    mat great aunt through [facility]  . Ovarian cancer Maternal Grandmother 22  The patient's father died with congestive heart failure at the age of 65. The patient's mother is living, age 59. She was diagnosed with breast cancer at age 53. The patient's paternal grandmother was diagnosed with uterine cancer at age 63 and her daughter, the patient's paternal aunt, also with uterine cancer at age 81. On the mother's side one maternal aunt was diagnosed with lymphoma and the other with throat cancer. A maternal great aunt at age 57 was diagnosed with ovarian cancer.   GYNECOLOGIC HISTORY:  No LMP recorded.  menarche age 54, first live birth age 27, which the patient is aware double as the risk of breast cancer. She is GX P2. She still having regular periods. 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 Her husband Lori [name] (goes by "[name]") works in Engineer, technical sales, although currently he is looking for work. Their children are Lori [name] 12 and Lori [name] 3. The patient is not a church attender    ADVANCED DIRECTIVES: Not in place   HEALTH MAINTENANCE: Social History  Substance Use Topics  . Smoking status: Former Smoker -- 1.00 packs/day for 8 years    Types: Cigarettes    Quit date: 07/20/2000  . Smokeless tobacco: Never Used  . Alcohol Use: Yes     Comment: socially     Colonoscopy:  PAP:  Bone  density:  Lipid panel:  No Known Allergies  Current Outpatient Prescriptions  Medication Sig Dispense Refill  . Docusate Sodium (DULCOLAX STOOL SOFTENER PO) Take 3 tablets by mouth daily.     Marland Kitchen ibuprofen (ADVIL,MOTRIN) 200 MG tablet Take 600 mg by mouth every 6 (six) hours as needed.     . loratadine (CLARITIN) 10 MG tablet Take 10 mg by mouth daily.    Marland Kitchen LORazepam (ATIVAN) 0.5 MG tablet Take 1 tablet (0.5 mg total) by mouth at bedtime. 30 tablet 0  . polyethylene glycol (MIRALAX / GLYCOLAX) packet Take 17 g by mouth daily.    . Probiotic Product (ALIGN PO) Take 1 capsule by mouth daily.    . prochlorperazine (COMPAZINE) 10 MG tablet Take 1 tablet (10 mg total) by mouth every 6 (six) hours as needed (Nausea or vomiting). (Patient not taking: Reported on 12/20/2014) 30 tablet 3  . venlafaxine XR (EFFEXOR-XR) 75 MG 24 hr capsule Take 1 capsule (75 mg total) by mouth daily with breakfast. 90 capsule 3  . zolpidem (AMBIEN) 5 MG tablet Take 5 mg by mouth at bedtime as needed for sleep.  No current facility-administered medications for this visit.   Facility-Administered Medications Ordered in Other Visits  Medication Dose Route Frequency Provider Last Rate Last Dose  . sodium chloride 0.9 % injection 10 mL  10 mL Intravenous PRN [name], MD   10 mL at 12/20/14 0908    OBJECTIVE: young White woman in no acute distress Filed Vitals:   01/31/15 1050  BP: 114/63  Pulse: 83  Temp: 99.2 F (37.3 C)  Resp: 18     Body mass index is 34.52 kg/(m^2).      ECOG FS 1  Sclerae unicteric, pupils round and equal Oropharynx clear and moist-- no thrush or other lesions No cervical or supraclavicular adenopathy Lungs no rales or rhonchi Heart regular rate and rhythm Abd soft, nontender, positive bowel sounds MSK no focal spinal tenderness, no upper extremity lymphedema Neuro: nonfocal, well oriented, appropriate affect Breasts: deferred  LAB RESULTS:  INo results found for:  SPEP, UPEP  CBC Latest Ref Rng 01/31/2015 01/24/2015 01/17/2015  WBC 3.9 - 10.3 10e3/uL 4.4 5.1 5.6  Hemoglobin 11.6 - 15.9 g/dL 12.0 12.3 12.2  Hematocrit 34.8 - 46.6 % 36.3 37.0 36.4  Platelets 145 - 400 10e3/uL 271 265 267   CMP Latest Ref Rng 01/31/2015 01/24/2015 01/17/2015  Glucose 70 - 140 mg/dl 94 104 101  BUN 7.0 - 26.0 mg/dL 10.0 13.8 13.6  Creatinine 0.6 - 1.1 mg/dL 0.7 0.7 0.7  Sodium 136 - 145 mEq/L 139 140 140  Potassium 3.5 - 5.1 mEq/L 4.2 4.1 4.0  Chloride 101 - 111 mmol/L - - -  CO2 22 - 29 mEq/L _0 Calcium 8.4 - 10.4 mg/dL 9.5 9.5 9.2  Total Protein 6.4 - 8.3 g/dL 6.7 6.7 6.6  Total Bilirubin 0.20 - 1.20 mg/dL 0.31 <0.30 <0.30  Alkaline Phos 40 - 150 U/L 84 89 95  AST 5 - 34 U/L _1 ALT 0 - 55 U/L 26 26 40     STUDIES: No results found.  ASSESSMENT: 49 y.o. BRCA negative [place] woman status post left breast biopsy 07/04/2014 for a clinical T3 N0, stage IIA invasive lobular breast cancer, grade 2, estrogen receptor positive, progesterone receptor 1% "positive", with an MIB-1 of less than 5% and no HER-2 amplification  (1) genetics testing 07/29/2014 through the [employer] gene panel offered by [employer] found no deleterious mutations in ATM, BARD1, BRCA1, BRCA2, BRIP1, CDH1, CHEK2, EPCAM, MLH1, MRE11A, MSH2, MSH6, MUTYH, NBN, NF1, PALB2, PMS2, PTEN, RAD50, RAD51C, RAD51D, SMARCA4, STK11, or TP53.   (2) status post bilateral mastectomies 08/10/2014 showing  (a) on the right, lobular carcinoma in situ  (b) on the left, a pT3 pN1, stage IIIA invasive lobular breast cancer, HER-2 repeated and again negative  (3) chemotherapy started 10/11/2014,consisting of doxorubicin and cyclophosphamide in dose dense fashion 4, completed 11/22/2014, followed by paclitaxel weekly 6 started 12/06/2014. abraxane given once. To proceed with paclitaxel for the remainder of her treatments.   (4) radiation to follow chemotherapy   (5) tamoxifen was started  neoadjuvantly on 07/13/2014 given the likely delay in definitive surgery while genetics results are pending; it is being held during chemotherapy   PLAN: Graciella believes she had a worse experience with abraxane than she did on the paclitaxel. She is requesting to switch back to that medication. I consulted with Dr. Jana Hakim, and he was agreeable, but still believes she need a week off from treatment. The patient was somewhat disappointed, but understood.   Recall  that she prefers to be treated with dexamethasone, so she will be pretreated with benadryl, pepcid, and zofran alone.  She is asking to visit her radiation oncologist, Dr. Isidore Moos, "early" in case she decides she will be done with chemotherapy sooner than expected. I have placed the appropriate referral orders.  Yomaira will return in 1 week to finish out her last 5 weeks on paclitaxel, or as much of it as she can stand. She understands and agrees with this plan. She knows the goal of treatment in her case is cure. She has been encouraged to call with any issues that might arise before her next visit here.   Laurie Panda, NP   01/31/2015 5:36 PM

## 2015-01-31 NOTE — Progress Notes (Signed)
Pt in for [facility] access for labs/infusion today. Accessed with no difficulties, flushes well with no blood return. Multiple manuevers attempted with no success. Pt having labs drawn peripherally. Called and informed Natro, LPn with [employer]. TPA will be administered in MD visit.

## 2015-01-31 NOTE — Addendum Note (Signed)
Addended by: Christa See on: 01/31/2015 11:05 AM   Modules accepted: Orders

## 2015-01-31 NOTE — Patient Instructions (Signed)

## 2015-01-31 NOTE — Progress Notes (Addendum)
PAC flushed with NS and no blood return noted from [facility] by this RN. TPA administered in PAC. After 30 mins, flashback of blood return noted. Patient agreeable to wait and let TPA dwell in [facility] a little bit longer. At 1200, PAC assessed again and only 1-54mL blood return noted. PAC flushed with NS and heparin and deaccessed prior to discharge from [facility] as patient is not having treatment today.

## 2015-01-31 NOTE — Telephone Encounter (Signed)
Added an additional appointment for 03/07/15 and left message re radonc re contacting patient with an appointment to see Dr. Isidore Moos - referral in [place] will call patient. Spoke with patient she is aware.

## 2015-01-31 NOTE — Addendum Note (Signed)
Addended by: Christa See on: 01/31/2015 12:01 PM   Modules accepted: Orders, SmartSet

## 2015-02-07 ENCOUNTER — Encounter: Payer: Self-pay | Admitting: Nurse Practitioner

## 2015-02-07 ENCOUNTER — Ambulatory Visit (HOSPITAL_BASED_OUTPATIENT_CLINIC_OR_DEPARTMENT_OTHER): Payer: BLUE CROSS/BLUE SHIELD | Admitting: Nurse Practitioner

## 2015-02-07 ENCOUNTER — Other Ambulatory Visit (HOSPITAL_BASED_OUTPATIENT_CLINIC_OR_DEPARTMENT_OTHER): Payer: BLUE CROSS/BLUE SHIELD

## 2015-02-07 ENCOUNTER — Ambulatory Visit: Payer: BLUE CROSS/BLUE SHIELD

## 2015-02-07 ENCOUNTER — Encounter: Payer: Self-pay | Admitting: *Deleted

## 2015-02-07 ENCOUNTER — Ambulatory Visit (HOSPITAL_BASED_OUTPATIENT_CLINIC_OR_DEPARTMENT_OTHER): Payer: BLUE CROSS/BLUE SHIELD

## 2015-02-07 VITALS — BP 121/73 | HR 98 | Temp 98.3°F | Resp 20 | Ht 68.0 in | Wt 226.5 lb

## 2015-02-07 DIAGNOSIS — Z170 Estrogen receptor positive status [ER+]: Secondary | ICD-10-CM | POA: Diagnosis not present

## 2015-02-07 DIAGNOSIS — C50412 Malignant neoplasm of upper-outer quadrant of left female breast: Secondary | ICD-10-CM | POA: Diagnosis not present

## 2015-02-07 DIAGNOSIS — D0501 Lobular carcinoma in situ of right breast: Secondary | ICD-10-CM

## 2015-02-07 DIAGNOSIS — Z95828 Presence of other vascular implants and grafts: Secondary | ICD-10-CM

## 2015-02-07 DIAGNOSIS — Z5111 Encounter for antineoplastic chemotherapy: Secondary | ICD-10-CM | POA: Diagnosis not present

## 2015-02-07 LAB — CBC WITH DIFFERENTIAL/PLATELET
BASO%: 1.5 % (ref 0.0–2.0)
BASOS ABS: 0.1 10*3/uL (ref 0.0–0.1)
EOS ABS: 0.2 10*3/uL (ref 0.0–0.5)
EOS%: 2.7 % (ref 0.0–7.0)
HCT: 38.3 % (ref 34.8–46.6)
HGB: 12.8 g/dL (ref 11.6–15.9)
LYMPH%: 22.6 % (ref 14.0–49.7)
MCH: 30 pg (ref 25.1–34.0)
MCHC: 33.4 g/dL (ref 31.5–36.0)
MCV: 89.8 fL (ref 79.5–101.0)
MONO#: 0.7 10*3/uL (ref 0.1–0.9)
MONO%: 12.1 % (ref 0.0–14.0)
NEUT#: 3.7 10*3/uL (ref 1.5–6.5)
NEUT%: 61.1 % (ref 38.4–76.8)
PLATELETS: 284 10*3/uL (ref 145–400)
RBC: 4.26 10*6/uL (ref 3.70–5.45)
RDW: 13.8 % (ref 11.2–14.5)
WBC: 6 10*3/uL (ref 3.9–10.3)
lymph#: 1.4 10*3/uL (ref 0.9–3.3)

## 2015-02-07 LAB — COMPREHENSIVE METABOLIC PANEL
ALT: 29 U/L (ref 0–55)
ANION GAP: 9 meq/L (ref 3–11)
AST: 25 U/L (ref 5–34)
Albumin: 3.8 g/dL (ref 3.5–5.0)
Alkaline Phosphatase: 94 U/L (ref 40–150)
BUN: 9.9 mg/dL (ref 7.0–26.0)
CO2: 25 meq/L (ref 22–29)
CREATININE: 0.8 mg/dL (ref 0.6–1.1)
Calcium: 9.6 mg/dL (ref 8.4–10.4)
Chloride: 107 mEq/L (ref 98–109)
GLUCOSE: 90 mg/dL (ref 70–140)
Potassium: 4.5 mEq/L (ref 3.5–5.1)
SODIUM: 140 meq/L (ref 136–145)
TOTAL PROTEIN: 7.1 g/dL (ref 6.4–8.3)
Total Bilirubin: <0.3 mg/dL (ref 0.20–1.20)

## 2015-02-07 MED ORDER — SODIUM CHLORIDE 0.9 % IJ SOLN
10.0000 mL | INTRAMUSCULAR | Status: DC | PRN
  Administered 2015-02-07: 10 mL
  Filled 2015-02-07: qty 10

## 2015-02-07 MED ORDER — PACLITAXEL CHEMO INJECTION 300 MG/50ML
80.0000 mg/m2 | Freq: Once | INTRAVENOUS | Status: AC
  Administered 2015-02-07: 180 mg via INTRAVENOUS
  Filled 2015-02-07: qty 30

## 2015-02-07 MED ORDER — SODIUM CHLORIDE 0.9 % IJ SOLN
10.0000 mL | INTRAMUSCULAR | Status: DC | PRN
  Administered 2015-02-07: 10 mL via INTRAVENOUS
  Filled 2015-02-07: qty 10

## 2015-02-07 MED ORDER — HEPARIN SOD (PORK) LOCK FLUSH 100 UNIT/ML IV SOLN
500.0000 [IU] | Freq: Once | INTRAVENOUS | Status: AC | PRN
  Administered 2015-02-07: 500 [IU]
  Filled 2015-02-07: qty 5

## 2015-02-07 MED ORDER — SODIUM CHLORIDE 0.9 % IV SOLN
Freq: Once | INTRAVENOUS | Status: AC
  Administered 2015-02-07: 12:00:00 via INTRAVENOUS

## 2015-02-07 MED ORDER — FAMOTIDINE IN NACL 20-0.9 MG/50ML-% IV SOLN
INTRAVENOUS | Status: AC
  Filled 2015-02-07: qty 50

## 2015-02-07 MED ORDER — FAMOTIDINE IN NACL 20-0.9 MG/50ML-% IV SOLN
20.0000 mg | Freq: Once | INTRAVENOUS | Status: AC
  Administered 2015-02-07: 20 mg via INTRAVENOUS

## 2015-02-07 MED ORDER — DIPHENHYDRAMINE HCL 50 MG/ML IJ SOLN
25.0000 mg | Freq: Once | INTRAMUSCULAR | Status: AC
  Administered 2015-02-07: 25 mg via INTRAVENOUS

## 2015-02-07 MED ORDER — DIPHENHYDRAMINE HCL 50 MG/ML IJ SOLN
INTRAMUSCULAR | Status: AC
  Filled 2015-02-07: qty 1

## 2015-02-07 MED ORDER — SODIUM CHLORIDE 0.9 % IV SOLN
Freq: Once | INTRAVENOUS | Status: AC
  Administered 2015-02-07: 12:00:00 via INTRAVENOUS
  Filled 2015-02-07: qty 4

## 2015-02-07 NOTE — Patient Instructions (Signed)

## 2015-02-07 NOTE — Progress Notes (Signed)
[place]  [phone] Fax:(336) [phone]   ID: Lori [name] DOB: 1965-07-20  MR#: [id number]  TKP#:[id number]  Patient Care Team: Delilah Shan, MD as PCP - General (Family Medicine) [name], MD as Consulting Physician (General Surgery) [name], MD as Consulting Physician (Oncology) [name], MD as Attending Physician (Radiation Oncology) [name], RN as Registered Nurse [name], RN as Registered Nurse [name], NP as Nurse Practitioner (Nurse Practitioner) PCP: [name], MD GYN: [name] MD OTHER MD:  CHIEF COMPLAINT: Estrogen receptor positive breast cancer  CURRENT TREATMENT: adjuvant chemotherapy  BREAST CANCER HISTORY: From the original intake note:  Lori [name] (who is the daughter of my former patient, Lori [name], herself now 10 years out from her T1 cN0 invasive ductal carcinoma, treated with anti-estrogens only) [facility] April 20 03/23/2014. Breast density was category C. A possible mass in the left breast upper outer quadrant was felt to warrant further evaluation, and on 07/04/2014 the patient underwent left mammography with tomosynthesis and left breast ultrasonography. At this point the patient felt she was able to palpate a mass in the area in question. Tomosynthesis did reveal an irregular mass in the upper outer left breast measuring 2.3 cm, associated on physical exam with a large area of thickening. Ultrasound of the area in question confirmed an irregular mass at the 1:00 position 5 cm from the nipple measuring 2.2 cm. The left axilla was negative sonographically.  Biopsy of the mass in question the same day, 07/04/2014, showed (SAA [id number]) an invasive lobular breast cancer which was estrogen receptor 50% positive with strong staining intensity, progesterone receptor 1% positive with moderate staining intensity, with a proliferation  marker of 1%, and no HER-2 amplification, the signals ratio being 1.19 and the number per cell 1.55.  On 07/08/2014 the patient underwent bilateral breast MRI. This showed no abnormal adenopathy and no abnormality in the right breast. In the left breast however there was a 9.3 area of abnormal enhancement involving all quadrants. Within the upper outer quadrant there was a denser area which measured up to 5.1 cm.  The patient's subsequent history is as detailed below  INTERVAL HISTORY: Lori [name] returns today for follow-up of her stage II breast cancer, alone. She would like to restart paclitaxel this week, with 5 doses remaining to round out her adjuvant treatment. The diarrhea and pruritus has resolved. She is no longer having any mucositis. She still complains of fatigue and knee pain. New to our attention this week are some vision changes.   REVIEW OF SYSTEMS: A detailed review of systems is otherwise stable, except where noted above.  PAST MEDICAL HISTORY: Past Medical History  Diagnosis Date  . Asthma     last trimester of pregnancy 3 yrs ago-  never had problems since then, no meds  . Breast cancer ([place])   . Anxiety     PAST SURGICAL HISTORY: Past Surgical History  Procedure Laterality Date  . Cesarean section      x 2  . Knee arthroscopy Left 07/27/2013    Procedure: LEFT ARTHROSCOPY KNEE WITH MEDIAL MENISCAL DEBRIDEMENT;  Surgeon: Gearlean Alf, MD;  Location: [facility];  Service: Orthopedics;  Laterality: Left;  Marland Kitchen Mastectomy w/ sentinel node biopsy Left 08/10/2014  . Mastectomy w/ sentinel node biopsy Bilateral 08/10/2014    Procedure: MASTECTOMY WITH SENTINEL LYMPH NODE BIOPSY;  Surgeon: [name], MD;  Location: [facility]  OR;  Service: General;  Laterality: Bilateral;  . Portacath placement Right 09/07/2014    Procedure: INSERTION PORT-A-CATH;  Surgeon: [name], MD;  Location: [place];  Service: General;  Laterality: Right;    FAMILY HISTORY Family History    Problem Relation Age of Onset  . Breast cancer Mother 69  . Lymphoma Maternal Aunt 71  . Uterine cancer Paternal Aunt 35  . Uterine cancer Paternal Grandmother 11  . Ovarian cancer Other 49    mat great aunt through [facility]  . Ovarian cancer Maternal Grandmother 46  The patient's father died with congestive heart failure at the age of 108. The patient's mother is living, age 70. She was diagnosed with breast cancer at age 72. The patient's paternal grandmother was diagnosed with uterine cancer at age 100 and her daughter, the patient's paternal aunt, also with uterine cancer at age 44. On the mother's side one maternal aunt was diagnosed with lymphoma and the other with throat cancer. A maternal great aunt at age 82 was diagnosed with ovarian cancer.   GYNECOLOGIC HISTORY:  No LMP recorded.  menarche age 53, first live birth age 89, which the patient is aware double as the risk of breast cancer. She is GX P2. She still having regular periods. 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 Her husband Lori [name] the third (goes by "[name]") works in Engineer, technical sales, although currently he is looking for work. Their children are [name] 12 and [name] 3. The patient is not a church attender    ADVANCED DIRECTIVES: Not in place   HEALTH MAINTENANCE: Social History  Substance Use Topics  . Smoking status: Former Smoker -- 1.00 packs/day for 8 years    Types: Cigarettes    Quit date: 07/20/2000  . Smokeless tobacco: Never Used  . Alcohol Use: Yes     Comment: socially     Colonoscopy:  PAP:  Bone density:  Lipid panel:  No Known Allergies  Current Outpatient Prescriptions  Medication Sig Dispense Refill  . Docusate Sodium (DULCOLAX STOOL SOFTENER PO) Take 3 tablets by mouth daily.     Marland Kitchen ibuprofen (ADVIL,MOTRIN) 200 MG tablet Take 600 mg by mouth every 6 (six) hours as needed.     . loratadine (CLARITIN) 10 MG tablet Take 10 mg by  mouth daily.    Marland Kitchen LORazepam (ATIVAN) 0.5 MG tablet Take 1 tablet (0.5 mg total) by mouth at bedtime. 30 tablet 0  . Probiotic Product (ALIGN PO) Take 1 capsule by mouth daily.    Marland Kitchen venlafaxine XR (EFFEXOR-XR) 75 MG 24 hr capsule Take 1 capsule (75 mg total) by mouth daily with breakfast. 90 capsule 3  . zolpidem (AMBIEN) 5 MG tablet Take 5 mg by mouth at bedtime as needed for sleep.    . polyethylene glycol (MIRALAX / GLYCOLAX) packet Take 17 g by mouth daily.    . prochlorperazine (COMPAZINE) 10 MG tablet Take 1 tablet (10 mg total) by mouth every 6 (six) hours as needed (Nausea or vomiting). (Patient not taking: Reported on 12/20/2014) 30 tablet 3   No current facility-administered medications for this visit.   Facility-Administered Medications Ordered in Other Visits  Medication Dose Route Frequency Provider Last Rate Last Dose  . sodium chloride 0.9 % injection 10 mL  10 mL Intravenous PRN [name], MD   10 mL at 12/20/14 0908    OBJECTIVE: young White  woman in no acute distress Filed Vitals:   02/07/15 1019  BP: 121/73  Pulse: 98  Temp: 98.3 F (36.8 C)  Resp: 20     Body mass index is 34.45 kg/(m^2).      ECOG FS 1  Skin: warm, dry  HEENT: sclerae anicteric, conjunctivae pink, oropharynx clear. No thrush or mucositis.  Lymph Nodes: No cervical or supraclavicular lymphadenopathy  Lungs: clear to auscultation bilaterally, no rales, wheezes, or rhonci  Heart: regular rate and rhythm  Abdomen: round, soft, non tender, positive bowel sounds  Musculoskeletal: No focal spinal tenderness, no peripheral edema  Neuro: non focal, well oriented, positive affect  Breasts: deferred  LAB RESULTS:  INo results found for: SPEP, UPEP  CBC Latest Ref Rng 02/07/2015 01/31/2015 01/24/2015  WBC 3.9 - 10.3 10e3/uL 6.0 4.4 5.1  Hemoglobin 11.6 - 15.9 g/dL 12.8 12.0 12.3  Hematocrit 34.8 - 46.6 % 38.3 36.3 37.0  Platelets 145 - 400 10e3/uL 284 271 265   CMP Latest Ref Rng  02/07/2015 01/31/2015 01/24/2015  Glucose 70 - 140 mg/dl 90 94 104  BUN 7.0 - 26.0 mg/dL 9.9 10.0 13.8  Creatinine 0.6 - 1.1 mg/dL 0.8 0.7 0.7  Sodium 136 - 145 mEq/L 140 139 140  Potassium 3.5 - 5.1 mEq/L 4.5 4.2 4.1  Chloride 101 - 111 mmol/L - - -  CO2 22 - 29 mEq/L 25 25 25   Calcium 8.4 - 10.4 mg/dL 9.6 9.5 9.5  Total Protein 6.4 - 8.3 g/dL 7.1 6.7 6.7  Total Bilirubin 0.20 - 1.20 mg/dL <0.30 0.31 <0.30  Alkaline Phos 40 - 150 U/L 94 84 89  AST 5 - 34 U/L 25 21 20   ALT 0 - 55 U/L 29 26 26      STUDIES: No results found.  ASSESSMENT: 49 y.o. BRCA negative [place] woman status post left breast biopsy 07/04/2014 for a clinical T3 N0, stage IIA invasive lobular breast cancer, grade 2, estrogen receptor positive, progesterone receptor 1% "positive", with an MIB-1 of less than 5% and no HER-2 amplification  (1) genetics testing 07/29/2014 through the [employer] gene panel offered by [employer] found no deleterious mutations in ATM, BARD1, BRCA1, BRCA2, BRIP1, CDH1, CHEK2, EPCAM, MLH1, MRE11A, MSH2, MSH6, MUTYH, NBN, NF1, PALB2, PMS2, PTEN, RAD50, RAD51C, RAD51D, SMARCA4, STK11, or TP53.   (2) status post bilateral mastectomies 08/10/2014 showing  (a) on the right, lobular carcinoma in situ  (b) on the left, a pT3 pN1, stage IIIA invasive lobular breast cancer, HER-2 repeated and again negative  (3) chemotherapy started 10/11/2014,consisting of doxorubicin and cyclophosphamide in dose dense fashion 4, completed 11/22/2014, followed by paclitaxel weekly 6 started 12/06/2014. Abraxane given once, and tolerated poorly. To proceed with paclitaxel for the remainder of her treatments.   (4) radiation to follow chemotherapy   (5) tamoxifen was started neoadjuvantly on 07/13/2014 given the likely delay in definitive surgery while genetics results are pending; it is being held during chemotherapy   PLAN: Lorissa is fairly stable today, with the usual symptoms. The labs were reviewed in  detail and were entirely normal. She will restart paclitaxel today, the first of 5 doses since trying abraxane.   We discussed her dosing schedule. I mention that we could work it out so that she was treated 2 or 3 weeks on, with 1 week off. Citing financial concerns and being out of work, she declined. She would rather be finished in the 5 expected week.   Chrishonda will return in 1 week for  her next cycle of treatment. She understands and agrees with this plan. She knows the goal of treatment in her case is cure. She has been encouraged to call with any issues that might arise before her next visit here.    Total time spent in appointment was 25 minutes, with greater than 50% of the time spent face to face with the patient.   Laurie Panda, NP   02/07/2015 11:29 AM

## 2015-02-07 NOTE — Patient Instructions (Signed)
Cancer Center Discharge Instructions for Patients Receiving Chemotherapy  Today you received the following chemotherapy agents Taxol   To help prevent nausea and vomiting after your treatment, we encourage you to take your nausea medication as directed.   If you develop nausea and vomiting that is not controlled by your nausea medication, call the clinic.   BELOW ARE SYMPTOMS THAT SHOULD BE REPORTED IMMEDIATELY:  *FEVER GREATER THAN 100.5 F  *CHILLS WITH OR WITHOUT FEVER  NAUSEA AND VOMITING THAT IS NOT CONTROLLED WITH YOUR NAUSEA MEDICATION  *UNUSUAL SHORTNESS OF BREATH  *UNUSUAL BRUISING OR BLEEDING  TENDERNESS IN MOUTH AND THROAT WITH OR WITHOUT PRESENCE OF ULCERS  *URINARY PROBLEMS  *BOWEL PROBLEMS  UNUSUAL RASH Items with * indicate a potential emergency and should be followed up as soon as possible.  Feel free to call the clinic you have any questions or concerns. The clinic phone number is [phone].  Please show the CHEMO ALERT CARD at check-in to the Emergency Department and triage nurse.   

## 2015-02-14 ENCOUNTER — Ambulatory Visit: Payer: BLUE CROSS/BLUE SHIELD

## 2015-02-14 ENCOUNTER — Other Ambulatory Visit: Payer: Self-pay | Admitting: *Deleted

## 2015-02-14 ENCOUNTER — Other Ambulatory Visit (HOSPITAL_BASED_OUTPATIENT_CLINIC_OR_DEPARTMENT_OTHER): Payer: BLUE CROSS/BLUE SHIELD

## 2015-02-14 ENCOUNTER — Encounter: Payer: Self-pay | Admitting: Nurse Practitioner

## 2015-02-14 ENCOUNTER — Ambulatory Visit (HOSPITAL_BASED_OUTPATIENT_CLINIC_OR_DEPARTMENT_OTHER): Payer: BLUE CROSS/BLUE SHIELD | Admitting: Nurse Practitioner

## 2015-02-14 ENCOUNTER — Telehealth: Payer: Self-pay | Admitting: Nurse Practitioner

## 2015-02-14 ENCOUNTER — Ambulatory Visit (HOSPITAL_BASED_OUTPATIENT_CLINIC_OR_DEPARTMENT_OTHER): Payer: BLUE CROSS/BLUE SHIELD

## 2015-02-14 ENCOUNTER — Other Ambulatory Visit: Payer: Self-pay | Admitting: Oncology

## 2015-02-14 VITALS — BP 118/67 | HR 78 | Temp 97.8°F | Resp 18 | Ht 68.0 in | Wt 227.4 lb

## 2015-02-14 DIAGNOSIS — Z95828 Presence of other vascular implants and grafts: Secondary | ICD-10-CM

## 2015-02-14 DIAGNOSIS — Z170 Estrogen receptor positive status [ER+]: Secondary | ICD-10-CM

## 2015-02-14 DIAGNOSIS — Z5111 Encounter for antineoplastic chemotherapy: Secondary | ICD-10-CM

## 2015-02-14 DIAGNOSIS — C50412 Malignant neoplasm of upper-outer quadrant of left female breast: Secondary | ICD-10-CM

## 2015-02-14 DIAGNOSIS — D0501 Lobular carcinoma in situ of right breast: Secondary | ICD-10-CM

## 2015-02-14 DIAGNOSIS — G4700 Insomnia, unspecified: Secondary | ICD-10-CM

## 2015-02-14 LAB — CBC WITH DIFFERENTIAL/PLATELET
BASO%: 1.6 % (ref 0.0–2.0)
Basophils Absolute: 0.1 10*3/uL (ref 0.0–0.1)
EOS ABS: 0.2 10*3/uL (ref 0.0–0.5)
EOS%: 4 % (ref 0.0–7.0)
HEMATOCRIT: 36.8 % (ref 34.8–46.6)
HGB: 12.4 g/dL (ref 11.6–15.9)
LYMPH%: 26.5 % (ref 14.0–49.7)
MCH: 30.2 pg (ref 25.1–34.0)
MCHC: 33.7 g/dL (ref 31.5–36.0)
MCV: 89.5 fL (ref 79.5–101.0)
MONO#: 0.3 10*3/uL (ref 0.1–0.9)
MONO%: 5.8 % (ref 0.0–14.0)
NEUT%: 62.1 % (ref 38.4–76.8)
NEUTROS ABS: 3.1 10*3/uL (ref 1.5–6.5)
NRBC: 1 % — AB (ref 0–0)
PLATELETS: 230 10*3/uL (ref 145–400)
RBC: 4.11 10*6/uL (ref 3.70–5.45)
RDW: 12.9 % (ref 11.2–14.5)
WBC: 5 10*3/uL (ref 3.9–10.3)
lymph#: 1.3 10*3/uL (ref 0.9–3.3)

## 2015-02-14 LAB — COMPREHENSIVE METABOLIC PANEL
ALT: 27 U/L (ref 0–55)
ANION GAP: 7 meq/L (ref 3–11)
AST: 21 U/L (ref 5–34)
Albumin: 3.6 g/dL (ref 3.5–5.0)
Alkaline Phosphatase: 87 U/L (ref 40–150)
BUN: 10.7 mg/dL (ref 7.0–26.0)
CALCIUM: 9.3 mg/dL (ref 8.4–10.4)
CHLORIDE: 107 meq/L (ref 98–109)
CO2: 25 meq/L (ref 22–29)
Creatinine: 0.7 mg/dL (ref 0.6–1.1)
EGFR: >90 mL/min/{1.73_m2} (ref >90)
Glucose: 96 mg/dl (ref 70–140)
POTASSIUM: 4.4 meq/L (ref 3.5–5.1)
Sodium: 139 mEq/L (ref 136–145)
Total Bilirubin: <0.3 mg/dL (ref 0.20–1.20)
Total Protein: 6.7 g/dL (ref 6.4–8.3)

## 2015-02-14 MED ORDER — FAMOTIDINE IN NACL 20-0.9 MG/50ML-% IV SOLN
INTRAVENOUS | Status: AC
  Filled 2015-02-14: qty 50

## 2015-02-14 MED ORDER — SODIUM CHLORIDE 0.9 % IV SOLN
Freq: Once | INTRAVENOUS | Status: AC
  Administered 2015-02-14: 11:00:00 via INTRAVENOUS
  Filled 2015-02-14: qty 4

## 2015-02-14 MED ORDER — ZOLPIDEM TARTRATE 5 MG PO TABS: 5.0000 mg | ORAL_TABLET | Freq: Every evening | ORAL | Status: DC | PRN

## 2015-02-14 MED ORDER — PACLITAXEL CHEMO INJECTION 300 MG/50ML
80.0000 mg/m2 | Freq: Once | INTRAVENOUS | Status: AC
  Administered 2015-02-14: 180 mg via INTRAVENOUS
  Filled 2015-02-14: qty 30

## 2015-02-14 MED ORDER — HEPARIN SOD (PORK) LOCK FLUSH 100 UNIT/ML IV SOLN
500.0000 [IU] | Freq: Once | INTRAVENOUS | Status: AC | PRN
  Administered 2015-02-14: 500 [IU]
  Filled 2015-02-14: qty 5

## 2015-02-14 MED ORDER — FAMOTIDINE IN NACL 20-0.9 MG/50ML-% IV SOLN
20.0000 mg | Freq: Once | INTRAVENOUS | Status: AC
  Administered 2015-02-14: 20 mg via INTRAVENOUS

## 2015-02-14 MED ORDER — DIPHENHYDRAMINE HCL 50 MG/ML IJ SOLN
25.0000 mg | Freq: Once | INTRAMUSCULAR | Status: AC
  Administered 2015-02-14: 25 mg via INTRAVENOUS

## 2015-02-14 MED ORDER — SODIUM CHLORIDE 0.9 % IJ SOLN
10.0000 mL | INTRAMUSCULAR | Status: DC | PRN
  Administered 2015-02-14: 10 mL via INTRAVENOUS
  Filled 2015-02-14: qty 10

## 2015-02-14 MED ORDER — SODIUM CHLORIDE 0.9 % IJ SOLN
10.0000 mL | INTRAMUSCULAR | Status: DC | PRN
  Administered 2015-02-14: 10 mL
  Filled 2015-02-14: qty 10

## 2015-02-14 MED ORDER — SODIUM CHLORIDE 0.9 % IV SOLN
Freq: Once | INTRAVENOUS | Status: AC
  Administered 2015-02-14: 10:00:00 via INTRAVENOUS

## 2015-02-14 MED ORDER — DIPHENHYDRAMINE HCL 50 MG/ML IJ SOLN
INTRAMUSCULAR | Status: AC
  Filled 2015-02-14: qty 1

## 2015-02-14 NOTE — Patient Instructions (Signed)
[facility] Discharge Instructions for Patients Receiving Chemotherapy  Today you received the following chemotherapy agents: Taxol.  To help prevent nausea and vomiting after your treatment, we encourage you to take your nausea medication : Compazine 10 mg every 6 hours as needed.   If you develop nausea and vomiting that is not controlled by your nausea medication, call the clinic.   BELOW ARE SYMPTOMS THAT SHOULD BE REPORTED IMMEDIATELY:  *FEVER GREATER THAN 100.5 F  *CHILLS WITH OR WITHOUT FEVER  NAUSEA AND VOMITING THAT IS NOT CONTROLLED WITH YOUR NAUSEA MEDICATION  *UNUSUAL SHORTNESS OF BREATH  *UNUSUAL BRUISING OR BLEEDING  TENDERNESS IN MOUTH AND THROAT WITH OR WITHOUT PRESENCE OF ULCERS  *URINARY PROBLEMS  *BOWEL PROBLEMS  UNUSUAL RASH Items with * indicate a potential emergency and should be followed up as soon as possible.  Feel free to call the clinic you have any questions or concerns. The clinic phone number is [phone].  Please show the CHEMO ALERT CARD at check-in to the Emergency Department and triage nurse.   

## 2015-02-14 NOTE — Progress Notes (Signed)
[name]  Telephone:(336) [phone] Fax:(336) [phone]   ID: Lori [name] DOB: 17-Apr-1965  MR#: [id number]  DPO#:[id number]  Patient Care Team: Delilah Shan, MD as PCP - General (Family Medicine) [name], MD as Consulting Physician (General Surgery) [name], MD as Consulting Physician (Oncology) [name], MD as Attending Physician (Radiation Oncology) [name], RN as Registered Nurse [name], RN as Registered Nurse [name], NP as Nurse Practitioner (Nurse Practitioner) PCP: [name], MD GYN: [name] MD OTHER MD:  CHIEF COMPLAINT: Estrogen receptor positive breast cancer  CURRENT TREATMENT: adjuvant chemotherapy  BREAST CANCER HISTORY: From the original intake note:  Lori [name] (who is the daughter of my former patient, Lori [name], herself now 10 years out from her T1 cN0 invasive ductal carcinoma, treated with anti-estrogens only) went for routine screening mammography at [facility] April 20 03/23/2014. Breast density was category C. A possible mass in the left breast upper outer quadrant was felt to warrant further evaluation, and on 07/04/2014 the patient underwent left mammography with tomosynthesis and left breast ultrasonography. At this point the patient felt she was able to palpate a mass in the area in question. Tomosynthesis did reveal an irregular mass in the upper outer left breast measuring 2.3 cm, associated on physical exam with a large area of thickening. Ultrasound of the area in question confirmed an irregular mass at the 1:00 position 5 cm from the nipple measuring 2.2 cm. The left axilla was negative sonographically.  Biopsy of the mass in question the same day, 07/04/2014, showed (SAA [id number]) an invasive lobular breast cancer which was estrogen receptor 50% positive with strong staining intensity, progesterone receptor 1% positive with moderate staining intensity, with a proliferation  marker of 1%, and no HER-2 amplification, the signals ratio being 1.19 and the number per cell 1.55.  On 07/08/2014 the patient underwent bilateral breast MRI. This showed no abnormal adenopathy and no abnormality in the right breast. In the left breast however there was a 9.3 area of abnormal enhancement involving all quadrants. Within the upper outer quadrant there was a denser area which measured up to 5.1 cm.  The patient's subsequent history is as detailed below  INTERVAL HISTORY: Lori [name] returns today for follow-up of her stage II breast cancer, alone. She is due for cycle 8 of paclitaxel this week. She will do a total of 11 doses of this drug (having received 1 cycle of abraxane) to round out her adjuvant chemotherapy.  REVIEW OF SYSTEMS: [name] has found relief with aleve over ibuprofen, so she has made the switch and is now on 559m BID. Her knees no longer have the intense throbbing at night. She does not sleep well despite this finding. Relying heavily on ambien or lorazepam. Her gums are inflamed as usual. She is using cold foods to soothe as valacyclovir and magic mouthwash are unhelpful. She takes a stool softener to stay regular. A detailed review of systems is otherwise stable.  PAST MEDICAL HISTORY: Past Medical History  Diagnosis Date  . Asthma     last trimester of pregnancy 3 yrs ago-  never had problems since then, no meds  . [place]   . Anxiety     PAST SURGICAL HISTORY: Past Surgical History  Procedure Laterality Date  . Cesarean section      x 2  . Knee arthroscopy Left 07/27/2013    Procedure: LEFT ARTHROSCOPY KNEE WITH MEDIAL MENISCAL DEBRIDEMENT;  Surgeon:  Gearlean Alf, MD;  Location: [facility];  Service: Orthopedics;  Laterality: Left;  Marland Kitchen Mastectomy w/ sentinel node biopsy Left 08/10/2014  . Mastectomy w/ sentinel node biopsy Bilateral 08/10/2014    Procedure: MASTECTOMY WITH SENTINEL LYMPH NODE BIOPSY;  Surgeon: [name], MD;  Location: [place];   Service: General;  Laterality: Bilateral;  . Portacath placement Right 09/07/2014    Procedure: INSERTION PORT-A-CATH;  Surgeon: [name], MD;  Location: [place];  Service: General;  Laterality: Right;    FAMILY HISTORY Family History  Problem Relation Age of Onset  . Breast cancer Mother 27  . Lymphoma Maternal Aunt 71  . Uterine cancer Paternal Aunt 32  . Uterine cancer Paternal Grandmother 66  . Ovarian cancer Other 54    mat great aunt through [facility]  . Ovarian cancer Maternal Grandmother 60  The patient's father died with congestive heart failure at the age of 5. The patient's mother is living, age 78. She was diagnosed with breast cancer at age 33. The patient's paternal grandmother was diagnosed with uterine cancer at age 3 and her daughter, the patient's paternal aunt, also with uterine cancer at age 60. On the mother's side one maternal aunt was diagnosed with lymphoma and the other with throat cancer. A maternal great aunt at age 69 was diagnosed with ovarian cancer.   GYNECOLOGIC HISTORY:  No LMP recorded.  menarche age 46, first live birth age 55, which the patient is aware double as the risk of breast cancer. She is GX P2. She still having regular periods. 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 Her husband Lori [name] the third (goes by "Lori [name]") works in Engineer, technical sales, although currently he is looking for work. Their children are Lori [name] 12 and Lori [name] 3. The patient is not a church attender    ADVANCED DIRECTIVES: Not in place   HEALTH MAINTENANCE: Social History  Substance Use Topics  . Smoking status: Former Smoker -- 1.00 packs/day for 8 years    Types: Cigarettes    Quit date: 07/20/2000  . Smokeless tobacco: Never Used  . Alcohol Use: Yes     Comment: socially     Colonoscopy:  PAP:  Bone density:  Lipid panel:  No Known Allergies  Current Outpatient Prescriptions    Medication Sig Dispense Refill  . Docusate Sodium (DULCOLAX STOOL SOFTENER PO) Take 3 tablets by mouth daily.     Marland Kitchen LORazepam (ATIVAN) 0.5 MG tablet Take 1 tablet (0.5 mg total) by mouth at bedtime. 30 tablet 0  . naproxen sodium (ANAPROX) 220 MG tablet Take 220 mg by mouth 2 (two) times daily with a meal.    . Probiotic Product (ALIGN PO) Take 1 capsule by mouth daily.    Marland Kitchen venlafaxine XR (EFFEXOR-XR) 75 MG 24 hr capsule Take 1 capsule (75 mg total) by mouth daily with breakfast. 90 capsule 3  . ibuprofen (ADVIL,MOTRIN) 200 MG tablet Take 600 mg by mouth every 6 (six) hours as needed.     . polyethylene glycol (MIRALAX / GLYCOLAX) packet Take 17 g by mouth daily.    . prochlorperazine (COMPAZINE) 10 MG tablet Take 1 tablet (10 mg total) by mouth every 6 (six) hours as needed (Nausea or vomiting). (Patient not taking: Reported on 12/20/2014) 30 tablet 3  . zolpidem (AMBIEN) 5 MG tablet Take 1 tablet (5 mg total) by mouth at bedtime as needed for  sleep. 30 tablet 0   No current facility-administered medications for this visit.   Facility-Administered Medications Ordered in Other Visits  Medication Dose Route Frequency Provider Last Rate Last Dose  . sodium chloride 0.9 % injection 10 mL  10 mL Intravenous PRN [name], MD   10 mL at 12/20/14 0908    OBJECTIVE: young White woman in no acute distress Filed Vitals:   02/14/15 0922  BP: 118/67  Pulse: 78  Temp: 97.8 F (36.6 C)  Resp: 18     Body mass index is 34.58 kg/(m^2).      ECOG FS 1  Sclerae unicteric, pupils round and equal Oropharynx clear and moist-- no thrush or other lesions No cervical or supraclavicular adenopathy Lungs no rales or rhonchi Heart regular rate and rhythm Abd soft, nontender, positive bowel sounds MSK no focal spinal tenderness, no upper extremity lymphedema Neuro: nonfocal, well oriented, appropriate affect Breasts: deferred  LAB RESULTS:  INo results found for: SPEP, UPEP  CBC Latest  Ref Rng 02/14/2015 02/07/2015 01/31/2015  WBC 3.9 - 10.3 10e3/uL 5.0 6.0 4.4  Hemoglobin 11.6 - 15.9 g/dL 12.4 12.8 12.0  Hematocrit 34.8 - 46.6 % 36.8 38.3 36.3  Platelets 145 - 400 10e3/uL 230 284 271   CMP Latest Ref Rng 02/14/2015 02/07/2015 01/31/2015  Glucose 70 - 140 mg/dl 96 90 94  BUN 7.0 - 26.0 mg/dL 10.7 9.9 10.0  Creatinine 0.6 - 1.1 mg/dL 0.7 0.8 0.7  Sodium 136 - 145 mEq/L 139 140 139  Potassium 3.5 - 5.1 mEq/L 4.4 4.5 4.2  Chloride 101 - 111 mmol/L - - -  CO2 22 - 29 mEq/L _0 Calcium 8.4 - 10.4 mg/dL 9.3 9.6 9.5  Total Protein 6.4 - 8.3 g/dL 6.7 7.1 6.7  Total Bilirubin 0.20 - 1.20 mg/dL <0.30 <0.30 0.31  Alkaline Phos 40 - 150 U/L 87 94 84  AST 5 - 34 U/L _1 ALT 0 - 55 U/L _2 STUDIES: No results found.  ASSESSMENT: 49 y.o. BRCA negative [place] woman status post left breast biopsy 07/04/2014 for a clinical T3 N0, stage IIA invasive lobular breast cancer, grade 2, estrogen receptor positive, progesterone receptor 1% "positive", with an MIB-1 of less than 5% and no HER-2 amplification  (1) genetics testing 07/29/2014 through the [employer] gene panel offered by [employer] found no deleterious mutations in ATM, BARD1, BRCA1, BRCA2, BRIP1, CDH1, CHEK2, EPCAM, MLH1, MRE11A, MSH2, MSH6, MUTYH, NBN, NF1, PALB2, PMS2, PTEN, RAD50, RAD51C, RAD51D, SMARCA4, STK11, or TP53.   (2) status post bilateral mastectomies 08/10/2014 showing  (a) on the right, lobular carcinoma in situ  (b) on the left, a pT3 pN1, stage IIIA invasive lobular breast cancer, HER-2 repeated and again negative  (3) chemotherapy started 10/11/2014,consisting of doxorubicin and cyclophosphamide in dose dense fashion 4, completed 11/22/2014, followed by paclitaxel weekly 6 started 12/06/2014. Abraxane given once, and tolerated poorly. To proceed with paclitaxel for the remainder of her treatments.   (4) radiation to follow chemotherapy   (5) tamoxifen was started neoadjuvantly  on 07/13/2014 given the likely delay in definitive surgery while genetics results are pending; it is being held during chemotherapy   PLAN: Morgen is "holding her own" today. The labs were reviewed in detail and were completely normal. She will proceed with cycle 8 of paclitaxel as planned today.  She will continue on aleve for her joint pain. I have refilled the ambien for her  insomnia, but she may try benadryl for sleep as well this week.   Quanesha will return in 1 week for cycle 9 of treatment. She understands and agrees with this plan. She knows the goal of treatment in her case is cure. She has been encouraged to call with any issues that might arise before her next visit here.  Laurie Panda, NP   02/14/2015 10:10 AM

## 2015-02-14 NOTE — Telephone Encounter (Signed)
Appointments made and avs will be printed in chemo  °

## 2015-02-14 NOTE — Patient Instructions (Signed)

## 2015-02-16 NOTE — Progress Notes (Addendum)
Location of Breast Cancer: Left and Right Breast  Histology per Pathology Report:  07/04/14 Diagnosis Breast, left, needle core biopsy, 1 o'clock - INVASIVE AND IN SITU MAMMARY CARCINOMA.  08/10/14 Diagnosis 1. Lymph node, sentinel, biopsy, left axillary #1 - ONE LYMPH NODE POSITIVE FOR METASTATIC LOBULAR CARCINOMA (1/1). 2. Lymph node, sentinel, biopsy, left axillary #2 - ONE BENIGN LYMPH NODE WITH NO TUMOR SEEN (0/1). - SEE COMMENT. 3. Lymph node, sentinel, biopsy, left axillary #3 - ONE BENIGN LYMPH NODE WITH NO TUMOR SEEN (0/1). 4. Lymph node, sentinel, biopsy, left axillary #4 - ONE LYMPH NODE POSITIVE FOR METASTATIC LOBULAR CARCINOMA (1/1). 5. Breast, simple mastectomy, right - BREAST PARENCHYMA WITH LOBULAR NEOPLASIA (LOBULAR CARCINOMA IN SITU). 6. Breast, simple mastectomy, left - INVASIVE GRADE II LOBULAR CARCINOMA, MEASURING 8.2 CM IN GREATEST DIMENSION. - ASSOCIATED LOBULAR CARCINOMA IN SITU. - MARGINS ARE NEGATIVE. - SEE ONCOLOGY TEMPLATE. 7. Lymph nodes, regional resection, left axillary contents - ONE LYMPH NODE WITH ISOLATED TUMOR CELLS PRESENT. - SEVEN BENIGN LYMPH NODES WITH NO TUMOR SEE (0/7).  Receptor Status: ER(50% POS), PR (1% POS), Her2-neu (NEG)  Did patient present with symptoms or was this found on screening mammography?: It was found on a screening mammogram 06/22/14.  Past/Anticipated interventions by surgeon, if any:  . Mastectomy w/ sentinel node biopsy Left 08/10/2014  . Mastectomy w/ sentinel node biopsy Bilateral 08/10/2014    Procedure: MASTECTOMY WITH SENTINEL LYMPH NODE BIOPSY; Surgeon: [name], MD; Location: [facility]; Service: General; Laterality: Bilateral;         Past/Anticipated interventions by medical oncology, if any: Chemotherapy started 10/11/2014,consisting of doxorubicin and cyclophosphamide in dose dense fashion 4, completed 11/22/2014, followed by paclitaxel weekly 6 started 12/06/2014. Abraxane given once, and  tolerated poorly. To proceed with paclitaxel for the remainder of her treatments. She has 2 more treatments planned and will end 03/07/15.    Lymphedema issues, if any:  No, though she does state she has concerns about swelling in her upper abdomen  Pain issues, if any:  None  SAFETY ISSUES:  Prior radiation? No  Pacemaker/ICD? No  Possible current pregnancy? No  Is the patient on methotrexate? No  Current Complaints / other details:  None  BP 120/73 mmHg  Pulse 73  Temp(Src) 98 F (36.7 C)  Wt 234 lb 6.4 oz (106.323 kg)   Wt Readings from Last 3 Encounters:  02/22/15 234 lb 6.4 oz (106.323 kg)  02/21/15 230 lb 3.2 oz (104.418 kg)  02/14/15 227 lb 6.4 oz (103.148 kg)      Karle Desrosier, Stephani Police, RN 02/16/2015,3:04 PM

## 2015-02-21 ENCOUNTER — Encounter: Payer: Self-pay | Admitting: *Deleted

## 2015-02-21 ENCOUNTER — Other Ambulatory Visit (HOSPITAL_BASED_OUTPATIENT_CLINIC_OR_DEPARTMENT_OTHER): Payer: BLUE CROSS/BLUE SHIELD

## 2015-02-21 ENCOUNTER — Ambulatory Visit (HOSPITAL_BASED_OUTPATIENT_CLINIC_OR_DEPARTMENT_OTHER): Payer: BLUE CROSS/BLUE SHIELD | Admitting: Nurse Practitioner

## 2015-02-21 ENCOUNTER — Ambulatory Visit: Payer: BLUE CROSS/BLUE SHIELD

## 2015-02-21 ENCOUNTER — Encounter: Payer: Self-pay | Admitting: Nurse Practitioner

## 2015-02-21 ENCOUNTER — Ambulatory Visit (HOSPITAL_BASED_OUTPATIENT_CLINIC_OR_DEPARTMENT_OTHER): Payer: BLUE CROSS/BLUE SHIELD

## 2015-02-21 VITALS — BP 127/70 | HR 74 | Temp 97.9°F | Resp 18 | Ht 68.0 in | Wt 230.2 lb

## 2015-02-21 DIAGNOSIS — C50412 Malignant neoplasm of upper-outer quadrant of left female breast: Secondary | ICD-10-CM | POA: Diagnosis not present

## 2015-02-21 DIAGNOSIS — D0501 Lobular carcinoma in situ of right breast: Secondary | ICD-10-CM | POA: Diagnosis not present

## 2015-02-21 DIAGNOSIS — Z5111 Encounter for antineoplastic chemotherapy: Secondary | ICD-10-CM | POA: Diagnosis not present

## 2015-02-21 DIAGNOSIS — Z170 Estrogen receptor positive status [ER+]: Secondary | ICD-10-CM

## 2015-02-21 DIAGNOSIS — Z95828 Presence of other vascular implants and grafts: Secondary | ICD-10-CM

## 2015-02-21 LAB — COMPREHENSIVE METABOLIC PANEL
ALT: 31 U/L (ref 0–55)
ANION GAP: 6 meq/L (ref 3–11)
AST: 21 U/L (ref 5–34)
Albumin: 3.5 g/dL (ref 3.5–5.0)
Alkaline Phosphatase: 80 U/L (ref 40–150)
BUN: 11.5 mg/dL (ref 7.0–26.0)
CHLORIDE: 109 meq/L (ref 98–109)
CO2: 26 meq/L (ref 22–29)
CREATININE: 0.7 mg/dL (ref 0.6–1.1)
Calcium: 9.2 mg/dL (ref 8.4–10.4)
EGFR: >90 mL/min/{1.73_m2} (ref >90)
Glucose: 85 mg/dl (ref 70–140)
Potassium: 4.3 mEq/L (ref 3.5–5.1)
SODIUM: 141 meq/L (ref 136–145)
Total Bilirubin: <0.3 mg/dL (ref 0.20–1.20)
Total Protein: 6.5 g/dL (ref 6.4–8.3)

## 2015-02-21 LAB — CBC WITH DIFFERENTIAL/PLATELET
BASO%: 1.8 % (ref 0.0–2.0)
Basophils Absolute: 0.1 10*3/uL (ref 0.0–0.1)
EOS%: 4.7 % (ref 0.0–7.0)
Eosinophils Absolute: 0.2 10*3/uL (ref 0.0–0.5)
HCT: 35.2 % (ref 34.8–46.6)
HGB: 11.6 g/dL (ref 11.6–15.9)
LYMPH%: 25.1 % (ref 14.0–49.7)
MCH: 29.4 pg (ref 25.1–34.0)
MCHC: 32.9 g/dL (ref 31.5–36.0)
MCV: 89.4 fL (ref 79.5–101.0)
MONO#: 0.3 10*3/uL (ref 0.1–0.9)
MONO%: 7.8 % (ref 0.0–14.0)
NEUT#: 2.6 10*3/uL (ref 1.5–6.5)
NEUT%: 60.6 % (ref 38.4–76.8)
Platelets: 219 10*3/uL (ref 145–400)
RBC: 3.94 10*6/uL (ref 3.70–5.45)
RDW: 13.9 % (ref 11.2–14.5)
WBC: 4.3 10*3/uL (ref 3.9–10.3)
lymph#: 1.1 10*3/uL (ref 0.9–3.3)

## 2015-02-21 MED ORDER — SODIUM CHLORIDE 0.9 % IV SOLN
Freq: Once | INTRAVENOUS | Status: AC
  Administered 2015-02-21: 11:00:00 via INTRAVENOUS
  Filled 2015-02-21: qty 4

## 2015-02-21 MED ORDER — SODIUM CHLORIDE 0.9 % IV SOLN
Freq: Once | INTRAVENOUS | Status: AC
  Administered 2015-02-21: 10:00:00 via INTRAVENOUS

## 2015-02-21 MED ORDER — SODIUM CHLORIDE 0.9 % IJ SOLN
10.0000 mL | INTRAMUSCULAR | Status: DC | PRN
  Administered 2015-02-21: 10 mL
  Filled 2015-02-21: qty 10

## 2015-02-21 MED ORDER — DIPHENHYDRAMINE HCL 50 MG/ML IJ SOLN
25.0000 mg | Freq: Once | INTRAMUSCULAR | Status: AC
  Administered 2015-02-21: 25 mg via INTRAVENOUS

## 2015-02-21 MED ORDER — PACLITAXEL CHEMO INJECTION 300 MG/50ML
80.0000 mg/m2 | Freq: Once | INTRAVENOUS | Status: AC
  Administered 2015-02-21: 180 mg via INTRAVENOUS
  Filled 2015-02-21: qty 30

## 2015-02-21 MED ORDER — SODIUM CHLORIDE 0.9 % IJ SOLN
10.0000 mL | INTRAMUSCULAR | Status: DC | PRN
  Administered 2015-02-21: 10 mL via INTRAVENOUS
  Filled 2015-02-21: qty 10

## 2015-02-21 MED ORDER — HEPARIN SOD (PORK) LOCK FLUSH 100 UNIT/ML IV SOLN
500.0000 [IU] | Freq: Once | INTRAVENOUS | Status: AC | PRN
  Administered 2015-02-21: 500 [IU]
  Filled 2015-02-21: qty 5

## 2015-02-21 MED ORDER — DIPHENHYDRAMINE HCL 50 MG/ML IJ SOLN
INTRAMUSCULAR | Status: AC
  Filled 2015-02-21: qty 1

## 2015-02-21 MED ORDER — FAMOTIDINE IN NACL 20-0.9 MG/50ML-% IV SOLN
INTRAVENOUS | Status: AC
  Filled 2015-02-21: qty 50

## 2015-02-21 MED ORDER — FAMOTIDINE IN NACL 20-0.9 MG/50ML-% IV SOLN
20.0000 mg | Freq: Once | INTRAVENOUS | Status: AC
  Administered 2015-02-21: 20 mg via INTRAVENOUS

## 2015-02-21 NOTE — Patient Instructions (Signed)
Cancer Center Discharge Instructions for Patients Receiving Chemotherapy  Today you received the following chemotherapy agents: Taxol.  To help prevent nausea and vomiting after your treatment, we encourage you to take your nausea medication : Compazine 10 mg every 6 hours as needed.   If you develop nausea and vomiting that is not controlled by your nausea medication, call the clinic.   BELOW ARE SYMPTOMS THAT SHOULD BE REPORTED IMMEDIATELY:  *FEVER GREATER THAN 100.5 F  *CHILLS WITH OR WITHOUT FEVER  NAUSEA AND VOMITING THAT IS NOT CONTROLLED WITH YOUR NAUSEA MEDICATION  *UNUSUAL SHORTNESS OF BREATH  *UNUSUAL BRUISING OR BLEEDING  TENDERNESS IN MOUTH AND THROAT WITH OR WITHOUT PRESENCE OF ULCERS  *URINARY PROBLEMS  *BOWEL PROBLEMS  UNUSUAL RASH Items with * indicate a potential emergency and should be followed up as soon as possible.  Feel free to call the clinic you have any questions or concerns. The clinic phone number is [phone].  Please show the CHEMO ALERT CARD at check-in to the Emergency Department and triage nurse.   

## 2015-02-21 NOTE — Patient Instructions (Signed)

## 2015-02-21 NOTE — Progress Notes (Signed)
[place]  Telephone:([phone]) [phone] [phone]) [phone]   ID: ALIANNY TOELLE DOB: [birth year]  MR#: [id number]  LYY#:[id number]  Patient Care Team: Delilah Shan, MD as PCP - General (Family Medicine) [name], MD as Consulting Physician (General Surgery) [name], MD as Consulting Physician (Oncology) [name], MD as Attending Physician (Radiation Oncology) [name], RN as Registered Nurse [name], RN as Registered Nurse [name], NP as Nurse Practitioner (Nurse Practitioner) PCP: [name], MD GYN: [name] MD OTHER MD:  CHIEF COMPLAINT: Estrogen receptor positive breast cancer  CURRENT TREATMENT: adjuvant chemotherapy  BREAST CANCER HISTORY: From the original intake note:  Lori [name] (who is the daughter of my former patient, Lori [name], herself now 10 years out from her T1 cN0 invasive ductal carcinoma, treated with anti-estrogens only) went [facility] April 20 03/23/2014. Breast density was category C. A possible mass in the left breast upper outer quadrant was felt to warrant further evaluation, and on 07/04/2014 the patient underwent left mammography with tomosynthesis and left breast ultrasonography. At this point the patient felt she was able to palpate a mass in the area in question. Tomosynthesis did reveal an irregular mass in the upper outer left breast measuring 2.3 cm, associated on physical exam with a large area of thickening. Ultrasound of the area in question confirmed an irregular mass at the 1:00 position 5 cm from the nipple measuring 2.2 cm. The left axilla was negative sonographically.  Biopsy of the mass in question the same day, 07/04/2014, showed (SAA [phone]) an invasive lobular breast cancer which was estrogen receptor 50% positive with strong staining intensity, progesterone receptor 1% positive with moderate staining intensity, with a proliferation  marker of 1%, and no HER-2 amplification, the signals ratio being 1.19 and the number per cell 1.55.  On 07/08/2014 the patient underwent bilateral breast MRI. This showed no abnormal adenopathy and no abnormality in the right breast. In the left breast however there was a 9.3 area of abnormal enhancement involving all quadrants. Within the upper outer quadrant there was a denser area which measured up to 5.1 cm.  The patient's subsequent history is as detailed below  INTERVAL HISTORY: Lori [name] returns today for follow-up of her stage II breast cancer, alone. She is due for cycle 9 of paclitaxel this week. She will do a total of 11 doses of this drug (having received 1 cycle of abraxane) to round out her adjuvant chemotherapy.  REVIEW OF SYSTEMS: [place] denies any changes this week, just the usual complaints. She continues on naproxen BID for her knee pain after treatment. Her gums are still inflamed, but are no worse. She denies mouth sores. She is alternating ambien and lorazepam for sleep. She is constipated, but plans to up her dose of stool softeners. She gains weight with every passing week, but has not make any diet or exercise changes. She denies fevers, chills, nausea, or vomiting. She has no peripheral neuropathy symptoms. She is chronically fatigued. Her hair is growing back. A detailed review of systems is otherwise stable.   PAST MEDICAL HISTORY: Past Medical History  Diagnosis Date  . Asthma     last trimester of pregnancy 3 yrs ago-  never had problems since then, no meds  . Breast cancer ([place])   . Anxiety     PAST SURGICAL HISTORY: Past Surgical History  Procedure Laterality Date  . Cesarean section  x 2  . Knee arthroscopy Left 07/27/2013    Procedure: LEFT ARTHROSCOPY KNEE WITH MEDIAL MENISCAL DEBRIDEMENT;  Surgeon: Gearlean Alf, MD;  Location: [facility];  Service: Orthopedics;  Laterality: Left;  Marland Kitchen Mastectomy w/ sentinel node biopsy Left 08/10/2014  . Mastectomy w/  sentinel node biopsy Bilateral 08/10/2014    Procedure: MASTECTOMY WITH SENTINEL LYMPH NODE BIOPSY;  Surgeon: [name], MD;  Location: [facility];  Service: General;  Laterality: Bilateral;  . Portacath placement Right 09/07/2014    Procedure: INSERTION PORT-A-CATH;  Surgeon: [name], MD;  Location: [facility];  Service: General;  Laterality: Right;    FAMILY HISTORY Family History  Problem Relation Age of Onset  . Breast cancer Mother 37  . Lymphoma Maternal Aunt 71  . Uterine cancer Paternal Aunt 13  . Uterine cancer Paternal Grandmother 82  . Ovarian cancer Other 47    mat great aunt through [facility]  . Ovarian cancer Maternal Grandmother 77  The patient's father died with congestive heart failure at the age of 47. The patient's mother is living, age 33. She was diagnosed with breast cancer at age 54. The patient's paternal grandmother was diagnosed with uterine cancer at age 7 and her daughter, the patient's paternal aunt, also with uterine cancer at age 89. On the mother's side one maternal aunt was diagnosed with lymphoma and the other with throat cancer. A maternal great aunt at age 19 was diagnosed with ovarian cancer.   GYNECOLOGIC HISTORY:  No LMP recorded.  menarche age 73, first live birth age 32, which the patient is aware double as the risk of breast cancer. She is GX P2. She still having regular periods. 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 Her husband Lori [name] (goes by "[name]") works in Engineer, technical sales, although currently he is looking for work. Their children are [name] 12 and [name] 3. The patient is not a church attender    ADVANCED DIRECTIVES: Not in place   HEALTH MAINTENANCE: Social History  Substance Use Topics  . Smoking status: Former Smoker -- 1.00 packs/day for 8 years    Types: Cigarettes    Quit date: 07/20/2000  . Smokeless tobacco: Never Used  . Alcohol Use:  Yes     Comment: socially     Colonoscopy:  PAP:  Bone density:  Lipid panel:  No Known Allergies  Current Outpatient Prescriptions  Medication Sig Dispense Refill  . Docusate Sodium (DULCOLAX STOOL SOFTENER PO) Take 3 tablets by mouth daily.     Marland Kitchen LORazepam (ATIVAN) 0.5 MG tablet Take 1 tablet (0.5 mg total) by mouth at bedtime. 30 tablet 0  . naproxen sodium (ANAPROX) 220 MG tablet Take 220 mg by mouth 2 (two) times daily with a meal.    . polyethylene glycol (MIRALAX / GLYCOLAX) packet Take 17 g by mouth daily.    . Probiotic Product (ALIGN PO) Take 1 capsule by mouth daily.    Marland Kitchen venlafaxine XR (EFFEXOR-XR) 75 MG 24 hr capsule Take 1 capsule (75 mg total) by mouth daily with breakfast. 90 capsule 3  . zolpidem (AMBIEN) 5 MG tablet Take 1 tablet (5 mg total) by mouth at bedtime as needed for sleep. 30 tablet 0  . ibuprofen (ADVIL,MOTRIN) 200 MG tablet Take 600 mg by mouth every 6 (six) hours as needed. Reported on 02/21/2015    . prochlorperazine (COMPAZINE) 10 MG tablet  Take 1 tablet (10 mg total) by mouth every 6 (six) hours as needed (Nausea or vomiting). (Patient not taking: Reported on 12/20/2014) 30 tablet 3   No current facility-administered medications for this visit.   Facility-Administered Medications Ordered in Other Visits  Medication Dose Route Frequency Provider Last Rate Last Dose  . sodium chloride 0.9 % injection 10 mL  10 mL Intravenous PRN [name], MD   10 mL at 12/20/14 0908    OBJECTIVE: young White woman in no acute distress Filed Vitals:   02/21/15 0935  BP: 127/70  Pulse: 74  Temp: 97.9 F (36.6 C)  Resp: 18     Body mass index is 35.01 kg/(m^2).      ECOG FS 1  Skin: warm, dry  HEENT: sclerae anicteric, conjunctivae pink, oropharynx clear. No thrush or mucositis.  Lymph Nodes: No cervical or supraclavicular lymphadenopathy  Lungs: clear to auscultation bilaterally, no rales, wheezes, or rhonci  Heart: regular rate and rhythm    Abdomen: round, soft, non tender, positive bowel sounds  Musculoskeletal: No focal spinal tenderness, no peripheral edema  Neuro: non focal, well oriented, positive affect  Breasts: deferred  LAB RESULTS:  INo results found for: SPEP, UPEP  CBC Latest Ref Rng 02/21/2015 02/14/2015 02/07/2015  WBC 3.9 - 10.3 10e3/uL 4.3 5.0 6.0  Hemoglobin 11.6 - 15.9 g/dL 11.6 12.4 12.8  Hematocrit 34.8 - 46.6 % 35.2 36.8 38.3  Platelets 145 - 400 10e3/uL 219 230 284   CMP Latest Ref Rng 02/21/2015 02/14/2015 02/07/2015  Glucose 70 - 140 mg/dl 85 96 90  BUN 7.0 - 26.0 mg/dL 11.5 10.7 9.9  Creatinine 0.6 - 1.1 mg/dL 0.7 0.7 0.8  Sodium 136 - 145 mEq/L 141 139 140  Potassium 3.5 - 5.1 mEq/L 4.3 4.4 4.5  Chloride 101 - 111 mmol/L - - -  CO2 22 - 29 mEq/L 26 25 25   Calcium 8.4 - 10.4 mg/dL 9.2 9.3 9.6  Total Protein 6.4 - 8.3 g/dL 6.5 6.7 7.1  Total Bilirubin 0.20 - 1.20 mg/dL <0.30 <0.30 <0.30  Alkaline Phos 40 - 150 U/L 80 87 94  AST 5 - 34 U/L 21 21 25   ALT 0 - 55 U/L 31 27 29      STUDIES: No results found.  ASSESSMENT: 49 y.o. BRCA negative [place] woman status post left breast biopsy 07/04/2014 for a clinical T3 N0, stage IIA invasive lobular breast cancer, grade 2, estrogen receptor positive, progesterone receptor 1% "positive", with an MIB-1 of less than 5% and no HER-2 amplification  (1) genetics testing 07/29/2014 through the [employer] gene panel offered by [employer] found no deleterious mutations in ATM, BARD1, BRCA1, BRCA2, BRIP1, CDH1, CHEK2, EPCAM, MLH1, MRE11A, MSH2, MSH6, MUTYH, NBN, NF1, PALB2, PMS2, PTEN, RAD50, RAD51C, RAD51D, SMARCA4, STK11, or TP53.   (2) status post bilateral mastectomies 08/10/2014 showing  (a) on the right, lobular carcinoma in situ  (b) on the left, a pT3 pN1, stage IIIA invasive lobular breast cancer, HER-2 repeated and again negative  (3) chemotherapy started 10/11/2014,consisting of doxorubicin and cyclophosphamide in dose dense fashion 4,  completed 11/22/2014, followed by paclitaxel weekly 6 started 12/06/2014. Abraxane given once, and tolerated poorly. To proceed with paclitaxel for the remainder of her treatments.   (4) radiation to follow chemotherapy   (5) tamoxifen was started neoadjuvantly on 07/13/2014 given the likely delay in definitive surgery while genetics results are pending; it is being held during chemotherapy   PLAN: Sharlett continues to manage treatment  as best she can. There are no major changes or new issues. The labs were reviewed in detail and were completely normal. She will proceed with cycle 9 of 11 of paclitaxel as planned today.  She meets with Dr. Isidore Moos tomorrow to discuss radiation therapy.  Dreana will return in 1 week for cycle 10 of treatment. She understands and agrees with this plan. She knows the goal of treatment in her case is cure. She has been encouraged to call with any issues that might arise before her next visit here.  Laurie Panda, NP   02/21/2015 10:07 AM

## 2015-02-22 ENCOUNTER — Encounter: Payer: Self-pay | Admitting: Radiation Oncology

## 2015-02-22 ENCOUNTER — Other Ambulatory Visit: Payer: Self-pay

## 2015-02-22 ENCOUNTER — Ambulatory Visit
Admission: RE | Admit: 2015-02-22 | Discharge: 2015-02-22 | Disposition: A | Discharge status: Home / Self Care | Payer: BLUE CROSS/BLUE SHIELD | Source: Ambulatory Visit | Attending: Radiation Oncology | Admitting: Radiation Oncology

## 2015-02-22 ENCOUNTER — Telehealth: Payer: Self-pay | Admitting: Oncology

## 2015-02-22 VITALS — BP 120/73 | HR 73 | Temp 98.0°F | Wt 234.4 lb

## 2015-02-22 DIAGNOSIS — Z9221 Personal history of antineoplastic chemotherapy: Secondary | ICD-10-CM | POA: Diagnosis not present

## 2015-02-22 DIAGNOSIS — Z170 Estrogen receptor positive status [ER+]: Secondary | ICD-10-CM | POA: Insufficient documentation

## 2015-02-22 DIAGNOSIS — Z510 Encounter for antineoplastic radiation therapy: Secondary | ICD-10-CM | POA: Diagnosis present

## 2015-02-22 DIAGNOSIS — C50412 Malignant neoplasm of upper-outer quadrant of left female breast: Secondary | ICD-10-CM | POA: Insufficient documentation

## 2015-02-22 NOTE — Telephone Encounter (Signed)
1/3 lab and flush added and patient will get at 12/27 appointments as well through [facility]

## 2015-02-22 NOTE — Progress Notes (Signed)
Radiation Oncology         [phone] ________________________________  Name: Lori [name] MRN: [id number]  Date: 02/22/2015  DOB: 04/17/1965  Follow-Up Visit Note  Outpatient  CC: [name], MD  Jovita Kussmaul, MD  Diagnosis:      ICD-9-CM ICD-10-CM   1. Breast cancer of upper-outer quadrant of left female breast ([place]) 174.4 C50.412 Ambulatory referral to Social Work  pT3N1a cM0 Stage III left breast invasive lobular carcinoma with LCIS, Grade 2, ER 50%  PR 1% HER2 neg  Narrative:  The patient returns today for follow-up.  On 08-10-14 she underwent bilateral mastectomy.  Right breast showed LCIS.   Left breast revealed an 8.2 cm tumor with negative margins and characteristics as above in the diagnosis.  2/12 nodes from the left were + and 1 showed isolated tumor cells.  She completes chemotherapy on 03-07-15 (she has received doxorubicin, cyclophosphamide, and now taxol). She reports fatigue, body aches, and mouth sores were an issues with chemotherapy.  She reports she received tamoxifen temporarily prior to chemotherapy.  She reports genetic testing was negative.    Denies lymphedema.  Unsure if she will pursue breast reconstruction.           ALLERGIES:  has No Known Allergies.  Meds: Current Outpatient Prescriptions  Medication Sig Dispense Refill  . B Complex-C (B-COMPLEX WITH VITAMIN C) tablet Take 1 tablet by mouth daily.    Mariane Baumgarten Sodium (DULCOLAX STOOL SOFTENER PO) Take 3 tablets by mouth daily.     Marland Kitchen ibuprofen (ADVIL,MOTRIN) 200 MG tablet Take 600 mg by mouth every 6 (six) hours as needed. Reported on 02/21/2015    . LORazepam (ATIVAN) 0.5 MG tablet Take 1 tablet (0.5 mg total) by mouth at bedtime. 30 tablet 0  . naproxen sodium (ANAPROX) 220 MG tablet Take 220 mg by mouth 2 (two) times daily with a meal.    . Probiotic Product (ALIGN PO) Take 1 capsule by mouth daily.    Marland Kitchen venlafaxine XR (EFFEXOR-XR) 75 MG 24 hr capsule Take 1 capsule (75 mg total) by mouth daily  with breakfast. 90 capsule 3  . zolpidem (AMBIEN) 5 MG tablet Take 1 tablet (5 mg total) by mouth at bedtime as needed for sleep. 30 tablet 0  . polyethylene glycol (MIRALAX / GLYCOLAX) packet Take 17 g by mouth daily. Reported on 02/22/2015    . prochlorperazine (COMPAZINE) 10 MG tablet Take 1 tablet (10 mg total) by mouth every 6 (six) hours as needed (Nausea or vomiting). (Patient not taking: Reported on 12/20/2014) 30 tablet 3   No current facility-administered medications for this encounter.   Facility-Administered Medications Ordered in Other Encounters  Medication Dose Route Frequency Provider Last Rate Last Dose  . sodium chloride 0.9 % injection 10 mL  10 mL Intravenous PRN [name], MD   10 mL at 12/20/14 0908    Physical Findings:  weight is 234 lb 6.4 oz (106.323 kg). Her temperature is 98 F (36.7 C). Her blood pressure is 120/73 and her pulse is 73. Marland Kitchen     General: Alert and oriented, in no acute distress HEENT: Head is normocephalic. Extraocular movements are intact. Oropharynx is clear. Neck: Neck is supple, no palpable cervical or supraclavicular lymphadenopathy. Heart: Regular in rate and rhythm with no murmurs, rubs, or gallops. Chest: Clear to auscultation bilaterally, with no rhonchi, wheezes, or rales. Abdomen: Soft, nontender, nondistended, with no rigidity or guarding. Extremities: No cyanosis or edema. Lymphatics: see Neck Exam  Musculoskeletal: symmetric strength and muscle tone throughout. Neurologic: No obvious focalities. Speech is fluent.  Psychiatric: Judgment and insight are intact. Affect is appropriate. Breast exam reveals - bilateral mastectomies; no sign of recurrence over chest walls nor axillae bilaterally  Lab Findings: Lab Results  Component Value Date   WBC 4.3 02/21/2015   HGB 11.6 02/21/2015   HCT 35.2 02/21/2015   MCV 89.4 02/21/2015   PLT 219 02/21/2015      Radiographic Findings: No results found.  Impression/Plan: We  discussed adjuvant radiotherapy today.  I recommend radiotherapy to the left chest wall and regional nodes in order to reduce risk of recurrence by 2/3 and improve chance of overall survival .  The risks, benefits and side effects of this treatment were discussed in detail.  She understands that radiotherapy is associated with skin irritation and fatigue in the acute setting. Late effects can include lymphedema, reconstruction difficulties, cosmetic changes and rare injury to internal organs.   She is enthusiastic about proceeding with treatment. A consent form has been   signed and placed in her chart. Will simulate on 03-22-15.  I spent 30 minutes minutes face to face with the patient and more than 50% of that time was spent in counseling and/or coordination of care. _____________________________________   [name], MD

## 2015-02-28 ENCOUNTER — Other Ambulatory Visit (HOSPITAL_BASED_OUTPATIENT_CLINIC_OR_DEPARTMENT_OTHER): Payer: BLUE CROSS/BLUE SHIELD

## 2015-02-28 ENCOUNTER — Ambulatory Visit (HOSPITAL_BASED_OUTPATIENT_CLINIC_OR_DEPARTMENT_OTHER): Payer: BLUE CROSS/BLUE SHIELD

## 2015-02-28 ENCOUNTER — Encounter: Payer: Self-pay | Admitting: *Deleted

## 2015-02-28 ENCOUNTER — Ambulatory Visit: Payer: BLUE CROSS/BLUE SHIELD

## 2015-02-28 ENCOUNTER — Encounter: Payer: Self-pay | Admitting: Nurse Practitioner

## 2015-02-28 ENCOUNTER — Ambulatory Visit (HOSPITAL_BASED_OUTPATIENT_CLINIC_OR_DEPARTMENT_OTHER): Payer: BLUE CROSS/BLUE SHIELD | Admitting: Nurse Practitioner

## 2015-02-28 VITALS — BP 105/69 | HR 93 | Temp 97.9°F | Resp 18 | Ht 68.0 in | Wt 227.1 lb

## 2015-02-28 DIAGNOSIS — C50412 Malignant neoplasm of upper-outer quadrant of left female breast: Secondary | ICD-10-CM | POA: Diagnosis not present

## 2015-02-28 DIAGNOSIS — Z170 Estrogen receptor positive status [ER+]: Secondary | ICD-10-CM | POA: Diagnosis not present

## 2015-02-28 DIAGNOSIS — Z5111 Encounter for antineoplastic chemotherapy: Secondary | ICD-10-CM | POA: Diagnosis not present

## 2015-02-28 DIAGNOSIS — Z95828 Presence of other vascular implants and grafts: Secondary | ICD-10-CM

## 2015-02-28 DIAGNOSIS — D0501 Lobular carcinoma in situ of right breast: Secondary | ICD-10-CM

## 2015-02-28 LAB — CBC WITH DIFFERENTIAL/PLATELET
BASO%: 2.2 % — AB (ref 0.0–2.0)
BASOS ABS: 0.1 10*3/uL (ref 0.0–0.1)
EOS%: 4.4 % (ref 0.0–7.0)
Eosinophils Absolute: 0.2 10*3/uL (ref 0.0–0.5)
HCT: 36 % (ref 34.8–46.6)
HGB: 12.1 g/dL (ref 11.6–15.9)
LYMPH%: 23.8 % (ref 14.0–49.7)
MCH: 29.8 pg (ref 25.1–34.0)
MCHC: 33.5 g/dL (ref 31.5–36.0)
MCV: 88.9 fL (ref 79.5–101.0)
MONO#: 0.4 10*3/uL (ref 0.1–0.9)
MONO%: 9.6 % (ref 0.0–14.0)
NEUT#: 2.7 10*3/uL (ref 1.5–6.5)
NEUT%: 60 % (ref 38.4–76.8)
Platelets: 272 10*3/uL (ref 145–400)
RBC: 4.05 10*6/uL (ref 3.70–5.45)
RDW: 14.4 % (ref 11.2–14.5)
WBC: 4.4 10*3/uL (ref 3.9–10.3)
lymph#: 1.1 10*3/uL (ref 0.9–3.3)

## 2015-02-28 LAB — COMPREHENSIVE METABOLIC PANEL
ALBUMIN: 3.6 g/dL (ref 3.5–5.0)
ALK PHOS: 98 U/L (ref 40–150)
ALT: 44 U/L (ref 0–55)
ANION GAP: 7 meq/L (ref 3–11)
AST: 31 U/L (ref 5–34)
BUN: 7.2 mg/dL (ref 7.0–26.0)
CO2: 26 meq/L (ref 22–29)
Calcium: 9.3 mg/dL (ref 8.4–10.4)
Chloride: 107 mEq/L (ref 98–109)
Creatinine: 0.7 mg/dL (ref 0.6–1.1)
GLUCOSE: 100 mg/dL (ref 70–140)
POTASSIUM: 4.4 meq/L (ref 3.5–5.1)
SODIUM: 140 meq/L (ref 136–145)
Total Bilirubin: 0.33 mg/dL (ref 0.20–1.20)
Total Protein: 6.7 g/dL (ref 6.4–8.3)

## 2015-02-28 MED ORDER — SODIUM CHLORIDE 0.9 % IV SOLN
Freq: Once | INTRAVENOUS | Status: AC
  Administered 2015-02-28: 11:00:00 via INTRAVENOUS

## 2015-02-28 MED ORDER — PACLITAXEL CHEMO INJECTION 300 MG/50ML
80.0000 mg/m2 | Freq: Once | INTRAVENOUS | Status: AC
  Administered 2015-02-28: 180 mg via INTRAVENOUS
  Filled 2015-02-28: qty 30

## 2015-02-28 MED ORDER — SODIUM CHLORIDE 0.9 % IV SOLN
Freq: Once | INTRAVENOUS | Status: AC
  Administered 2015-02-28: 12:00:00 via INTRAVENOUS
  Filled 2015-02-28: qty 4

## 2015-02-28 MED ORDER — SODIUM CHLORIDE 0.9 % IJ SOLN
10.0000 mL | INTRAMUSCULAR | Status: DC | PRN
  Administered 2015-02-28: 10 mL
  Filled 2015-02-28: qty 10

## 2015-02-28 MED ORDER — HEPARIN SOD (PORK) LOCK FLUSH 100 UNIT/ML IV SOLN
500.0000 [IU] | Freq: Once | INTRAVENOUS | Status: AC | PRN
  Administered 2015-02-28: 500 [IU]
  Filled 2015-02-28: qty 5

## 2015-02-28 MED ORDER — FAMOTIDINE IN NACL 20-0.9 MG/50ML-% IV SOLN
INTRAVENOUS | Status: AC
  Filled 2015-02-28: qty 50

## 2015-02-28 MED ORDER — FAMOTIDINE IN NACL 20-0.9 MG/50ML-% IV SOLN
20.0000 mg | Freq: Once | INTRAVENOUS | Status: AC
Start: 2015-02-28 — End: 2015-02-28
  Administered 2015-02-28: 20 mg via INTRAVENOUS

## 2015-02-28 MED ORDER — SODIUM CHLORIDE 0.9 % IJ SOLN
10.0000 mL | INTRAMUSCULAR | Status: DC | PRN
  Administered 2015-02-28: 10 mL via INTRAVENOUS
  Filled 2015-02-28: qty 10

## 2015-02-28 MED ORDER — DIPHENHYDRAMINE HCL 50 MG/ML IJ SOLN
25.0000 mg | Freq: Once | INTRAMUSCULAR | Status: AC
  Administered 2015-02-28: 25 mg via INTRAVENOUS

## 2015-02-28 MED ORDER — DIPHENHYDRAMINE HCL 50 MG/ML IJ SOLN
INTRAMUSCULAR | Status: AC
  Filled 2015-02-28: qty 1

## 2015-02-28 NOTE — Patient Instructions (Signed)

## 2015-02-28 NOTE — Patient Instructions (Signed)
[facility] Discharge Instructions for Patients Receiving Chemotherapy  Today you received the following chemotherapy agents Taxol  To help prevent nausea and vomiting after your treatment, we encourage you to take your nausea medication   If you develop nausea and vomiting that is not controlled by your nausea medication, call the clinic.   BELOW ARE SYMPTOMS THAT SHOULD BE REPORTED IMMEDIATELY:  *FEVER GREATER THAN 100.5 F  *CHILLS WITH OR WITHOUT FEVER  NAUSEA AND VOMITING THAT IS NOT CONTROLLED WITH YOUR NAUSEA MEDICATION  *UNUSUAL SHORTNESS OF BREATH  *UNUSUAL BRUISING OR BLEEDING  TENDERNESS IN MOUTH AND THROAT WITH OR WITHOUT PRESENCE OF ULCERS  *URINARY PROBLEMS  *BOWEL PROBLEMS  UNUSUAL RASH Items with * indicate a potential emergency and should be followed up as soon as possible.  Feel free to call the clinic you have any questions or concerns. The clinic phone number is [phone].  Please show the CHEMO ALERT CARD at check-in to the Emergency Department and triage nurse.   

## 2015-02-28 NOTE — Progress Notes (Signed)
[place] Psychosocial Distress Screening Clinical Social Work  Clinical Social Work was referred by distress screening protocol.  The patient scored a 6 on the Psychosocial Distress Thermometer which indicates moderate distress. Clinical Social Worker reviewed chart and phoned pt to assess for distress and other psychosocial needs. CSW left message introducing CSW/pt and Family Support Team, explaining role of CSW and how team could assist. CSW encouraged pt to return call.   [facility] DISTRESS SCREENING 02/22/2015  Screening Type Initial Screening  Distress experienced in past week (1-10) 6  Practical problem type   Emotional problem type Nervousness/Anxiety;Adjusting to illness;Adjusting to appearance changes  Spiritual/Religous concerns type Facing my mortality  Physical Problem type Sleep/insomnia;Mouth sores/swallowing  Physician notified of physical symptoms Yes  Referral to clinical psychology No  Referral to clinical social work Yes  Referral to dietition No  Referral to financial advocate No  Referral to support programs No  Referral to palliative care No  Other     Clinical Social Worker follow up needed: Yes.    If yes, follow up plan:  CSW awaits return call. Loren Racer, [place] Worker [place]  [place] Phone: [phone] Fax: [phone]

## 2015-02-28 NOTE — Progress Notes (Signed)
[name]  [phone] [phone]) [phone]   ID: EARLY ORD DOB: February 01, 1966  MR#: [id number]  JKD#:[id number]  Patient Care Team: Delilah Shan, MD as PCP - General (Family Medicine) [name], MD as Consulting Physician (General Surgery) [name], MD as Consulting Physician (Oncology) [name], MD as Attending Physician (Radiation Oncology) [name], RN as Registered Nurse [name], RN as Registered Nurse [name], NP as Nurse Practitioner (Nurse Practitioner) PCP: [name], MD GYN: [name] MD OTHER MD:  CHIEF COMPLAINT: Estrogen receptor positive breast cancer  CURRENT TREATMENT: adjuvant chemotherapy  BREAST CANCER HISTORY: From the original intake note:  Lori [name] (who is the daughter of my former patient, Lori [name], herself now 10 years out from her T1 cN0 invasive ductal carcinoma, treated with anti-estrogens only) [facility] April 20 03/23/2014. Breast density was category C. A possible mass in the left breast upper outer quadrant was felt to warrant further evaluation, and on 07/04/2014 the patient underwent left mammography with tomosynthesis and left breast ultrasonography. At this point the patient felt she was able to palpate a mass in the area in question. Tomosynthesis did reveal an irregular mass in the upper outer left breast measuring 2.3 cm, associated on physical exam with a large area of thickening. Ultrasound of the area in question confirmed an irregular mass at the 1:00 position 5 cm from the nipple measuring 2.2 cm. The left axilla was negative sonographically.  Biopsy of the mass in question the same day, 07/04/2014, showed (SAA [id number]) an invasive lobular breast cancer which was estrogen receptor 50% positive with strong staining intensity, progesterone receptor 1% positive with moderate staining intensity, with a proliferation  marker of 1%, and no HER-2 amplification, the signals ratio being 1.19 and the number per cell 1.55.  On 07/08/2014 the patient underwent bilateral breast MRI. This showed no abnormal adenopathy and no abnormality in the right breast. In the left breast however there was a 9.3 area of abnormal enhancement involving all quadrants. Within the upper outer quadrant there was a denser area which measured up to 5.1 cm.  The patient's subsequent history is as detailed below  INTERVAL HISTORY: Lori [name] returns today for follow-up of her stage II breast cancer, alone. She is due for cycle 11 of 12 of taxane therapy, consisting of paclitaxel for the majority of the infusions and abraxane given just once on week 7.   REVIEW OF SYSTEMS: Again, Lori [name] denies any changes. She is controlling her knee pain with naproxen BID and has no gastric upset from this. Her gums are still inflamed, but no worse. She denies fevers, chills, nausea, or vomiting. She is moving her bowels well with stool softeners. She is chronically fatigued and occasionally irritable. She is alternating ambien and lorazepam for sleep. She has no neuropathy symptoms. A detailed review of systems is otherwise stable.   PAST MEDICAL HISTORY: Past Medical History  Diagnosis Date  . Asthma     last trimester of pregnancy 47 yrs ago-  never had problems since then, no meds  . Breast cancer ([place])   . Anxiety     PAST SURGICAL HISTORY: Past Surgical History  Procedure Laterality Date  . Cesarean section      x 2  . Knee arthroscopy Left 07/27/2013    Procedure: LEFT ARTHROSCOPY KNEE WITH MEDIAL MENISCAL DEBRIDEMENT;  Surgeon: Gearlean Alf, MD;  Location[facility];  Service:  Orthopedics;  Laterality: Left;  Marland Kitchen Mastectomy w/ sentinel node biopsy Left 08/10/2014  . Mastectomy w/ sentinel node biopsy Bilateral 08/10/2014    Procedure: MASTECTOMY WITH SENTINEL LYMPH NODE BIOPSY;  Surgeon: [name], MD;  Location: [facility];  Service: General;   Laterality: Bilateral;  . Portacath placement Right 09/07/2014    Procedure: INSERTION PORT-A-CATH;  Surgeon: [name], MD;  Location: [facility];  Service: General;  Laterality: Right;    FAMILY HISTORY Family History  Problem Relation Age of Onset  . Breast cancer Mother 58  . Lymphoma Maternal Aunt 71  . Uterine cancer Paternal Aunt 42  . Uterine cancer Paternal Grandmother 79  . Ovarian cancer Other 71    mat great aunt through [facility]  . Ovarian cancer Maternal Grandmother 26  The patient's father died with congestive heart failure at the age of 60. The patient's mother is living, age 79. She was diagnosed with breast cancer at age 26. The patient's paternal grandmother was diagnosed with uterine cancer at age 15 and her daughter, the patient's paternal aunt, also with uterine cancer at age 85. On the mother's side one maternal aunt was diagnosed with lymphoma and the other with throat cancer. A maternal great aunt at age 13 was diagnosed with ovarian cancer.   GYNECOLOGIC HISTORY:  No LMP recorded.  menarche age 49, first live birth age 49, which the patient is aware double as the risk of breast cancer. She is GX P2. She still having regular periods. 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 Her husband AMALEA OTTEY the third (goes by "[name]") works in Engineer, technical sales, although currently he is looking for work. Their children are [name] 12 and [name] 3. The patient is not a church attender    ADVANCED DIRECTIVES: Not in place   HEALTH MAINTENANCE: Social History  Substance Use Topics  . Smoking status: Former Smoker -- 1.00 packs/day for 8 years    Types: Cigarettes    Quit date: 07/20/2000  . Smokeless tobacco: Never Used  . Alcohol Use: Yes     Comment: socially     Colonoscopy:  PAP:  Bone density:  Lipid panel:  No Known Allergies  Current Outpatient Prescriptions  Medication Sig Dispense  Refill  . B Complex-C (B-COMPLEX WITH VITAMIN C) tablet Take 1 tablet by mouth daily.    Mariane Baumgarten Sodium (DULCOLAX STOOL SOFTENER PO) Take 3 tablets by mouth daily.     Marland Kitchen ibuprofen (ADVIL,MOTRIN) 200 MG tablet Take 600 mg by mouth every 6 (six) hours as needed. Reported on 02/21/2015    . LORazepam (ATIVAN) 0.5 MG tablet Take 1 tablet (0.5 mg total) by mouth at bedtime. 30 tablet 0  . naproxen sodium (ANAPROX) 220 MG tablet Take 220 mg by mouth 2 (two) times daily with a meal.    . polyethylene glycol (MIRALAX / GLYCOLAX) packet Take 17 g by mouth daily. Reported on 02/22/2015    . Probiotic Product (ALIGN PO) Take 1 capsule by mouth daily.    . prochlorperazine (COMPAZINE) 10 MG tablet Take 1 tablet (10 mg total) by mouth every 6 (six) hours as needed (Nausea or vomiting). (Patient not taking: Reported on 12/20/2014) 30 tablet 3  . venlafaxine XR (EFFEXOR-XR) 75 MG 24 hr capsule Take 1 capsule (75 mg total) by mouth daily with breakfast. 90 capsule 3  . zolpidem (AMBIEN) 5 MG tablet Take 1  tablet (5 mg total) by mouth at bedtime as needed for sleep. 30 tablet 0   No current facility-administered medications for this visit.   Facility-Administered Medications Ordered in Other Visits  Medication Dose Route Frequency Provider Last Rate Last Dose  . heparin lock flush 100 unit/mL  500 Units Intracatheter Once PRN [name], MD      . ondansetron [facility]) 8 mg in sodium chloride 0.9 % 50 mL IVPB   Intravenous Once [name], MD      . PACLitaxel (TAXOL) 180 mg in dextrose 5 % 250 mL chemo infusion (</= 8m/m2)  80 mg/m2 (Treatment Plan Actual) Intravenous Once GChauncey Cruel MD      . sodium chloride 0.9 % injection 10 mL  10 mL Intravenous PRN GChauncey Cruel MD   10 mL at 12/20/14 0908  . sodium chloride 0.9 % injection 10 mL  10 mL Intracatheter PRN GChauncey Cruel MD        OBJECTIVE: young White woman in no acute distress Filed Vitals:   02/28/15 1040  BP:  105/69  Pulse: 93  Temp: 97.9 F (36.6 C)  Resp: 18     Body mass index is 34.54 kg/(m^2).      ECOG FS 1  Sclerae unicteric, pupils round and equal Oropharynx clear and moist-- no thrush or other lesions No cervical or supraclavicular adenopathy Lungs no rales or rhonchi Heart regular rate and rhythm Abd soft, nontender, positive bowel sounds MSK no focal spinal tenderness, no upper extremity lymphedema Neuro: nonfocal, well oriented, appropriate affect Breasts: deferred  LAB RESULTS:  INo results found for: SPEP, UPEP  CBC Latest Ref Rng 02/28/2015 02/21/2015 02/14/2015  WBC 3.9 - 10.3 10e3/uL 4.4 4.3 5.0  Hemoglobin 11.6 - 15.9 g/dL 12.1 11.6 12.4  Hematocrit 34.8 - 46.6 % 36.0 35.2 36.8  Platelets 145 - 400 10e3/uL 272 219 230   CMP Latest Ref Rng 02/28/2015 02/21/2015 02/14/2015  Glucose 70 - 140 mg/dl 100 85 96  BUN 7.0 - 26.0 mg/dL 7.2 11.5 10.7  Creatinine 0.6 - 1.1 mg/dL 0.7 0.7 0.7  Sodium 136 - 145 mEq/L 140 141 139  Potassium 3.5 - 5.1 mEq/L 4.4 4.3 4.4  Chloride 101 - 111 mmol/L - - -  CO2 22 - 29 mEq/L 26 26 25   Calcium 8.4 - 10.4 mg/dL 9.3 9.2 9.3  Total Protein 6.4 - 8.3 g/dL 6.7 6.5 6.7  Total Bilirubin 0.20 - 1.20 mg/dL 0.33 <0.30 <0.30  Alkaline Phos 40 - 150 U/L 98 80 87  AST 5 - 34 U/L 31 21 21   ALT 0 - 55 U/L 44 31 27     STUDIES: No results found.  ASSESSMENT: 49y.o. BRCA negative [place] woman status post left breast biopsy 07/04/2014 for a clinical T3 N0, stage IIA invasive lobular breast cancer, grade 2, estrogen receptor positive, progesterone receptor 1% "positive", with an MIB-1 of less than 5% and no HER-2 amplification  (1) genetics testing 07/29/2014 through the [employer]vaNext gene panel offered by [facility] [facility] no deleterious mutations in ATM, BARD1, BRCA1, BRCA2, BRIP1, CDH1, CHEK2, EPCAM, MLH1, MRE11A, MSH2, MSH6, MUTYH, NBN, NF1, PALB2, PMS2, PTEN, RAD50, RAD51C, RAD51D, SMARCA4, STK11, or TP53.   (2) status post bilateral  mastectomies 08/10/2014 showing  (a) on the right, lobular carcinoma in situ  (b) on the left, a pT3 pN1, stage IIIA invasive lobular breast cancer, HER-2 repeated and again negative  (3) chemotherapy started 10/11/2014,consisting of doxorubicin and  cyclophosphamide in dose dense fashion 4, completed 11/22/2014, followed by paclitaxel weekly 6 started 12/06/2014. Abraxane given once, and tolerated poorly. To proceed with paclitaxel for the remainder of her treatments.   (4) radiation to follow chemotherapy   (5) tamoxifen was started neoadjuvantly on 07/13/2014 given the likely delay in definitive surgery while genetics results are pending; it is being held during chemotherapy   PLAN: Nirali had a good holiday, with no issues over the weekend. The labs were reviewed in detail and were entirely normal. She will proceed with week 11 of 12 of taxane therapy.   Sherrice will return in 1 week for her 12th and final dose of treatment. She understands and agrees with this plan. She knows the goal of treatment in her case is cure. She has been encouraged to call with any issues that might arise before her next visit here.  Laurie Panda, NP   02/28/2015 11:38 AM

## 2015-03-07 ENCOUNTER — Ambulatory Visit (HOSPITAL_BASED_OUTPATIENT_CLINIC_OR_DEPARTMENT_OTHER): Payer: BLUE CROSS/BLUE SHIELD | Admitting: Nurse Practitioner

## 2015-03-07 ENCOUNTER — Ambulatory Visit (HOSPITAL_BASED_OUTPATIENT_CLINIC_OR_DEPARTMENT_OTHER): Payer: BLUE CROSS/BLUE SHIELD

## 2015-03-07 ENCOUNTER — Encounter: Payer: Self-pay | Admitting: *Deleted

## 2015-03-07 ENCOUNTER — Other Ambulatory Visit (HOSPITAL_BASED_OUTPATIENT_CLINIC_OR_DEPARTMENT_OTHER): Payer: BLUE CROSS/BLUE SHIELD

## 2015-03-07 ENCOUNTER — Other Ambulatory Visit: Payer: Self-pay | Admitting: *Deleted

## 2015-03-07 ENCOUNTER — Encounter: Payer: Self-pay | Admitting: Nurse Practitioner

## 2015-03-07 ENCOUNTER — Other Ambulatory Visit: Payer: Self-pay | Admitting: Nurse Practitioner

## 2015-03-07 ENCOUNTER — Ambulatory Visit: Payer: BLUE CROSS/BLUE SHIELD

## 2015-03-07 VITALS — BP 117/66 | HR 97 | Temp 98.3°F | Resp 19 | Wt 223.9 lb

## 2015-03-07 DIAGNOSIS — D0501 Lobular carcinoma in situ of right breast: Secondary | ICD-10-CM | POA: Diagnosis not present

## 2015-03-07 DIAGNOSIS — Z95828 Presence of other vascular implants and grafts: Secondary | ICD-10-CM

## 2015-03-07 DIAGNOSIS — C50412 Malignant neoplasm of upper-outer quadrant of left female breast: Secondary | ICD-10-CM

## 2015-03-07 DIAGNOSIS — Z5111 Encounter for antineoplastic chemotherapy: Secondary | ICD-10-CM

## 2015-03-07 DIAGNOSIS — Z170 Estrogen receptor positive status [ER+]: Secondary | ICD-10-CM | POA: Diagnosis not present

## 2015-03-07 LAB — COMPREHENSIVE METABOLIC PANEL
ALBUMIN: 4 g/dL (ref 3.5–5.0)
ALK PHOS: 89 U/L (ref 40–150)
ALT: 26 U/L (ref 0–55)
AST: 22 U/L (ref 5–34)
Anion Gap: 9 mEq/L (ref 3–11)
BUN: 14 mg/dL (ref 7.0–26.0)
CALCIUM: 9.5 mg/dL (ref 8.4–10.4)
CO2: 24 mEq/L (ref 22–29)
Chloride: 105 mEq/L (ref 98–109)
Creatinine: 0.7 mg/dL (ref 0.6–1.1)
Glucose: 103 mg/dl (ref 70–140)
POTASSIUM: 4.3 meq/L (ref 3.5–5.1)
Sodium: 138 mEq/L (ref 136–145)
Total Bilirubin: 0.34 mg/dL (ref 0.20–1.20)
Total Protein: 7.2 g/dL (ref 6.4–8.3)

## 2015-03-07 LAB — CBC WITH DIFFERENTIAL/PLATELET
BASO%: 1.3 % (ref 0.0–2.0)
BASOS ABS: 0.1 10*3/uL (ref 0.0–0.1)
EOS ABS: 0.1 10*3/uL (ref 0.0–0.5)
EOS%: 2.7 % (ref 0.0–7.0)
HEMATOCRIT: 38.8 % (ref 34.8–46.6)
HEMOGLOBIN: 13 g/dL (ref 11.6–15.9)
LYMPH#: 1.4 10*3/uL (ref 0.9–3.3)
LYMPH%: 27.1 % (ref 14.0–49.7)
MCH: 29.4 pg (ref 25.1–34.0)
MCHC: 33.5 g/dL (ref 31.5–36.0)
MCV: 87.8 fL (ref 79.5–101.0)
MONO#: 0.4 10*3/uL (ref 0.1–0.9)
MONO%: 6.9 % (ref 0.0–14.0)
NEUT#: 3.3 10*3/uL (ref 1.5–6.5)
NEUT%: 62 % (ref 38.4–76.8)
PLATELETS: 276 10*3/uL (ref 145–400)
RBC: 4.42 10*6/uL (ref 3.70–5.45)
RDW: 14.4 % (ref 11.2–14.5)
WBC: 5.3 10*3/uL (ref 3.9–10.3)

## 2015-03-07 MED ORDER — LORAZEPAM 0.5 MG PO TABS: 0.5000 mg | ORAL_TABLET | Freq: Every day | ORAL | Status: DC

## 2015-03-07 MED ORDER — SODIUM CHLORIDE 0.9 % IJ SOLN
10.0000 mL | INTRAMUSCULAR | Status: DC | PRN
  Administered 2015-03-07: 10 mL
  Filled 2015-03-07: qty 10

## 2015-03-07 MED ORDER — SODIUM CHLORIDE 0.9 % IV SOLN
Freq: Once | INTRAVENOUS | Status: AC
  Administered 2015-03-07: 14:00:00 via INTRAVENOUS
  Filled 2015-03-07: qty 4

## 2015-03-07 MED ORDER — DIPHENHYDRAMINE HCL 50 MG/ML IJ SOLN
INTRAMUSCULAR | Status: AC
  Filled 2015-03-07: qty 1

## 2015-03-07 MED ORDER — FAMOTIDINE IN NACL 20-0.9 MG/50ML-% IV SOLN
INTRAVENOUS | Status: AC
  Filled 2015-03-07: qty 50

## 2015-03-07 MED ORDER — SODIUM CHLORIDE 0.9 % IJ SOLN
10.0000 mL | INTRAMUSCULAR | Status: DC | PRN
  Administered 2015-03-07: 10 mL via INTRAVENOUS
  Filled 2015-03-07: qty 10

## 2015-03-07 MED ORDER — FAMOTIDINE IN NACL 20-0.9 MG/50ML-% IV SOLN
20.0000 mg | Freq: Once | INTRAVENOUS | Status: AC
  Administered 2015-03-07: 20 mg via INTRAVENOUS

## 2015-03-07 MED ORDER — HEPARIN SOD (PORK) LOCK FLUSH 100 UNIT/ML IV SOLN
500.0000 [IU] | Freq: Once | INTRAVENOUS | Status: AC | PRN
  Administered 2015-03-07: 500 [IU]
  Filled 2015-03-07: qty 5

## 2015-03-07 MED ORDER — DEXTROSE 5 % IV SOLN
80.0000 mg/m2 | Freq: Once | INTRAVENOUS | Status: AC
  Administered 2015-03-07: 180 mg via INTRAVENOUS
  Filled 2015-03-07: qty 30

## 2015-03-07 MED ORDER — DIPHENHYDRAMINE HCL 50 MG/ML IJ SOLN
25.0000 mg | Freq: Once | INTRAMUSCULAR | Status: AC
  Administered 2015-03-07: 25 mg via INTRAVENOUS

## 2015-03-07 MED ORDER — SODIUM CHLORIDE 0.9 % IV SOLN
Freq: Once | INTRAVENOUS | Status: AC
  Administered 2015-03-07: 14:00:00 via INTRAVENOUS

## 2015-03-07 NOTE — Patient Instructions (Signed)

## 2015-03-07 NOTE — Progress Notes (Signed)
[place]  [phone]) [phone] [phone]) [phone]   ID: MACARIA [name] DOB: 1966-01-06  MR#: [id number]  LNL#:[id number]  Patient Care Team: Lori Shan, MD as PCP - General (Family Medicine) Lori Messing III, MD as Consulting Physician (General Surgery) Lori Cruel, MD as Consulting Physician (Oncology) Lori Gibson, MD as Attending Physician (Radiation Oncology) Lori Germany, RN as Registered Nurse Lori Kaufmann, RN as Registered Nurse Lori Bouche, NP as Nurse Practitioner (Nurse Practitioner) PCP: Lori Simmer, MD GYN: Lori Ache MD OTHER MD:  CHIEF COMPLAINT: Estrogen receptor positive breast cancer  CURRENT TREATMENT: adjuvant chemotherapy  BREAST CANCER HISTORY: From the original intake note:  Lori [name] (who is the daughter of my former patient, Lori [name], herself now 10 years out from her T1 cN0 invasive ductal carcinoma, treated with anti-estrogens only) [facility] April 20 03/23/2014. Breast density was category C. A possible mass in the left breast upper outer quadrant was felt to warrant further evaluation, and on 07/04/2014 the patient underwent left mammography with tomosynthesis and left breast ultrasonography. At this point the patient felt she was able to palpate a mass in the area in question. Tomosynthesis did reveal an irregular mass in the upper outer left breast measuring 2.3 cm, associated on physical exam with a large area of thickening. Ultrasound of the area in question confirmed an irregular mass at the 1:00 position 5 cm from the nipple measuring 2.2 cm. The left axilla was negative sonographically.  Biopsy of the mass in question the same day, 07/04/2014, showed (SAA [id number]) an invasive lobular breast cancer which was estrogen receptor 50% positive with strong staining intensity, progesterone receptor 1% positive with moderate staining intensity, with a proliferation  marker of 1%, and no HER-2 amplification, the signals ratio being 1.19 and the number per cell 1.55.  On 07/08/2014 the patient underwent bilateral breast MRI. This showed no abnormal adenopathy and no abnormality in the right breast. In the left breast however there was a 9.3 area of abnormal enhancement involving all quadrants. Within the upper outer quadrant there was a denser area which measured up to 5.1 cm.  The patient's subsequent history is as detailed below  INTERVAL HISTORY: Lori [name] returns today for follow-up of her stage II breast cancer, alone. She is due for cycle 12 of 12 of taxane therapy, consisting of paclitaxel for the majority of the infusions and abraxane given just once on week 7.   REVIEW OF SYSTEMS: Lori [name] is in good spirits today. She is optimistic about her outlook, now that she has chemotherapy out of the way. She has no new complaints today. She is controlling her knee pain with naproxen BID and has no gastric upset from this. Her gums are still inflamed, but no worse. She denies fevers, chills, nausea, or vomiting. She is moving her bowels well with stool softeners. She is chronically fatigued and occasionally irritable. She is alternating ambien and lorazepam for sleep. She has no neuropathy symptoms. A detailed review of systems is otherwise stable.  PAST MEDICAL HISTORY: Past Medical History  Diagnosis Date  . Asthma     last trimester of pregnancy 3 yrs ago-  never had problems since then, no meds  . Breast cancer ([place])   . Anxiety     PAST SURGICAL HISTORY: Past Surgical History  Procedure Laterality Date  . Cesarean section      x 2  . Knee arthroscopy Left 07/27/2013  Procedure: LEFT ARTHROSCOPY KNEE WITH MEDIAL MENISCAL DEBRIDEMENT;  Surgeon: Gearlean Alf, MD;  Location[facility];  Service: Orthopedics;  Laterality: Left;  Marland Kitchen Mastectomy w/ sentinel node biopsy Left 08/10/2014  . Mastectomy w/ sentinel node biopsy Bilateral 08/10/2014    Procedure:  MASTECTOMY WITH SENTINEL LYMPH NODE BIOPSY;  Surgeon: Lori Messing III, MD;  Location: [facility];  Service: General;  Laterality: Bilateral;  . Portacath placement Right 09/07/2014    Procedure: INSERTION PORT-A-CATH;  Surgeon: Lori Messing III, MD;  Location: [facility];  Service: General;  Laterality: Right;    FAMILY HISTORY Family History  Problem Relation Age of Onset  . Breast cancer Mother 75  . Lymphoma Maternal Aunt 71  . Uterine cancer Paternal Aunt 25  . Uterine cancer Paternal Grandmother 32  . Ovarian cancer Other 53    mat great aunt through [facility]  . Ovarian cancer Maternal Grandmother 21  The patient's father died with congestive heart failure at the age of 4. The patient's mother is living, age 51. She was diagnosed with breast cancer at age 110. The patient's paternal grandmother was diagnosed with uterine cancer at age 64 and her daughter, the patient's paternal aunt, also with uterine cancer at age 44. On the mother's side one maternal aunt was diagnosed with lymphoma and the other with throat cancer. A maternal great aunt at age 55 was diagnosed with ovarian cancer.   GYNECOLOGIC HISTORY:  No LMP recorded.  menarche age 75, first live birth age 26, which the patient is aware double as the risk of breast cancer. She is GX P2. She still having regular periods. 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 Her husband Lori [name] (goes by "Lori [name]") works in Engineer, technical sales, although currently he is looking for work. Their children are Lori [name] 12 and Lori [name] 3. The patient is not a church attender    ADVANCED DIRECTIVES: Not in place   HEALTH MAINTENANCE: Social History  Substance Use Topics  . Smoking status: Former Smoker -- 1.00 packs/day for 8 years    Types: Cigarettes    Quit date: 07/20/2000  . Smokeless tobacco: Never Used  . Alcohol Use: Yes     Comment: socially      Colonoscopy:  PAP:  Bone density:  Lipid panel:  No Known Allergies  Current Outpatient Prescriptions  Medication Sig Dispense Refill  . B Complex-C (B-COMPLEX WITH VITAMIN C) tablet Take 1 tablet by mouth daily.    Mariane Baumgarten Sodium (DULCOLAX STOOL SOFTENER PO) Take 3 tablets by mouth daily.     . naproxen sodium (ANAPROX) 220 MG tablet Take 220 mg by mouth 2 (two) times daily with a meal.    . polyethylene glycol (MIRALAX / GLYCOLAX) packet Take 17 g by mouth daily. Reported on 02/22/2015    . Probiotic Product (ALIGN PO) Take 1 capsule by mouth daily.    Marland Kitchen venlafaxine XR (EFFEXOR-XR) 75 MG 24 hr capsule Take 1 capsule (75 mg total) by mouth daily with breakfast. 90 capsule 3  . zolpidem (AMBIEN) 5 MG tablet Take 1 tablet (5 mg total) by mouth at bedtime as needed for sleep. 30 tablet 0  . ibuprofen (ADVIL,MOTRIN) 200 MG tablet Take 600 mg by mouth every 6 (six) hours as needed. Reported on 03/07/2015    . LORazepam (ATIVAN) 0.5 MG tablet Take 1 tablet (0.5 mg total) by mouth at bedtime. [place]  tablet 0  . prochlorperazine (COMPAZINE) 10 MG tablet Take 1 tablet (10 mg total) by mouth every 6 (six) hours as needed (Nausea or vomiting). (Patient not taking: Reported on 12/20/2014) 30 tablet 3   No current facility-administered medications for this visit.   Facility-Administered Medications Ordered in Other Visits  Medication Dose Route Frequency Provider Last Rate Last Dose  . sodium chloride 0.9 % injection 10 mL  10 mL Intravenous PRN Lori Cruel, MD   10 mL at 12/20/14 0908    OBJECTIVE: young White woman in no acute distress Filed Vitals:   03/07/15 1108  BP: 117/66  Pulse: 97  Temp: 98.3 F (36.8 C)  Resp: 19     Body mass index is 34.05 kg/(m^2).      ECOG FS 1  Skin: warm, dry  HEENT: sclerae anicteric, conjunctivae pink, oropharynx clear. No thrush or mucositis.  Lymph Nodes: No cervical or supraclavicular lymphadenopathy  Lungs: clear to auscultation  bilaterally, no rales, wheezes, or rhonci  Heart: regular rate and rhythm  Abdomen: round, soft, non tender, positive bowel sounds  Musculoskeletal: No focal spinal tenderness, no peripheral edema  Neuro: non focal, well oriented, positive affect  Breasts: deferred  LAB RESULTS:  INo results found for: SPEP, UPEP  CBC Latest Ref Rng 03/07/2015 02/28/2015 02/21/2015  WBC 3.9 - 10.3 10e3/uL 5.3 4.4 4.3  Hemoglobin 11.6 - 15.9 g/dL 13.0 12.1 11.6  Hematocrit 34.8 - 46.6 % 38.8 36.0 35.2  Platelets 145 - 400 10e3/uL 276 272 219   CMP Latest Ref Rng 03/07/2015 02/28/2015 02/21/2015  Glucose 70 - 140 mg/dl 103 100 85  BUN 7.0 - 26.0 mg/dL 14.0 7.2 11.5  Creatinine 0.6 - 1.1 mg/dL 0.7 0.7 0.7  Sodium 136 - 145 mEq/L 138 140 141  Potassium 3.5 - 5.1 mEq/L 4.3 4.4 4.3  Chloride 101 - 111 mmol/L - - -  CO2 22 - 29 mEq/L 24 26 26   Calcium 8.4 - 10.4 mg/dL 9.5 9.3 9.2  Total Protein 6.4 - 8.3 g/dL 7.2 6.7 6.5  Total Bilirubin 0.20 - 1.20 mg/dL 0.34 0.33 <0.30  Alkaline Phos 40 - 150 U/L 89 98 80  AST 5 - 34 U/L 22 31 21   ALT 0 - 55 U/L 26 44 31     STUDIES: No results found.  ASSESSMENT: 50 y.o. BRCA negative [place] woman status post left breast biopsy 07/04/2014 for a clinical T3 N0, stage IIA invasive lobular breast cancer, grade 2, estrogen receptor positive, progesterone receptor 1% "positive", with an MIB-1 of less than 5% and no HER-2 amplification  (1) genetics testing 07/29/2014 through the [employer]vaNext gene panel offered by [employer] found no deleterious mutations in ATM, BARD1, BRCA1, BRCA2, BRIP1, CDH1, CHEK2, EPCAM, MLH1, MRE11A, MSH2, MSH6, MUTYH, NBN, NF1, PALB2, PMS2, PTEN, RAD50, RAD51C, RAD51D, SMARCA4, STK11, or TP53.   (2) status post bilateral mastectomies 08/10/2014 showing  (a) on the right, lobular carcinoma in situ  (b) on the left, a pT3 pN1, stage IIIA invasive lobular breast cancer, HER-2 repeated and again negative  (3) chemotherapy started  10/11/2014,consisting of doxorubicin and cyclophosphamide in dose dense fashion 4, completed 11/22/2014, followed by paclitaxel weekly 6 started 12/06/2014. Abraxane given once, and tolerated poorly. To proceed with paclitaxel for the remainder of her treatments.   (4) radiation to follow chemotherapy   (5) tamoxifen was started neoadjuvantly on 05/11/2016it is being held during chemotherapy   PLAN: Kineta is excited to be completing chemotherapy today. The labs  were reviewed in detail and were entirely normal. She will proceed with her 12th and final cycle of paclitaxel as planned.   Although her counts have been consistent, I advised her to wait at least 2 weeks before having any dental work. She will contact this office for a release note. She understands that chemotherapy has likely changed her vision, and she shouldwait 4-6 weeks before going to the ophthalmologist.   She is scheduled for a radiation simulation 2 weeks from now and will begin therapy daily shortly after that. She will return in February for follow up with Dr. Jana Hakim, and they will discuss resuming tamoxifen at the completion of radiation. She understands and agrees with this plan. She knows the goal of treatment in her case is cure. She has been encouraged to call with any issues that might arise before her next visit here.  Laurie Panda, NP   03/07/2015 11:55 AM

## 2015-03-07 NOTE — Patient Instructions (Signed)
[facility] Discharge Instructions for Patients Receiving Chemotherapy  Today you received the following chemotherapy agents:  Taxol  To help prevent nausea and vomiting after your treatment, we encourage you to take your nausea medication as ordered per MD.   If you develop nausea and vomiting that is not controlled by your nausea medication, call the clinic.   BELOW ARE SYMPTOMS THAT SHOULD BE REPORTED IMMEDIATELY:  *FEVER GREATER THAN 100.5 F  *CHILLS WITH OR WITHOUT FEVER  NAUSEA AND VOMITING THAT IS NOT CONTROLLED WITH YOUR NAUSEA MEDICATION  *UNUSUAL SHORTNESS OF BREATH  *UNUSUAL BRUISING OR BLEEDING  TENDERNESS IN MOUTH AND THROAT WITH OR WITHOUT PRESENCE OF ULCERS  *URINARY PROBLEMS  *BOWEL PROBLEMS  UNUSUAL RASH Items with * indicate a potential emergency and should be followed up as soon as possible.  Feel free to call the clinic you have any questions or concerns. The clinic phone number is [phone].  Please show the CHEMO ALERT CARD at check-in to the Emergency Department and triage nurse.   

## 2015-03-08 ENCOUNTER — Telehealth: Payer: Self-pay | Admitting: Oncology

## 2015-03-08 NOTE — Telephone Encounter (Signed)
Called patient and she is aware of her added flushn

## 2015-03-22 ENCOUNTER — Ambulatory Visit: Payer: BLUE CROSS/BLUE SHIELD | Admitting: Radiation Oncology

## 2015-03-22 ENCOUNTER — Ambulatory Visit
Admission: RE | Admit: 2015-03-22 | Discharge: 2015-03-22 | Disposition: A | Discharge status: Home / Self Care | Payer: BLUE CROSS/BLUE SHIELD | Source: Ambulatory Visit | Attending: Radiation Oncology | Admitting: Radiation Oncology

## 2015-03-22 DIAGNOSIS — Z510 Encounter for antineoplastic radiation therapy: Secondary | ICD-10-CM | POA: Diagnosis not present

## 2015-03-22 DIAGNOSIS — C50412 Malignant neoplasm of upper-outer quadrant of left female breast: Secondary | ICD-10-CM

## 2015-03-22 NOTE — Progress Notes (Signed)
Radiation Oncology         [phone] ________________________________  Name: Lori [name] MRN: [id number]  Date: 03/22/2015  DOB: 1965/05/15  SIMULATION AND TREATMENT PLANNING NOTE and spec tx procedure note    Outpatient  DIAGNOSIS:     ICD-9-CM ICD-10-CM   1. Breast cancer of upper-outer quadrant of left female breast ([facility]) 174.4 C50.412     NARRATIVE:  The patient was brought to the [facility].  Identity was confirmed.  All relevant records and images related to the planned course of therapy were reviewed.  The patient freely provided informed written consent to proceed with treatment after reviewing the details related to the planned course of therapy. The consent form was witnessed and verified by the simulation staff.    Then, the patient was set-up in a stable reproducible supine position for radiation therapy with her ipsilateral arm over her head, and her upper body secured in a custom-made Vac-lok device.  CT images were obtained.  Surface markings were placed.  The CT images were loaded into the planning software.    Special treatment procedure: Special tx procedure was performed today due to the extra time and effort required by myself to plan and prepare this patient for deep inspiration breath hold technique.  I have determined cardiac sparing to be of benefit to this patient to prevent long term cardiac damage due to radiation of the heart.  Bellows were placed on the patient's abdomen. To facilitate cardiac sparing, the patient was coached by the radiation therapists on breath hold techniques and breathing practice was performed. Practice waveforms were obtained. The patient was then scanned while maintaining breath hold in the treatment position.  This image was then transferred over to the imaging specialist. The imaging specialist then created a fusion of the free breathing and breath hold scans using the chest wall as the stable structure. I personally  reviewed the fusion in axial, coronal and sagittal image planes.  Excellent cardiac sparing was obtained.  I felt the patient is an appropriate candidate for breath hold and the patient will be treated as such.  The image fusion was then reviewed with the patient to reinforce the necessity of reproducible breath hold.  TREATMENT PLANNING NOTE: Treatment planning then occurred.  The radiation prescription was entered and confirmed.     A total of 5 medically necessary complex treatment devices were fabricated and supervised by me: 4 fields with MLCs for custom blocks to protect heart, and lungs;  and, a Vac-lok. MORE COMPLEX DEVICES MAY BE MADE IN DOSIMETRY FOR FIELD IN FIELD BEAMS FOR DOSE HOMOGENEITY.  I have requested : 3D Simulation  I have requested a DVH of the following structures: lungs, heart, esophagus, cord.    The patient will receive 50 Gy in 25 fractions to the left chest wall with 2 tangential fields.  45 Gy in 25 fractions to left SCV and axilla with 2 more fields. This will be followed by a boost to the scar of the chest wall.  Optical Surface Tracking Plan:  Since intensity modulated radiotherapy (IMRT) and 3D conformal radiation treatment methods are predicated on accurate and precise positioning for treatment, intrafraction motion monitoring is medically necessary to ensure accurate and safe treatment delivery. The ability to quantify intrafraction motion without excessive ionizing radiation dose can only be performed with optical surface tracking. Accordingly, surface imaging offers the opportunity to obtain 3D measurements of patient position throughout IMRT and 3D treatments without excessive radiation  exposure. I am ordering optical surface tracking for this patient's upcoming course of radiotherapy.  ________________________________   Reference:  Ursula Alert, J, et al. Surface imaging-based analysis of intrafraction motion for breast radiotherapy  patients.Journal of [place], n. 6, nov. 2014. ISSN [id number].  Available at: <http://www.jacmp.org/index.php/jacmp/article/view/4957>.    -----------------------------------  [name], MD

## 2015-03-27 DIAGNOSIS — Z510 Encounter for antineoplastic radiation therapy: Secondary | ICD-10-CM | POA: Diagnosis not present

## 2015-03-29 ENCOUNTER — Ambulatory Visit
Admission: RE | Admit: 2015-03-29 | Discharge: 2015-03-29 | Disposition: A | Discharge status: Home / Self Care | Payer: BLUE CROSS/BLUE SHIELD | Source: Ambulatory Visit | Attending: Radiation Oncology | Admitting: Radiation Oncology

## 2015-03-29 DIAGNOSIS — Z510 Encounter for antineoplastic radiation therapy: Secondary | ICD-10-CM | POA: Diagnosis not present

## 2015-03-30 ENCOUNTER — Ambulatory Visit
Admission: RE | Admit: 2015-03-30 | Discharge: 2015-03-30 | Disposition: A | Discharge status: Home / Self Care | Payer: BLUE CROSS/BLUE SHIELD | Source: Ambulatory Visit | Attending: Radiation Oncology | Admitting: Radiation Oncology

## 2015-03-30 DIAGNOSIS — Z510 Encounter for antineoplastic radiation therapy: Secondary | ICD-10-CM | POA: Diagnosis not present

## 2015-03-31 ENCOUNTER — Ambulatory Visit
Admission: RE | Admit: 2015-03-31 | Discharge: 2015-03-31 | Disposition: A | Discharge status: Home / Self Care | Payer: BLUE CROSS/BLUE SHIELD | Source: Ambulatory Visit | Attending: Radiation Oncology | Admitting: Radiation Oncology

## 2015-03-31 DIAGNOSIS — Z510 Encounter for antineoplastic radiation therapy: Secondary | ICD-10-CM | POA: Diagnosis not present

## 2015-04-03 ENCOUNTER — Encounter: Payer: Self-pay | Admitting: Radiation Oncology

## 2015-04-03 ENCOUNTER — Ambulatory Visit
Admission: RE | Admit: 2015-04-03 | Discharge: 2015-04-03 | Disposition: A | Discharge status: Home / Self Care | Payer: BLUE CROSS/BLUE SHIELD | Source: Ambulatory Visit | Attending: Radiation Oncology | Admitting: Radiation Oncology

## 2015-04-03 VITALS — BP 103/70 | HR 84 | Temp 98.0°F | Ht 68.0 in | Wt 218.3 lb

## 2015-04-03 DIAGNOSIS — C50412 Malignant neoplasm of upper-outer quadrant of left female breast: Secondary | ICD-10-CM

## 2015-04-03 DIAGNOSIS — Z510 Encounter for antineoplastic radiation therapy: Secondary | ICD-10-CM | POA: Diagnosis not present

## 2015-04-03 MED ORDER — RADIAPLEXRX EX GEL
Freq: Once | CUTANEOUS | Status: AC
  Administered 2015-04-03: 10:00:00 via TOPICAL

## 2015-04-03 MED ORDER — ALRA NON-METALLIC DEODORANT (RAD-ONC)
1.0000 "application " | Freq: Once | TOPICAL | Status: AC
  Administered 2015-04-03: 1 via TOPICAL

## 2015-04-03 MED ORDER — RADIAPLEXRX EX GEL: Freq: Once | CUTANEOUS | Status: DC

## 2015-04-03 MED ORDER — ALRA NON-METALLIC DEODORANT (RAD-ONC): 1.0000 "application " | Freq: Once | TOPICAL | Status: DC

## 2015-04-03 NOTE — Progress Notes (Signed)
Pt here for patient teaching.  Pt given Radiation and You booklet, skin care instructions, Alra deodorant and Radiaplex gel. Pt reports they have not watched the Radiation Therapy Education video, but were given the link today to watch at home.  Reviewed areas of pertinence such as fatigue, hair loss, skin changes, breast tenderness, breast swelling, cough, shortness of breath, earaches and taste changes . Pt able to give teach back of to pat skin, use unscented/gentle soap and drink plenty of water,apply Radiaplex bid, avoid applying anything to skin within 4 hours of treatment, avoid wearing an under wire bra and to use an electric razor if they must shave. Pt verbalizes understanding of information given and will contact nursing with any questions or concerns.     Http://rtanswers.org/treatmentinformation/whattoexpect/index

## 2015-04-03 NOTE — Addendum Note (Signed)
Encounter addended by: Ernst Spell, RN on: 04/03/2015 10:06 AM<BR>     Documentation filed: Dx Association, Inpatient MAR, Orders

## 2015-04-03 NOTE — Progress Notes (Signed)
Lori [name] is here for her 3rd fraction of radiation to her Left Chest wall. She is feelling fatigued, but relates it mostly to her chemotherapy. She needs to take rest breaks at time. She does complain of some soreness in her Left Axilla after her simulation last week. She has no skin issues over her Left chest at this time. Education was provided to the patient today about her radiation treatment.   BP 103/70 mmHg  Pulse 84  Temp(Src) 98 F (36.7 C)  Ht 5\' 8"  (1.727 m)  Wt 218 lb 4.8 oz (99.02 kg)  BMI 33.20 kg/m2

## 2015-04-03 NOTE — Progress Notes (Signed)
   Weekly Management Note:  Outpatient    ICD-9-CM ICD-10-CM   1. Breast cancer of upper-outer quadrant of left female breast ([facility]) 174.4 C50.412     Current Dose:  6 Gy  Projected Dose: 60 Gy   Narrative:  The patient presents for routine under treatment assessment.  CBCT/MVCT images/Port film x-rays were reviewed.  The chart was checked. Doing well.  No  complaints except mild axillary soreness on left  Physical Findings:  Wt Readings from Last 3 Encounters:  04/03/15 218 lb 4.8 oz (99.02 kg)  03/07/15 223 lb 14.4 oz (101.56 kg)  02/28/15 227 lb 1.6 oz (103.012 kg)    height is 5\' 8"  (1.727 m) and weight is 218 lb 4.8 oz (99.02 kg). Her temperature is 98 F (36.7 C). Her blood pressure is 103/70 and her pulse is 84.  NAD, no skin irritation in RT fields   CBC    Component Value Date/Time   WBC 5.3 03/07/2015 1046   WBC 7.0 08/03/2014 1022   RBC 4.42 03/07/2015 1046   RBC 4.85 08/03/2014 1022   HGB 13.0 03/07/2015 1046   HGB 14.1 08/03/2014 1022   HCT 38.8 03/07/2015 1046   HCT 42.4 08/03/2014 1022   PLT 276 03/07/2015 1046   PLT 250 08/03/2014 1022   MCV 87.8 03/07/2015 1046   MCV 87.4 08/03/2014 1022   MCH 29.4 03/07/2015 1046   MCH 29.1 08/03/2014 1022   MCHC 33.5 03/07/2015 1046   MCHC 33.3 08/03/2014 1022   RDW 14.4 03/07/2015 1046   RDW 12.6 08/03/2014 1022   LYMPHSABS 1.4 03/07/2015 1046   MONOABS 0.4 03/07/2015 1046   EOSABS 0.1 03/07/2015 1046   BASOSABS 0.1 03/07/2015 1046     CMP     Component Value Date/Time   NA 138 03/07/2015 1046   NA 139 08/03/2014 1022   K 4.3 03/07/2015 1046   K 4.1 08/03/2014 1022   CL 106 08/03/2014 1022   CO2 24 03/07/2015 1046   CO2 25 08/03/2014 1022   GLUCOSE 103 03/07/2015 1046   GLUCOSE 89 08/03/2014 1022   BUN 14.0 03/07/2015 1046   BUN 10 08/03/2014 1022   CREATININE 0.7 03/07/2015 1046   CREATININE 0.68 08/03/2014 1022   CALCIUM 9.5 03/07/2015 1046   CALCIUM 9.3 08/03/2014 1022   PROT 7.2 03/07/2015 1046    ALBUMIN 4.0 03/07/2015 1046   AST 22 03/07/2015 1046   ALT 26 03/07/2015 1046   ALKPHOS 89 03/07/2015 1046   BILITOT 0.34 03/07/2015 1046   GFRNONAA >60 08/03/2014 1022   GFRAA >60 08/03/2014 1022     Impression:  The patient is tolerating radiotherapy.   Plan:  Continue radiotherapy as planned.   -----------------------------------  [name], MD

## 2015-04-03 NOTE — Addendum Note (Signed)
Encounter addended by: Ernst Spell, RN on: 04/03/2015  9:54 AM<BR>     Documentation filed: Visit Diagnoses, Dx Association, Medications, Orders

## 2015-04-04 ENCOUNTER — Ambulatory Visit
Admission: RE | Admit: 2015-04-04 | Discharge: 2015-04-04 | Disposition: A | Discharge status: Home / Self Care | Payer: BLUE CROSS/BLUE SHIELD | Source: Ambulatory Visit | Attending: Radiation Oncology | Admitting: Radiation Oncology

## 2015-04-04 DIAGNOSIS — Z510 Encounter for antineoplastic radiation therapy: Secondary | ICD-10-CM | POA: Diagnosis not present

## 2015-04-05 ENCOUNTER — Ambulatory Visit
Admission: RE | Admit: 2015-04-05 | Discharge: 2015-04-05 | Disposition: A | Discharge status: Home / Self Care | Payer: BLUE CROSS/BLUE SHIELD | Source: Ambulatory Visit | Attending: Radiation Oncology | Admitting: Radiation Oncology

## 2015-04-05 DIAGNOSIS — Z510 Encounter for antineoplastic radiation therapy: Secondary | ICD-10-CM | POA: Diagnosis not present

## 2015-04-06 ENCOUNTER — Ambulatory Visit
Admission: RE | Admit: 2015-04-06 | Discharge: 2015-04-06 | Disposition: A | Discharge status: Home / Self Care | Payer: BLUE CROSS/BLUE SHIELD | Source: Ambulatory Visit | Attending: Radiation Oncology | Admitting: Radiation Oncology

## 2015-04-06 DIAGNOSIS — Z510 Encounter for antineoplastic radiation therapy: Secondary | ICD-10-CM | POA: Diagnosis not present

## 2015-04-07 ENCOUNTER — Telehealth: Payer: Self-pay | Admitting: Genetic Counselor

## 2015-04-07 ENCOUNTER — Telehealth: Payer: Self-pay | Admitting: *Deleted

## 2015-04-07 ENCOUNTER — Ambulatory Visit
Admission: RE | Admit: 2015-04-07 | Discharge: 2015-04-07 | Disposition: A | Discharge status: Home / Self Care | Payer: BLUE CROSS/BLUE SHIELD | Source: Ambulatory Visit | Attending: Radiation Oncology | Admitting: Radiation Oncology

## 2015-04-07 DIAGNOSIS — Z510 Encounter for antineoplastic radiation therapy: Secondary | ICD-10-CM | POA: Diagnosis not present

## 2015-04-07 NOTE — Telephone Encounter (Signed)
I spoke to Mr. Geschke today about the issues they have had between [place], [employer]ph[employer] Shelling and themselves.  They have received checks from [facility] that should be turned over to [employer].  I emailed Kathlee Nations and Arvilla Market, the reps for [employer] and they will look into this.  Additionally, I spoke with Amy in the billing office of [place] [phone]) who was willing to help work this out.  I spoke with Vira Browns, who stated that her husband spoke with [place] today as well.  They will send him something that states that hte [employer] will not owe more than $100 OOP, and then he will pay the amount that [facility] sent them.  If there is any other issues they will call on Monday.

## 2015-04-07 NOTE — Telephone Encounter (Signed)
Called pt to assess needs during xrt. Related doing well and without complaints at this time. Confirmed f/u with Dr. Jana Hakim on 2/15. Encourage pt to call with questions or needs. Received verbal understanding.

## 2015-04-10 ENCOUNTER — Encounter: Payer: Self-pay | Admitting: Radiation Oncology

## 2015-04-10 ENCOUNTER — Ambulatory Visit
Admission: RE | Admit: 2015-04-10 | Discharge: 2015-04-10 | Disposition: A | Discharge status: Home / Self Care | Payer: BLUE CROSS/BLUE SHIELD | Source: Ambulatory Visit | Attending: Radiation Oncology | Admitting: Radiation Oncology

## 2015-04-10 VITALS — BP 103/65 | HR 70 | Temp 98.3°F | Resp 16 | Ht 68.0 in | Wt 218.7 lb

## 2015-04-10 DIAGNOSIS — Z510 Encounter for antineoplastic radiation therapy: Secondary | ICD-10-CM | POA: Diagnosis not present

## 2015-04-10 DIAGNOSIS — C50412 Malignant neoplasm of upper-outer quadrant of left female breast: Secondary | ICD-10-CM

## 2015-04-10 NOTE — Progress Notes (Signed)
   Weekly Management Note:  Outpatient    ICD-9-CM ICD-10-CM   1. Breast cancer of upper-outer quadrant of left female breast ([facility]) 174.4 C50.412     Current Dose:  16 Gy  Projected Dose: 60 Gy   Narrative:  The patient presents for routine under treatment assessment.  CBCT/MVCT images/Port film x-rays were reviewed.  The chart was checked. Doing well.   No complaints.  Physical Findings:  Wt Readings from Last 3 Encounters:  04/10/15 218 lb 11.2 oz (99.202 kg)  04/03/15 218 lb 4.8 oz (99.02 kg)  03/07/15 223 lb 14.4 oz (101.56 kg)    height is 5\' 8"  (1.727 m) and weight is 218 lb 11.2 oz (99.202 kg). Her oral temperature is 98.3 F (36.8 C). Her blood pressure is 103/65 and her pulse is 70. Her respiration is 16.  NAD, no obvious skin irritation in RT fields   CBC    Component Value Date/Time   WBC 5.3 03/07/2015 1046   WBC 7.0 08/03/2014 1022   RBC 4.42 03/07/2015 1046   RBC 4.85 08/03/2014 1022   HGB 13.0 03/07/2015 1046   HGB 14.1 08/03/2014 1022   HCT 38.8 03/07/2015 1046   HCT 42.4 08/03/2014 1022   PLT 276 03/07/2015 1046   PLT 250 08/03/2014 1022   MCV 87.8 03/07/2015 1046   MCV 87.4 08/03/2014 1022   MCH 29.4 03/07/2015 1046   MCH 29.1 08/03/2014 1022   MCHC 33.5 03/07/2015 1046   MCHC 33.3 08/03/2014 1022   RDW 14.4 03/07/2015 1046   RDW 12.6 08/03/2014 1022   LYMPHSABS 1.4 03/07/2015 1046   MONOABS 0.4 03/07/2015 1046   EOSABS 0.1 03/07/2015 1046   BASOSABS 0.1 03/07/2015 1046     CMP     Component Value Date/Time   NA 138 03/07/2015 1046   NA 139 08/03/2014 1022   K 4.3 03/07/2015 1046   K 4.1 08/03/2014 1022   CL 106 08/03/2014 1022   CO2 24 03/07/2015 1046   CO2 25 08/03/2014 1022   GLUCOSE 103 03/07/2015 1046   GLUCOSE 89 08/03/2014 1022   BUN 14.0 03/07/2015 1046   BUN 10 08/03/2014 1022   CREATININE 0.7 03/07/2015 1046   CREATININE 0.68 08/03/2014 1022   CALCIUM 9.5 03/07/2015 1046   CALCIUM 9.3 08/03/2014 1022   PROT 7.2 03/07/2015  1046   ALBUMIN 4.0 03/07/2015 1046   AST 22 03/07/2015 1046   ALT 26 03/07/2015 1046   ALKPHOS 89 03/07/2015 1046   BILITOT 0.34 03/07/2015 1046   GFRNONAA >60 08/03/2014 1022   GFRAA >60 08/03/2014 1022     Impression:  The patient is tolerating radiotherapy.   Plan:  Continue radiotherapy as planned.   -----------------------------------  [name], MD

## 2015-04-10 NOTE — Progress Notes (Signed)
Lori [name] has completed 8 fractions to her left chest wall.  She denies pain and reports her energy is getting better.  The skin on her left chest wall does not have any irration.  She is using radiaplex gel.  BP 103/65 mmHg  Pulse 70  Temp(Src) 98.3 F (36.8 C) (Oral)  Resp 16  Ht 5\' 8"  (1.727 m)  Wt 218 lb 11.2 oz (99.202 kg)  BMI 33.26 kg/m2

## 2015-04-11 ENCOUNTER — Ambulatory Visit
Admission: RE | Admit: 2015-04-11 | Discharge: 2015-04-11 | Disposition: A | Discharge status: Home / Self Care | Payer: BLUE CROSS/BLUE SHIELD | Source: Ambulatory Visit | Attending: Radiation Oncology | Admitting: Radiation Oncology

## 2015-04-11 DIAGNOSIS — Z510 Encounter for antineoplastic radiation therapy: Secondary | ICD-10-CM | POA: Diagnosis not present

## 2015-04-12 ENCOUNTER — Ambulatory Visit
Admission: RE | Admit: 2015-04-12 | Discharge: 2015-04-12 | Disposition: A | Discharge status: Home / Self Care | Payer: BLUE CROSS/BLUE SHIELD | Source: Ambulatory Visit | Attending: Radiation Oncology | Admitting: Radiation Oncology

## 2015-04-12 DIAGNOSIS — Z510 Encounter for antineoplastic radiation therapy: Secondary | ICD-10-CM | POA: Diagnosis not present

## 2015-04-13 ENCOUNTER — Ambulatory Visit
Admission: RE | Admit: 2015-04-13 | Discharge: 2015-04-13 | Disposition: A | Discharge status: Home / Self Care | Payer: BLUE CROSS/BLUE SHIELD | Source: Ambulatory Visit | Attending: Radiation Oncology | Admitting: Radiation Oncology

## 2015-04-13 DIAGNOSIS — Z510 Encounter for antineoplastic radiation therapy: Secondary | ICD-10-CM | POA: Diagnosis not present

## 2015-04-14 ENCOUNTER — Ambulatory Visit
Admission: RE | Admit: 2015-04-14 | Discharge: 2015-04-14 | Disposition: A | Discharge status: Home / Self Care | Payer: BLUE CROSS/BLUE SHIELD | Source: Ambulatory Visit | Attending: Radiation Oncology | Admitting: Radiation Oncology

## 2015-04-14 DIAGNOSIS — Z510 Encounter for antineoplastic radiation therapy: Secondary | ICD-10-CM | POA: Diagnosis not present

## 2015-04-17 ENCOUNTER — Ambulatory Visit
Admission: RE | Admit: 2015-04-17 | Discharge: 2015-04-17 | Disposition: A | Discharge status: Home / Self Care | Payer: BLUE CROSS/BLUE SHIELD | Source: Ambulatory Visit | Attending: Radiation Oncology | Admitting: Radiation Oncology

## 2015-04-17 ENCOUNTER — Encounter: Payer: Self-pay | Admitting: Radiation Oncology

## 2015-04-17 VITALS — BP 119/69 | HR 74 | Temp 97.9°F | Ht 68.0 in | Wt 220.8 lb

## 2015-04-17 DIAGNOSIS — Z510 Encounter for antineoplastic radiation therapy: Secondary | ICD-10-CM | POA: Diagnosis not present

## 2015-04-17 DIAGNOSIS — C50412 Malignant neoplasm of upper-outer quadrant of left female breast: Secondary | ICD-10-CM | POA: Diagnosis not present

## 2015-04-17 MED ORDER — RADIAPLEXRX EX GEL
Freq: Once | CUTANEOUS | Status: AC
  Administered 2015-04-17: 14:00:00 via TOPICAL

## 2015-04-17 NOTE — Progress Notes (Signed)
   Weekly Management Note:  Outpatient    ICD-9-CM ICD-10-CM   1. Breast cancer of upper-outer quadrant of left female breast ([facility]) 174.4 C50.412 hyaluronate sodium (RADIAPLEXRX) gel    Current Dose: 26 Gy  Projected Dose: 60 Gy   Narrative:  The patient presents for routine under treatment assessment.  CBCT/MVCT images/Port film x-rays were reviewed.  The chart was checked. Doing well - some fatigue  Physical Findings:  height is 5\' 8"  (1.727 m) and weight is 220 lb 12.8 oz (100.154 kg). Her temperature is 97.9 F (36.6 C). Her blood pressure is 119/69 and her pulse is 74.   Wt Readings from Last 3 Encounters:  04/17/15 220 lb 12.8 oz (100.154 kg)  04/10/15 218 lb 11.2 oz (99.202 kg)  04/03/15 218 lb 4.8 oz (99.02 kg)   Mild erythema in treatment fields over skin  Impression:  The patient is tolerating radiotherapy.  Plan:  Continue radiotherapy as planned.    ________________________________   [name], M.D.

## 2015-04-17 NOTE — Progress Notes (Signed)
Lori [name] has received 13 fractions to her left CW.  Note erythema in the upper, inner portion of her field. She reports fatigue and taking naps as needed.  Given Radiaplex Gel.

## 2015-04-18 ENCOUNTER — Ambulatory Visit
Admission: RE | Admit: 2015-04-18 | Discharge: 2015-04-18 | Disposition: A | Discharge status: Home / Self Care | Payer: BLUE CROSS/BLUE SHIELD | Source: Ambulatory Visit | Attending: Radiation Oncology | Admitting: Radiation Oncology

## 2015-04-18 DIAGNOSIS — Z510 Encounter for antineoplastic radiation therapy: Secondary | ICD-10-CM | POA: Diagnosis not present

## 2015-04-19 ENCOUNTER — Ambulatory Visit
Admission: RE | Admit: 2015-04-19 | Discharge: 2015-04-19 | Disposition: A | Discharge status: Home / Self Care | Payer: BLUE CROSS/BLUE SHIELD | Source: Ambulatory Visit | Attending: Radiation Oncology | Admitting: Radiation Oncology

## 2015-04-19 ENCOUNTER — Ambulatory Visit (HOSPITAL_BASED_OUTPATIENT_CLINIC_OR_DEPARTMENT_OTHER): Payer: BLUE CROSS/BLUE SHIELD | Admitting: Oncology

## 2015-04-19 ENCOUNTER — Other Ambulatory Visit: Payer: BLUE CROSS/BLUE SHIELD

## 2015-04-19 ENCOUNTER — Encounter: Payer: Self-pay | Admitting: *Deleted

## 2015-04-19 ENCOUNTER — Other Ambulatory Visit (HOSPITAL_BASED_OUTPATIENT_CLINIC_OR_DEPARTMENT_OTHER): Payer: BLUE CROSS/BLUE SHIELD

## 2015-04-19 ENCOUNTER — Telehealth: Payer: Self-pay | Admitting: Oncology

## 2015-04-19 ENCOUNTER — Ambulatory Visit (HOSPITAL_BASED_OUTPATIENT_CLINIC_OR_DEPARTMENT_OTHER): Payer: BLUE CROSS/BLUE SHIELD

## 2015-04-19 VITALS — BP 110/63 | HR 77 | Temp 98.3°F | Resp 18 | Ht 68.0 in | Wt 220.9 lb

## 2015-04-19 DIAGNOSIS — K869 Disease of pancreas, unspecified: Secondary | ICD-10-CM

## 2015-04-19 DIAGNOSIS — Z170 Estrogen receptor positive status [ER+]: Secondary | ICD-10-CM

## 2015-04-19 DIAGNOSIS — Z452 Encounter for adjustment and management of vascular access device: Secondary | ICD-10-CM

## 2015-04-19 DIAGNOSIS — Z510 Encounter for antineoplastic radiation therapy: Secondary | ICD-10-CM | POA: Diagnosis not present

## 2015-04-19 DIAGNOSIS — C50412 Malignant neoplasm of upper-outer quadrant of left female breast: Secondary | ICD-10-CM

## 2015-04-19 DIAGNOSIS — D0501 Lobular carcinoma in situ of right breast: Secondary | ICD-10-CM

## 2015-04-19 DIAGNOSIS — Z95828 Presence of other vascular implants and grafts: Secondary | ICD-10-CM

## 2015-04-19 LAB — COMPREHENSIVE METABOLIC PANEL
ALBUMIN: 3.9 g/dL (ref 3.5–5.0)
ALT: 25 U/L (ref 0–55)
AST: 22 U/L (ref 5–34)
Alkaline Phosphatase: 91 U/L (ref 40–150)
Anion Gap: 9 mEq/L (ref 3–11)
BUN: 12.3 mg/dL (ref 7.0–26.0)
CALCIUM: 9.6 mg/dL (ref 8.4–10.4)
CHLORIDE: 106 meq/L (ref 98–109)
CO2: 24 mEq/L (ref 22–29)
CREATININE: 0.7 mg/dL (ref 0.6–1.1)
EGFR: >90 mL/min/{1.73_m2} (ref >90)
Glucose: 93 mg/dl (ref 70–140)
Potassium: 4.3 mEq/L (ref 3.5–5.1)
Sodium: 139 mEq/L (ref 136–145)
Total Bilirubin: 0.39 mg/dL (ref 0.20–1.20)
Total Protein: 7.3 g/dL (ref 6.4–8.3)

## 2015-04-19 LAB — CBC WITH DIFFERENTIAL/PLATELET
BASO%: 1.1 % (ref 0.0–2.0)
Basophils Absolute: 0.1 10*3/uL (ref 0.0–0.1)
EOS%: 4.3 % (ref 0.0–7.0)
Eosinophils Absolute: 0.2 10*3/uL (ref 0.0–0.5)
HEMATOCRIT: 42.3 % (ref 34.8–46.6)
HEMOGLOBIN: 14.1 g/dL (ref 11.6–15.9)
LYMPH#: 1 10*3/uL (ref 0.9–3.3)
LYMPH%: 18.1 % (ref 14.0–49.7)
MCH: 28.8 pg (ref 25.1–34.0)
MCHC: 33.4 g/dL (ref 31.5–36.0)
MCV: 86.1 fL (ref 79.5–101.0)
MONO#: 0.5 10*3/uL (ref 0.1–0.9)
MONO%: 8.6 % (ref 0.0–14.0)
NEUT%: 67.9 % (ref 38.4–76.8)
NEUTROS ABS: 3.8 10*3/uL (ref 1.5–6.5)
Platelets: 211 10*3/uL (ref 145–400)
RBC: 4.92 10*6/uL (ref 3.70–5.45)
RDW: 14.1 % (ref 11.2–14.5)
WBC: 5.6 10*3/uL (ref 3.9–10.3)

## 2015-04-19 MED ORDER — HEPARIN SOD (PORK) LOCK FLUSH 100 UNIT/ML IV SOLN
500.0000 [IU] | Freq: Once | INTRAVENOUS | Status: AC
  Administered 2015-04-19: 500 [IU] via INTRAVENOUS
  Filled 2015-04-19: qty 5

## 2015-04-19 MED ORDER — SODIUM CHLORIDE 0.9% FLUSH
10.0000 mL | INTRAVENOUS | Status: DC | PRN
  Administered 2015-04-19: 10 mL via INTRAVENOUS
  Filled 2015-04-19: qty 10

## 2015-04-19 NOTE — Progress Notes (Signed)
[place]  [phone] [phone]   ID: JOAN HERSCHBERGER DOB: [birth year]  MR#: [id number]  AQT#:[id number]  Patient Care Team: Delilah Shan, MD as PCP - General (Family Medicine) [name], MD as Consulting Physician (General Surgery) [name], MD as Consulting Physician (Oncology) [name], MD as Attending Physician (Radiation Oncology) [name], RN as Registered Nurse [name], RN as Registered Nurse [name], NP as Nurse Practitioner (Nurse Practitioner) PCP: [name], MD GYN: [name] MD OTHER MD:  CHIEF COMPLAINT: Estrogen receptor positive breast cancer  CURRENT TREATMENT: adjuvant chemotherapy  BREAST CANCER HISTORY: From the original intake note:  [name] (who is the daughter of my former patient, [name], herself now 10 years out from her T1 cN0 invasive ductal carcinoma, treated with anti-estrogens only) went [facility] April 20 03/23/2014. Breast density was category C. A possible mass in the left breast upper outer quadrant was felt to warrant further evaluation, and on 07/04/2014 the patient underwent left mammography with tomosynthesis and left breast ultrasonography. At this point the patient felt she was able to palpate a mass in the area in question. Tomosynthesis did reveal an irregular mass in the upper outer left breast measuring 2.3 cm, associated on physical exam with a large area of thickening. Ultrasound of the area in question confirmed an irregular mass at the 1:00 position 5 cm from the nipple measuring 2.2 cm. The left axilla was negative sonographically.  Biopsy of the mass in question the same day, 07/04/2014, showed ([id number] [id number]) an invasive lobular breast cancer which was estrogen receptor 50% positive with strong staining intensity, progesterone receptor 1% positive with moderate staining intensity, with a proliferation  marker of 1%, and no HER-2 amplification, the signals ratio being 1.19 and the number per cell 1.55.  On 07/08/2014 the patient underwent bilateral breast MRI. This showed no abnormal adenopathy and no abnormality in the right breast. In the left breast however there was a 9.3 area of abnormal enhancement involving all quadrants. Within the upper outer quadrant there was a denser area which measured up to 5.1 cm.  The patient's subsequent history is as detailed below  INTERVAL HISTORY: [name] returns today for follow-up of her estrogen receptor positive breast cancer. She completed her chemotherapy 03/07/2015 and is currently receiving radiation treatments. She is generally tolerating the radiation well, with mild fatigue as her main side effect. Her skin is only just beginning to turn slightly red.  REVIEW OF SYSTEMS: Lashunta can be a little bit short of breath sometimes when walking up stairs, but she gets to the top without stopping. She has joint pains here and there which are not more intense or persistent than before. Her hair is coming back nicely. The color and texture are surprising. She is eager to get her port removed. Aside from these issues a detailed review of systems today was noncontributory  PAST MEDICAL HISTORY: Past Medical History  Diagnosis Date  . Asthma     last trimester of pregnancy 3 yrs ago-  never had problems since then, no meds  . Breast cancer ([place])   . Anxiety     PAST SURGICAL HISTORY: Past Surgical History  Procedure Laterality Date  . Cesarean section      x 2  . Knee arthroscopy Left 07/27/2013    Procedure: LEFT ARTHROSCOPY KNEE WITH MEDIAL MENISCAL DEBRIDEMENT;  Surgeon: Gearlean Alf, MD;  Location: [facility];  Service: Orthopedics;  Laterality: Left;  Marland Kitchen Mastectomy w/ sentinel node biopsy Left 08/10/2014  . Mastectomy w/ sentinel node biopsy Bilateral 08/10/2014    Procedure: MASTECTOMY WITH SENTINEL LYMPH NODE BIOPSY;  Surgeon: [name], MD;   Location: [place];  Service: General;  Laterality: Bilateral;  . Portacath placement Right 09/07/2014    Procedure: INSERTION PORT-A-CATH;  Surgeon: [name], MD;  Location: [facility];  Service: General;  Laterality: Right;    FAMILY HISTORY Family History  Problem Relation Age of Onset  . Breast cancer Mother 32  . Lymphoma Maternal Aunt 71  . Uterine cancer Paternal Aunt 45  . Uterine cancer Paternal Grandmother 82  . Ovarian cancer Other 58    mat great aunt through [facility]  . Ovarian cancer Maternal Grandmother 63  The patient's father died with congestive heart failure at the age of 16. The patient's mother is living, age 41. She was diagnosed with breast cancer at age 49. The patient's paternal grandmother was diagnosed with uterine cancer at age 64 and her daughter, the patient's paternal aunt, also with uterine cancer at age 31. On the mother's side one maternal aunt was diagnosed with lymphoma and the other with throat cancer. A maternal great aunt at age 56 was diagnosed with ovarian cancer.   GYNECOLOGIC HISTORY:  No LMP recorded.  menarche age 55, first live birth age 79, which the patient is aware double as the risk of breast cancer. She is GX P2. She still having regular periods. 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 Her husband KIMRA KANTOR the third (goes by "[name]") works in Engineer, technical sales, although currently he is looking for work. Their children are [name] 12 and [name] 3. The patient is not a church attender    ADVANCED DIRECTIVES: Not in place   HEALTH MAINTENANCE: Social History  Substance Use Topics  . Smoking status: Former Smoker -- 1.00 packs/day for 8 years    Types: Cigarettes    Quit date: 07/20/2000  . Smokeless tobacco: Never Used  . Alcohol Use: Yes     Comment: socially     Colonoscopy:  PAP:  Bone density:  Lipid panel:  No Known Allergies  Current Outpatient  Prescriptions  Medication Sig Dispense Refill  . B Complex-C (B-COMPLEX WITH VITAMIN C) tablet Take 1 tablet by mouth daily.    Mariane Baumgarten Sodium (DULCOLAX STOOL SOFTENER PO) Take 3 tablets by mouth daily. Reported on 04/10/2015    . emollient (RADIAGEL) gel Apply topically as needed for wound care.    Marland Kitchen ibuprofen (ADVIL,MOTRIN) 200 MG tablet Take 600 mg by mouth every 6 (six) hours as needed. Reported on 03/07/2015    . LORazepam (ATIVAN) 0.5 MG tablet Take 1 tablet (0.5 mg total) by mouth at bedtime. 30 tablet 0  . naproxen sodium (ANAPROX) 220 MG tablet Take 220 mg by mouth 2 (two) times daily with a meal.    . non-metallic deodorant (ALRA) MISC Apply 1 application topically daily as needed.    . polyethylene glycol (MIRALAX / GLYCOLAX) packet Take 17 g by mouth daily. Reported on 04/10/2015    . Probiotic Product (ALIGN PO) Take 1 capsule by mouth daily.    . tamoxifen (NOLVADEX) 20 MG tablet Reported on 04/17/2015    . venlafaxine XR (EFFEXOR-XR) 75 MG 24 hr capsule Take 1 capsule (75 mg total) by mouth daily with  breakfast. (Patient not taking: Reported on 04/17/2015) 90 capsule 3  . zolpidem (AMBIEN) 5 MG tablet Take 1 tablet (5 mg total) by mouth at bedtime as needed for sleep. (Patient not taking: Reported on 04/10/2015) 30 tablet 0   No current facility-administered medications for this visit.   Facility-Administered Medications Ordered in Other Visits  Medication Dose Route Frequency Provider Last Rate Last Dose  . sodium chloride 0.9 % injection 10 mL  10 mL Intravenous PRN [name], MD   10 mL at 12/20/14 0908    OBJECTIVE: young White woman who appears well Filed Vitals:   04/19/15 1043  BP: 110/63  Pulse: 77  Temp: 98.3 F (36.8 C)  Resp: 18     Body mass index is 33.6 kg/(m^2).      ECOG FS 1  Sclerae unicteric, EOMs intact Oropharynx clear, dentition in good repair No cervical or supraclavicular adenopathy Lungs no rales or rhonchi Heart regular rate and  rhythm Abd soft, nontender, positive bowel sounds MSK no focal spinal tenderness, no upper extremity lymphedema Neuro: nonfocal, well oriented, appropriate affect Breasts: She is status post bilateral mastectomies. There is no evidence of chest wall recurrence. Both axillae are benign.   LAB RESULTS:  INo results found for: SPEP, UPEP  CBC Latest Ref Rng 04/19/2015 03/07/2015 02/28/2015  WBC 3.9 - 10.3 10e3/uL 5.6 5.3 4.4  Hemoglobin 11.6 - 15.9 g/dL 14.1 13.0 12.1  Hematocrit 34.8 - 46.6 % 42.3 38.8 36.0  Platelets 145 - 400 10e3/uL 211 276 272   CMP Latest Ref Rng 04/19/2015 03/07/2015 02/28/2015  Glucose 70 - 140 mg/dl 93 103 100  BUN 7.0 - 26.0 mg/dL 12.3 14.0 7.2  Creatinine 0.6 - 1.1 mg/dL 0.7 0.7 0.7  Sodium 136 - 145 mEq/L 139 138 140  Potassium 3.5 - 5.1 mEq/L 4.3 4.3 4.4  CO2 22 - 29 mEq/L _0 Calcium 8.4 - 10.4 mg/dL 9.6 9.5 9.3  Total Protein 6.4 - 8.3 g/dL 7.3 7.2 6.7  Total Bilirubin 0.20 - 1.20 mg/dL 0.39 0.34 0.33  Alkaline Phos 40 - 150 U/L 91 89 98  AST 5 - 34 U/L _1 ALT 0 - 55 U/L 25 26 44     STUDIES: No results found.  ASSESSMENT: 50 y.o. BRCA negative [place] woman status post left breast biopsy 07/04/2014 for a clinical T3 N0, stage IIA invasive lobular breast cancer, grade 2, estrogen receptor positive, progesterone receptor 1% "positive", with an MIB-1 of less than 5% and no HER-2 amplification  (1) genetics testing 07/29/2014 through the [employer] gene panel offered by [employer] found no deleterious mutations in ATM, BARD1, BRCA1, BRCA2, BRIP1, CDH1, CHEK2, EPCAM, MLH1, MRE11A, MSH2, MSH6, MUTYH, NBN, NF1, PALB2, PMS2, PTEN, RAD50, RAD51C, RAD51D, SMARCA4, STK11, or TP53.   (2) status post bilateral mastectomies 08/10/2014 showing  (a) on the right, lobular carcinoma in situ  (b) on the left, a pT3 pN1, stage IIIA invasive lobular breast cancer, HER-2 repeated and again negative  (3) chemotherapy started 10/11/2014,consisting of  doxorubicin and cyclophosphamide in dose dense fashion 4, completed 11/22/2014, followed by paclitaxel weekly 6 started 12/06/2014. Abraxane given once, as cycle #7,and tolerated poorly. Completed 5 additional doses of paclitaxel 03/07/2015  (4) radiation to fbe completed 05/10/2015  (5) tamoxifen was started neoadjuvantly on 05/11/2016it is being held during chemotherapy, to be resumed mid-March 2017  (6) 5 mm cystic lesion in the tail of the pancreas noted by CT scan of the  abdomen and pelvis 08/31/2014   PLAN: Lota recovered very nicely from chemotherapy and does not have any significant residuals. She is tolerating her radiation well.  Once she completes the radiation she can go back to tamoxifen, which she tolerated well previously. She really has the medication and hand. She has a good understanding of the possible toxicities, side effects and complications. The plan will be to continue anti-estrogens in minimum of 5 years, more likely 10.  She would like her port removed as soon as possible. I will send Dr. Marlou Starks a note suggesting late March as a good time to get this accomplished.  We do need to follow up on the small cystic lesion noted in her pancreas. I reassured her today that these are usually benign lesions. She will have an MRI of the abdomen shortly before her return visit here in May.  In the meantime I have encouraged her to exercise on a regular basis, the goal being 45 minutes 5 times a week  She knows to call for any problems that may develop before the next visit.  [name], MD   04/19/2015 11:19 AM

## 2015-04-19 NOTE — Patient Instructions (Signed)

## 2015-04-19 NOTE — Telephone Encounter (Signed)
Appointments made and avs printed °

## 2015-04-20 ENCOUNTER — Ambulatory Visit
Admission: RE | Admit: 2015-04-20 | Discharge: 2015-04-20 | Disposition: A | Discharge status: Home / Self Care | Payer: BLUE CROSS/BLUE SHIELD | Source: Ambulatory Visit | Attending: Radiation Oncology | Admitting: Radiation Oncology

## 2015-04-20 DIAGNOSIS — Z510 Encounter for antineoplastic radiation therapy: Secondary | ICD-10-CM | POA: Diagnosis not present

## 2015-04-21 ENCOUNTER — Ambulatory Visit
Admission: RE | Admit: 2015-04-21 | Discharge: 2015-04-21 | Disposition: A | Discharge status: Home / Self Care | Payer: BLUE CROSS/BLUE SHIELD | Source: Ambulatory Visit | Attending: Radiation Oncology | Admitting: Radiation Oncology

## 2015-04-21 DIAGNOSIS — Z510 Encounter for antineoplastic radiation therapy: Secondary | ICD-10-CM | POA: Diagnosis not present

## 2015-04-24 ENCOUNTER — Ambulatory Visit
Admission: RE | Admit: 2015-04-24 | Discharge: 2015-04-24 | Disposition: A | Discharge status: Home / Self Care | Payer: BLUE CROSS/BLUE SHIELD | Source: Ambulatory Visit | Attending: Radiation Oncology | Admitting: Radiation Oncology

## 2015-04-24 DIAGNOSIS — Z510 Encounter for antineoplastic radiation therapy: Secondary | ICD-10-CM | POA: Diagnosis not present

## 2015-04-25 ENCOUNTER — Encounter: Payer: Self-pay | Admitting: Radiation Oncology

## 2015-04-25 ENCOUNTER — Ambulatory Visit
Admission: RE | Admit: 2015-04-25 | Discharge: 2015-04-25 | Disposition: A | Discharge status: Home / Self Care | Payer: BLUE CROSS/BLUE SHIELD | Source: Ambulatory Visit | Attending: Radiation Oncology | Admitting: Radiation Oncology

## 2015-04-25 VITALS — BP 106/65 | HR 76 | Temp 98.3°F | Ht 68.0 in | Wt 219.8 lb

## 2015-04-25 DIAGNOSIS — C50412 Malignant neoplasm of upper-outer quadrant of left female breast: Secondary | ICD-10-CM | POA: Diagnosis not present

## 2015-04-25 DIAGNOSIS — Z9221 Personal history of antineoplastic chemotherapy: Secondary | ICD-10-CM | POA: Insufficient documentation

## 2015-04-25 DIAGNOSIS — Z923 Personal history of irradiation: Secondary | ICD-10-CM | POA: Diagnosis not present

## 2015-04-25 DIAGNOSIS — Z510 Encounter for antineoplastic radiation therapy: Secondary | ICD-10-CM | POA: Diagnosis not present

## 2015-04-25 MED ORDER — SONAFINE EX EMUL
1.0000 "application " | Freq: Once | CUTANEOUS | Status: AC
  Administered 2015-04-25: 1 via TOPICAL
  Filled 2015-04-25: qty 45

## 2015-04-25 NOTE — Addendum Note (Signed)
Encounter addended by: Ernst Spell, RN on: 04/25/2015 10:22 AM<BR>     Documentation filed: Inpatient MAR

## 2015-04-25 NOTE — Progress Notes (Signed)
  Radiation Oncology         [phone] ________________________________  Name: Lori [name] MRN: [id number]  Date: 04/25/2015  DOB: 25-Aug-1965  Weekly Radiation Therapy Management  Breast cancer of upper-outer quadrant of left female breast ([place]) - Plan: SONAFINE emulsion 1 application  Current Dose: 36 Gy     Planned Dose:  60 Gy  Narrative . . . . . . . . The patient presents for routine under treatment assessment. Lori [name] presents for her 19th fraction of radiation to her Left Chest Wall. She reports fatigue and will nap occasionally. She reports she gets tired easily and needs to rest while doing chores, etc. She reports her fatigue has improved since completing chemotherapy. Her Left Chest wall is red, and reports pruritis at the upper central area. She reports pain in that area when she itches it She is using the radiaplex cream as directed. She is using hydrocortizone cream to the area TID.                                 Set-up films were reviewed.                                 The chart was checked. Physical Findings. . .  height is 5\' 8"  (1.727 m) and weight is 219 lb 12.8 oz (99.701 kg). Her temperature is 98.3 F (36.8 C). Her blood pressure is 106/65 and her pulse is 76.   Lungs are clear to auscultation bilaterally. Heart has regular rate and rhythm. No palpable cervical, supraclavicular, or axillary adenopathy. Dermatitis in the upper inner aspect of the left chest wall. No signs of moist desquamation. Impression . . . . . . . The patient is tolerating radiation. Plan . . . . . . . . . . . . Continue treatment as planned. The nurse provided her with sonafine cream today to help with the pruritus. ________________________________   Blair Promise, PhD, MD  This document serves as a record of services personally performed by Gery Pray, MD. It was created on his behalf by Darcus Austin, a trained medical scribe. The creation of this record is based on the scribe's  personal observations and the provider's statements to them. This document has been checked and approved by the attending provider.

## 2015-04-25 NOTE — Progress Notes (Signed)
Lori [name] presents for her 19th fraction of radiation to her Left Chest Wall. She reports fatigue, and will nap occasionally. She reports she gets tired easily and needs to rest while doing chores, etc. She reports her fatigue has improved since completing chemotherapy. Her Left Chest wall is red, and itches at the upper central area. She is using the radiaplex cream as directed. She is using hydrocortizone cream the the itchy area 3 times daily. I will provide her with sonafine cream today to help with the itching.   BP 106/65 mmHg  Pulse 76  Temp(Src) 98.3 F (36.8 C)  Ht 5\' 8"  (1.727 m)  Wt 219 lb 12.8 oz (99.701 kg)  BMI 33.43 kg/m2

## 2015-04-26 ENCOUNTER — Ambulatory Visit
Admission: RE | Admit: 2015-04-26 | Discharge: 2015-04-26 | Disposition: A | Discharge status: Home / Self Care | Payer: BLUE CROSS/BLUE SHIELD | Source: Ambulatory Visit | Attending: Radiation Oncology | Admitting: Radiation Oncology

## 2015-04-26 DIAGNOSIS — Z510 Encounter for antineoplastic radiation therapy: Secondary | ICD-10-CM | POA: Diagnosis not present

## 2015-04-27 ENCOUNTER — Ambulatory Visit
Admission: RE | Admit: 2015-04-27 | Discharge: 2015-04-27 | Disposition: A | Discharge status: Home / Self Care | Payer: BLUE CROSS/BLUE SHIELD | Source: Ambulatory Visit | Attending: Radiation Oncology | Admitting: Radiation Oncology

## 2015-04-27 DIAGNOSIS — Z510 Encounter for antineoplastic radiation therapy: Secondary | ICD-10-CM | POA: Diagnosis not present

## 2015-04-28 ENCOUNTER — Ambulatory Visit
Admission: RE | Admit: 2015-04-28 | Discharge: 2015-04-28 | Disposition: A | Discharge status: Home / Self Care | Payer: BLUE CROSS/BLUE SHIELD | Source: Ambulatory Visit | Attending: Radiation Oncology | Admitting: Radiation Oncology

## 2015-04-28 DIAGNOSIS — Z510 Encounter for antineoplastic radiation therapy: Secondary | ICD-10-CM | POA: Diagnosis not present

## 2015-05-01 ENCOUNTER — Ambulatory Visit
Admission: RE | Admit: 2015-05-01 | Discharge: 2015-05-01 | Disposition: A | Discharge status: Home / Self Care | Payer: BLUE CROSS/BLUE SHIELD | Source: Ambulatory Visit | Attending: Radiation Oncology | Admitting: Radiation Oncology

## 2015-05-01 ENCOUNTER — Encounter: Payer: Self-pay | Admitting: Radiation Oncology

## 2015-05-01 DIAGNOSIS — C50412 Malignant neoplasm of upper-outer quadrant of left female breast: Secondary | ICD-10-CM

## 2015-05-01 DIAGNOSIS — Z510 Encounter for antineoplastic radiation therapy: Secondary | ICD-10-CM | POA: Diagnosis not present

## 2015-05-01 NOTE — Progress Notes (Signed)
   Weekly Management Note:  Outpatient    ICD-9-CM ICD-10-CM   1. Breast cancer of upper-outer quadrant of left female breast ([facility]) 174.4 C50.412     Current Dose: 46 Gy  Projected Dose: 60 Gy   Narrative:  The patient presents for routine under treatment assessment.  CBCT/MVCT images/Port film x-rays were reviewed.  The chart was checked. Doing well - some increased itching and "burning" sensation over UIQ of left chest wall. Using Sonafine and hydrocortisone 1% cream  Physical Findings:  vitals were not taken for this visit.  Wt Readings from Last 3 Encounters:  04/25/15 219 lb 12.8 oz (99.701 kg)  04/19/15 220 lb 14.4 oz (100.2 kg)  04/17/15 220 lb 12.8 oz (100.154 kg)   erythema in treatment fields over skin - dry desquamation and hyperpigmentation in UIQ of left chest wall   Impression:  The patient is tolerating radiotherapy.  Plan:  Continue radiotherapy as planned.  Continue current skin care, use sonafine TID  ________________________________   [name], M.D.

## 2015-05-02 ENCOUNTER — Ambulatory Visit
Admission: RE | Admit: 2015-05-02 | Discharge: 2015-05-02 | Disposition: A | Discharge status: Home / Self Care | Payer: BLUE CROSS/BLUE SHIELD | Source: Ambulatory Visit | Attending: Radiation Oncology | Admitting: Radiation Oncology

## 2015-05-02 DIAGNOSIS — Z510 Encounter for antineoplastic radiation therapy: Secondary | ICD-10-CM | POA: Diagnosis not present

## 2015-05-03 ENCOUNTER — Ambulatory Visit
Admission: RE | Admit: 2015-05-03 | Discharge: 2015-05-03 | Disposition: A | Discharge status: Home / Self Care | Payer: BLUE CROSS/BLUE SHIELD | Source: Ambulatory Visit | Attending: Radiation Oncology | Admitting: Radiation Oncology

## 2015-05-03 DIAGNOSIS — Z510 Encounter for antineoplastic radiation therapy: Secondary | ICD-10-CM | POA: Diagnosis not present

## 2015-05-04 ENCOUNTER — Ambulatory Visit
Admission: RE | Admit: 2015-05-04 | Discharge: 2015-05-04 | Disposition: A | Discharge status: Home / Self Care | Payer: BLUE CROSS/BLUE SHIELD | Source: Ambulatory Visit | Attending: Radiation Oncology | Admitting: Radiation Oncology

## 2015-05-04 DIAGNOSIS — Z510 Encounter for antineoplastic radiation therapy: Secondary | ICD-10-CM | POA: Diagnosis not present

## 2015-05-05 ENCOUNTER — Ambulatory Visit
Admission: RE | Admit: 2015-05-05 | Discharge: 2015-05-05 | Disposition: A | Discharge status: Home / Self Care | Payer: BLUE CROSS/BLUE SHIELD | Source: Ambulatory Visit | Attending: Radiation Oncology | Admitting: Radiation Oncology

## 2015-05-05 DIAGNOSIS — Z510 Encounter for antineoplastic radiation therapy: Secondary | ICD-10-CM | POA: Diagnosis not present

## 2015-05-08 ENCOUNTER — Encounter: Payer: Self-pay | Admitting: Radiation Oncology

## 2015-05-08 ENCOUNTER — Ambulatory Visit
Admission: RE | Admit: 2015-05-08 | Discharge: 2015-05-08 | Disposition: A | Discharge status: Home / Self Care | Payer: BLUE CROSS/BLUE SHIELD | Source: Ambulatory Visit | Attending: Radiation Oncology | Admitting: Radiation Oncology

## 2015-05-08 VITALS — BP 109/66 | HR 87 | Temp 98.0°F | Ht 68.0 in | Wt 219.5 lb

## 2015-05-08 DIAGNOSIS — Z510 Encounter for antineoplastic radiation therapy: Secondary | ICD-10-CM | POA: Diagnosis not present

## 2015-05-08 DIAGNOSIS — C50412 Malignant neoplasm of upper-outer quadrant of left female breast: Secondary | ICD-10-CM

## 2015-05-08 NOTE — Progress Notes (Signed)
   Weekly Management Note:  Outpatient    ICD-9-CM ICD-10-CM   1. Breast cancer of upper-outer quadrant of left female breast ([facility]) 174.4 C50.412     Current Dose: 56 Gy  Projected Dose: 60 Gy   Narrative:  The patient presents for routine under treatment assessment.  CBCT/MVCT images/Port film x-rays were reviewed.  The chart was checked.  Cedra Etsitty has completed 28 fractions to her left chest wall.  She reports pain in her left chest wall/underarm at a 9/10.  She reports having trouble sleeping and has been taking either ativan or ambien to help her sleep.  She is using radiaplex and neosporin.    She has been given a one month follow up appointment.  BP 109/66 mmHg  Pulse 87  Temp(Src) 98 F (36.7 C) (Oral)  Ht 5\' 8"  (1.727 m)  Wt 219 lb 8 oz (99.565 kg)  BMI 33.38 kg/m2   Physical Findings:  height is 5\' 8"  (1.727 m) and weight is 219 lb 8 oz (99.565 kg). Her oral temperature is 98 F (36.7 C). Her blood pressure is 109/66 and her pulse is 87.   Wt Readings from Last 3 Encounters:  05/08/15 219 lb 8 oz (99.565 kg)  04/25/15 219 lb 12.8 oz (99.701 kg)  04/19/15 220 lb 14.4 oz (100.2 kg)   erythema in treatment fields over skin - dry and punctate moist desquamation in UIQ of left chest wall w/ bright erythema; dry peel of left axilla.    Impression:  The patient is tolerating radiotherapy.  Plan:  Continue radiotherapy as planned.  Continue current skin care, use sonafine TID; and add neosporin to desquamated areas.  Pt declines Rx pain meds.  Should feel better in less than a week, I except.  Call if issues arise before 1 mo f/u.  ________________________________   [name], M.D.

## 2015-05-08 NOTE — Progress Notes (Addendum)
Lori [name] has completed 28 fractions to her left chest wall.  She reports pain in her left chest wall/underarm at a 9/10.  She reports having trouble sleeping and has been taking either ativan or ambien to help her sleep.  She is using radiaplex and neosporin.  The skin on her left chest is red with small peeling areas.  She has been given a one month follow up appointment.  BP 109/66 mmHg  Pulse 87  Temp(Src) 98 F (36.7 C) (Oral)  Ht 5\' 8"  (1.727 m)  Wt 219 lb 8 oz (99.565 kg)  BMI 33.38 kg/m2

## 2015-05-09 ENCOUNTER — Ambulatory Visit
Admission: RE | Admit: 2015-05-09 | Discharge: 2015-05-09 | Disposition: A | Discharge status: Home / Self Care | Payer: BLUE CROSS/BLUE SHIELD | Source: Ambulatory Visit | Attending: Radiation Oncology | Admitting: Radiation Oncology

## 2015-05-09 DIAGNOSIS — Z510 Encounter for antineoplastic radiation therapy: Secondary | ICD-10-CM | POA: Diagnosis not present

## 2015-05-10 ENCOUNTER — Encounter: Payer: Self-pay | Admitting: *Deleted

## 2015-05-10 ENCOUNTER — Ambulatory Visit
Admission: RE | Admit: 2015-05-10 | Discharge: 2015-05-10 | Disposition: A | Discharge status: Home / Self Care | Payer: BLUE CROSS/BLUE SHIELD | Source: Ambulatory Visit | Attending: Radiation Oncology | Admitting: Radiation Oncology

## 2015-05-10 ENCOUNTER — Encounter: Payer: Self-pay | Admitting: Radiation Oncology

## 2015-05-10 DIAGNOSIS — Z510 Encounter for antineoplastic radiation therapy: Secondary | ICD-10-CM | POA: Diagnosis not present

## 2015-05-17 ENCOUNTER — Other Ambulatory Visit: Payer: Self-pay | Admitting: Adult Health

## 2015-05-17 DIAGNOSIS — C50412 Malignant neoplasm of upper-outer quadrant of left female breast: Secondary | ICD-10-CM

## 2015-05-25 ENCOUNTER — Encounter (HOSPITAL_BASED_OUTPATIENT_CLINIC_OR_DEPARTMENT_OTHER): Payer: Self-pay | Admitting: *Deleted

## 2015-05-26 NOTE — Progress Notes (Signed)
  Radiation Oncology         ([phone] ________________________________  Name: Lori [name] MRN: [id number]  Date: 05/10/2015  DOB: [birth year]  End of Treatment Note  DIAGNOSIS:    ICD-9-CM ICD-10-CM   1. Breast cancer of upper-outer quadrant of left female breast ([place]) 174.4 C50.412       pT3N1a cM0 Stage III left breast invasive lobular carcinoma with LCIS, Grade 2, ER 50%  PR 1% HER2 neg  Indication for treatment:  Curative       Radiation treatment dates:   03/30/2015-05/10/2015  Site/dose:   1. Left chest wall and regional nodes / 50 Gy in 25 fractions to chest wall , 45 Gy in 25 fractions to SCV/PAB                      2. Left chest wall scar boost / 10 Gy in 5 fractions  Beams/energy:   1. 3-D [place] / 10X, 15X, 6X                              2. En face / 6 MeV  Narrative: The patient tolerated radiation treatment relatively well.   She experienced pain reported at 9/10 in her left chest wall/underarm. She declined Rx pain medications to manage this pain.   Plan: The patient has completed radiation treatment. The patient will return to radiation oncology clinic for routine followup in one month. I advised them to call or return sooner if they have any questions or concerns related to their recovery or treatment.  -----------------------------------  [name], MD   This document serves as a record of services personally performed by [name], MD. It was created on her behalf by Arlyce Harman, a trained medical scribe. The creation of this record is based on the scribe's personal observations and the provider's statements to them. This document has been checked and approved by the attending provider.

## 2015-05-30 ENCOUNTER — Other Ambulatory Visit: Payer: Self-pay | Admitting: General Surgery

## 2015-06-01 ENCOUNTER — Ambulatory Visit (HOSPITAL_BASED_OUTPATIENT_CLINIC_OR_DEPARTMENT_OTHER): Payer: BLUE CROSS/BLUE SHIELD | Admitting: Anesthesiology

## 2015-06-01 ENCOUNTER — Ambulatory Visit (HOSPITAL_BASED_OUTPATIENT_CLINIC_OR_DEPARTMENT_OTHER)
Admission: RE | Admit: 2015-06-01 | Discharge: 2015-06-01 | Disposition: A | Discharge status: Home / Self Care | Payer: BLUE CROSS/BLUE SHIELD | Source: Ambulatory Visit | Attending: General Surgery | Admitting: General Surgery

## 2015-06-01 ENCOUNTER — Encounter (HOSPITAL_BASED_OUTPATIENT_CLINIC_OR_DEPARTMENT_OTHER)
Admission: RE | Disposition: A | Discharge status: Home / Self Care | Payer: Self-pay | Source: Ambulatory Visit | Attending: General Surgery

## 2015-06-01 ENCOUNTER — Encounter (HOSPITAL_BASED_OUTPATIENT_CLINIC_OR_DEPARTMENT_OTHER): Payer: Self-pay | Admitting: *Deleted

## 2015-06-01 DIAGNOSIS — Z9221 Personal history of antineoplastic chemotherapy: Secondary | ICD-10-CM | POA: Insufficient documentation

## 2015-06-01 DIAGNOSIS — Z170 Estrogen receptor positive status [ER+]: Secondary | ICD-10-CM | POA: Diagnosis not present

## 2015-06-01 DIAGNOSIS — Z79810 Long term (current) use of selective estrogen receptor modulators (SERMs): Secondary | ICD-10-CM | POA: Insufficient documentation

## 2015-06-01 DIAGNOSIS — Z87891 Personal history of nicotine dependence: Secondary | ICD-10-CM | POA: Diagnosis not present

## 2015-06-01 DIAGNOSIS — Z79899 Other long term (current) drug therapy: Secondary | ICD-10-CM | POA: Insufficient documentation

## 2015-06-01 DIAGNOSIS — Z853 Personal history of malignant neoplasm of breast: Secondary | ICD-10-CM | POA: Diagnosis not present

## 2015-06-01 DIAGNOSIS — Z452 Encounter for adjustment and management of vascular access device: Secondary | ICD-10-CM | POA: Insufficient documentation

## 2015-06-01 DIAGNOSIS — Z9013 Acquired absence of bilateral breasts and nipples: Secondary | ICD-10-CM | POA: Diagnosis not present

## 2015-06-01 HISTORY — DX: Unspecified osteoarthritis, unspecified site: M19.90

## 2015-06-01 HISTORY — PX: PORT-A-CATH REMOVAL: SHX5289

## 2015-06-01 HISTORY — DX: Personal history of malignant neoplasm of breast: Z85.3

## 2015-06-01 SURGERY — REMOVAL PORT-A-CATH
Anesthesia: Monitor Anesthesia Care | Site: Chest | Laterality: Right

## 2015-06-01 MED ORDER — KETOROLAC TROMETHAMINE 30 MG/ML IJ SOLN: 30.0000 mg | Freq: Once | INTRAMUSCULAR | Status: DC

## 2015-06-01 MED ORDER — ONDANSETRON HCL 4 MG/2ML IJ SOLN
INTRAMUSCULAR | Status: AC
  Filled 2015-06-01: qty 2

## 2015-06-01 MED ORDER — MIDAZOLAM HCL 2 MG/2ML IJ SOLN
INTRAMUSCULAR | Status: AC
  Filled 2015-06-01: qty 2

## 2015-06-01 MED ORDER — BUPIVACAINE-EPINEPHRINE (PF) 0.25% -1:200000 IJ SOLN
INTRAMUSCULAR | Status: AC
  Filled 2015-06-01: qty 30

## 2015-06-01 MED ORDER — CHLORHEXIDINE GLUCONATE 4 % EX LIQD: 1.0000 "application " | Freq: Once | CUTANEOUS | Status: DC

## 2015-06-01 MED ORDER — BUPIVACAINE-EPINEPHRINE 0.25% -1:200000 IJ SOLN
INTRAMUSCULAR | Status: DC | PRN
Start: 2015-06-01 — End: 2015-06-01
  Administered 2015-06-01: 9 mL

## 2015-06-01 MED ORDER — LIDOCAINE HCL (PF) 1 % IJ SOLN
INTRAMUSCULAR | Status: DC | PRN
  Administered 2015-06-01: 9 mL

## 2015-06-01 MED ORDER — FENTANYL CITRATE (PF) 100 MCG/2ML IJ SOLN: 25.0000 ug | INTRAMUSCULAR | Status: DC | PRN

## 2015-06-01 MED ORDER — HYDROCODONE-ACETAMINOPHEN 7.5-325 MG PO TABS: 1.0000 | ORAL_TABLET | Freq: Once | ORAL | Status: DC | PRN

## 2015-06-01 MED ORDER — LIDOCAINE HCL (PF) 1 % IJ SOLN
INTRAMUSCULAR | Status: AC
  Filled 2015-06-01: qty 30

## 2015-06-01 MED ORDER — PROPOFOL 10 MG/ML IV BOLUS
INTRAVENOUS | Status: AC
  Filled 2015-06-01: qty 20

## 2015-06-01 MED ORDER — PROPOFOL 10 MG/ML IV BOLUS
INTRAVENOUS | Status: DC | PRN
Start: 2015-06-01 — End: 2015-06-01
  Administered 2015-06-01 (×3): 20 mg via INTRAVENOUS
  Administered 2015-06-01: 40 mg via INTRAVENOUS
  Administered 2015-06-01 (×6): 20 mg via INTRAVENOUS

## 2015-06-01 MED ORDER — SCOPOLAMINE 1 MG/3DAYS TD PT72: 1.0000 | MEDICATED_PATCH | Freq: Once | TRANSDERMAL | Status: DC | PRN

## 2015-06-01 MED ORDER — ONDANSETRON HCL 4 MG/2ML IJ SOLN
INTRAMUSCULAR | Status: DC | PRN
  Administered 2015-06-01: 4 mg via INTRAVENOUS

## 2015-06-01 MED ORDER — FENTANYL CITRATE (PF) 100 MCG/2ML IJ SOLN
50.0000 ug | INTRAMUSCULAR | Status: DC | PRN
  Administered 2015-06-01: 100 ug via INTRAVENOUS

## 2015-06-01 MED ORDER — LACTATED RINGERS IV SOLN
INTRAVENOUS | Status: DC
  Administered 2015-06-01: 13:00:00 via INTRAVENOUS

## 2015-06-01 MED ORDER — FENTANYL CITRATE (PF) 100 MCG/2ML IJ SOLN
INTRAMUSCULAR | Status: AC
  Filled 2015-06-01: qty 2

## 2015-06-01 MED ORDER — IBUPROFEN 200 MG PO TABS: 200.0000 mg | ORAL_TABLET | Freq: Four times a day (QID) | ORAL | Status: DC | PRN

## 2015-06-01 MED ORDER — MIDAZOLAM HCL 2 MG/2ML IJ SOLN
1.0000 mg | INTRAMUSCULAR | Status: DC | PRN
  Administered 2015-06-01: 2 mg via INTRAVENOUS

## 2015-06-01 MED ORDER — MEPERIDINE HCL 25 MG/ML IJ SOLN: 6.2500 mg | INTRAMUSCULAR | Status: DC | PRN

## 2015-06-01 MED ORDER — GLYCOPYRROLATE 0.2 MG/ML IJ SOLN: 0.2000 mg | Freq: Once | INTRAMUSCULAR | Status: DC | PRN

## 2015-06-01 MED ORDER — ONDANSETRON HCL 4 MG/2ML IJ SOLN: 4.0000 mg | Freq: Once | INTRAMUSCULAR | Status: DC | PRN

## 2015-06-01 MED ORDER — IBUPROFEN 100 MG/5ML PO SUSP: 200.0000 mg | Freq: Four times a day (QID) | ORAL | Status: DC | PRN

## 2015-06-01 SURGICAL SUPPLY — 29 items
BLADE SURG 15 STRL LF DISP TIS (BLADE) ×1 IMPLANT
BLADE SURG 15 STRL SS (BLADE) ×2
CHLORAPREP W/TINT 26ML (MISCELLANEOUS) ×3 IMPLANT
COVER BACK TABLE 60X90IN (DRAPES) ×3 IMPLANT
COVER MAYO STAND STRL (DRAPES) ×3 IMPLANT
DECANTER SPIKE VIAL GLASS SM (MISCELLANEOUS) ×3 IMPLANT
DRAPE LAPAROTOMY 100X72 PEDS (DRAPES) ×3 IMPLANT
DRAPE UTILITY XL STRL (DRAPES) ×3 IMPLANT
ELECT COATED BLADE 2.86 ST (ELECTRODE) ×3 IMPLANT
ELECT REM PT RETURN 9FT ADLT (ELECTROSURGICAL) ×3
ELECTRODE REM PT RTRN 9FT ADLT (ELECTROSURGICAL) ×1 IMPLANT
GLOVE BIO SURGEON STRL SZ 6.5 (GLOVE) ×2 IMPLANT
GLOVE BIO SURGEON STRL SZ7.5 (GLOVE) ×3 IMPLANT
GLOVE BIO SURGEONS STRL SZ 6.5 (GLOVE) ×1
GLOVE BIOGEL PI IND STRL 7.0 (GLOVE) ×1 IMPLANT
GLOVE BIOGEL PI INDICATOR 7.0 (GLOVE) ×2
GOWN STRL REUS W/ TWL LRG LVL3 (GOWN DISPOSABLE) ×2 IMPLANT
GOWN STRL REUS W/TWL LRG LVL3 (GOWN DISPOSABLE) ×4
LIQUID BAND (GAUZE/BANDAGES/DRESSINGS) ×3 IMPLANT
NEEDLE HYPO 25X1 1.5 SAFETY (NEEDLE) ×3 IMPLANT
PACK BASIN DAY SURGERY FS (CUSTOM PROCEDURE TRAY) ×3 IMPLANT
PENCIL BUTTON HOLSTER BLD 10FT (ELECTRODE) ×3 IMPLANT
SLEEVE SCD COMPRESS KNEE MED (MISCELLANEOUS) IMPLANT
SUT MON AB 4-0 PC3 18 (SUTURE) ×3 IMPLANT
SUT VIC AB 3-0 SH 27 (SUTURE) ×2
SUT VIC AB 3-0 SH 27X BRD (SUTURE) ×1 IMPLANT
SYR CONTROL 10ML LL (SYRINGE) ×3 IMPLANT
TOWEL OR 17X24 6PK STRL BLUE (TOWEL DISPOSABLE) ×3 IMPLANT
TOWEL OR NON WOVEN STRL DISP B (DISPOSABLE) ×3 IMPLANT

## 2015-06-01 NOTE — Anesthesia Procedure Notes (Signed)
Procedure Name: MAC Date/Time: 06/01/2015 3:22 PM Performed by: Baxter Flattery Pre-anesthesia Checklist: Patient identified, Emergency Drugs available, Suction available and Patient being monitored Patient Re-evaluated:Patient Re-evaluated prior to inductionOxygen Delivery Method: Simple face mask Preoxygenation: Pre-oxygenation with 100% oxygen Intubation Type: IV induction Ventilation: Mask ventilation without difficulty Dental Injury: Teeth and Oropharynx as per pre-operative assessment

## 2015-06-01 NOTE — Discharge Instructions (Signed)

## 2015-06-01 NOTE — Transfer of Care (Signed)
Immediate Anesthesia Transfer of Care Note  Patient: Lori [name]  Procedure(s) Performed: Procedure(s): REMOVAL PORT-A-CATH (Right)  Patient Location: PACU  Anesthesia Type:MAC  Level of Consciousness: awake, alert , oriented and patient cooperative  Airway & Oxygen Therapy: Patient Spontanous Breathing and Patient connected to face mask oxygen  Post-op Assessment: Report given to RN, Post -op Vital signs reviewed and stable and Patient moving all extremities  Post vital signs: Reviewed and stable  Last Vitals:  Filed Vitals:   06/01/15 1318  BP: 115/69  Pulse: 79  Temp: 36.8 C  Resp: 18    Complications: No apparent anesthesia complications

## 2015-06-01 NOTE — H&P (Signed)
  Lori [name]  Location: [facility] Patient #: [id number] DOB: December 07, 1965 Married / Language: English / Race: White Female   History of Present Illness  Patient words: breast recheck.  The patient is a 50 year old female who presents for a follow-up for Breast cancer. The patient is a 50 year old white female who is 2 months status post right prophylactic mastectomy and left modified radical mastectomy for a T3 N1 a left breast cancer. She was ER positive and HER-2 negative with a Ki-67 of 1%. She developed a small hematoma in the left axilla after her echocardiogram. This was aspirated and cultured. No organisms were seen but she did grow a few colonies of strep viridans. She is on Augmentin. She feels better and has no complaints today. She is concerned because she is scheduled to start chemotherapy next week.   Allergies  No Known Drug Allergies06/14/2016  Medication History  Ambien ('5MG'$  Tablet, 1 (one) Tablet Oral 1 po qhs prn, Taken starting 08/23/2014) Active. Tamoxifen Citrate ('20MG'$  Tablet, Oral daily) Active. Tylenol Extra Strength ('500MG'$  Tablet, Oral as needed) Active. IBU-200 ('200MG'$  Tablet, Oral as needed) Active. MiraLax (Oral daily) Active. Medications Reconciled  Vitals  Weight: 211 lb Height: 68in Body Surface Area: 2.14 m Body Mass Index: 32.08 kg/m  Temp.: 97.10F(Oral)  Pulse: 74 (Regular)  BP: 122/70 (Sitting, Left Arm, Standard)     Physical Exam  Breast Note: Both mastectomy incisions are healing nicely with no sign of infection or seroma. The left axilla is much softer. There is no cellulitis.     Assessment & Plan  PRIMARY CANCER OF UPPER OUTER QUADRANT OF LEFT FEMALE BREAST (174.4  C50.412) Impression: The patient is about 2 months status post right prophylactic mastectomy and left modified radical mastectomy. She recently developed a small hematoma in the left axilla which was aspirated. No organisms were  seen but it did grow a couple colonies of strep viridans. At this point she will finish out her course of Augmentin. She is scheduled to start chemotherapy next week and I think if she develops no redness or increased pain off of the antibiotics then she will be okay to start. I will plan to see her back in about 3 weeks to check her progress Current Plans Follow up in 3 weeks or as needed  Completed chemo. For port removal. Risks and benefits of the surgery discussed with the patient and she understands and wishes to proceed.  Signed by Luella Cook, MD

## 2015-06-01 NOTE — Anesthesia Preprocedure Evaluation (Signed)
Anesthesia Evaluation  Patient identified by MRN, date of birth, ID band Patient awake    Reviewed: Allergy & Precautions, H&P , NPO status , Patient's Chart, lab work & pertinent test results  Airway Mallampati: I  TM Distance: >3 FB Neck ROM: full    Dental no notable dental hx. (+) Teeth Intact   Pulmonary former smoker,    Pulmonary exam normal        Cardiovascular negative cardio ROS Normal cardiovascular exam     Neuro/Psych negative neurological ROS     GI/Hepatic negative GI ROS, Neg liver ROS,   Endo/Other  negative endocrine ROS  Renal/GU negative Renal ROS     Musculoskeletal   Abdominal Normal abdominal exam  (+)   Peds  Hematology negative hematology ROS (+)   Anesthesia Other Findings   Reproductive/Obstetrics negative OB ROS                             Anesthesia Physical Anesthesia Plan  ASA: II  Anesthesia Plan: MAC   Post-op Pain Management:    Induction: Intravenous  Airway Management Planned:   Additional Equipment:   Intra-op Plan:   Post-operative Plan:   Informed Consent: I have reviewed the patients History and Physical, chart, labs and discussed the procedure including the risks, benefits and alternatives for the proposed anesthesia with the patient or authorized representative who has indicated his/her understanding and acceptance.     Plan Discussed with: CRNA and Surgeon  Anesthesia Plan Comments:         Anesthesia Quick Evaluation

## 2015-06-01 NOTE — Op Note (Signed)
06/01/2015  4:00 PM  PATIENT:  Lori [name]  50 y.o. female  PRE-OPERATIVE DIAGNOSIS:  Breast cancer  POST-OPERATIVE DIAGNOSIS:  Breast cancer  PROCEDURE:  Procedure(s): REMOVAL PORT-A-CATH (Right)  SURGEON:  Surgeon(s) and Role:    * Jovita Kussmaul, MD - Primary  PHYSICIAN ASSISTANT:   ASSISTANTS: none   ANESTHESIA:   local and IV sedation  EBL:  Total I/O In: 700 [I.V.:700] Out: -   BLOOD ADMINISTERED:none  DRAINS: none   LOCAL MEDICATIONS USED:  MARCAINE    and LIDOCAINE   SPECIMEN:  No Specimen  DISPOSITION OF SPECIMEN:  N/A  COUNTS:  YES  TOURNIQUET:  * No tourniquets in log *  DICTATION: .Dragon Dictation   After informed consent was obtained the patient was brought to the operating room and placed in the supine position on the operating table. After IV sedation had been given the right chest area was prepped with ChloraPrep, allowed to dry, and draped in usual sterile manner. An appropriate timeout was performed. The area around the Port-A-Cath on the right chest wall was infiltrated with 1% lidocaine as well as quarter percent Marcaine until a good field block was created. A small incision was made with a 15 blade knife through her old incision. The incision was carried through the subcutaneous tissue sharply with the 15 blade knife until the port was identified. The capsule around the port was opened sharply with the 15 blade knife. The 2 anchoring stitches were identified and divided and removed. The port was then gently pushed out of its pocket and with gentle traction was removed from the patient without difficulty. Pressure was held for several minutes until the area was completely hemostatic. The deep layer of the wound was then closed with interrupted 3-0 Vicryl stitches. The skin was then closed with a running 4-0 Monocryl subcuticular stitch. Dermabond dressings were applied. The patient tolerated the procedure well. At the end of the case all needle sponge  and instrument counts were correct. The patient was then awakened and taken to recovery in stable condition.  PLAN OF CARE: Discharge to home after PACU  PATIENT DISPOSITION:  PACU - hemodynamically stable.   Delay start of Pharmacological VTE agent (>24hrs) due to surgical blood loss or risk of bleeding: not applicable

## 2015-06-01 NOTE — Interval H&P Note (Signed)
History and Physical Interval Note:  06/01/2015 2:08 PM  Lori [name]  has presented today for surgery, with the diagnosis of Breast cancer  The various methods of treatment have been discussed with the patient and family. After consideration of risks, benefits and other options for treatment, the patient has consented to  Procedure(s): REMOVAL PORT-A-CATH (N/A) as a surgical intervention .  The patient's history has been reviewed, patient examined, no change in status, stable for surgery.  I have reviewed the patient's chart and labs.  Questions were answered to the patient's satisfaction.     TOTH III,PAUL S

## 2015-06-02 ENCOUNTER — Encounter (HOSPITAL_BASED_OUTPATIENT_CLINIC_OR_DEPARTMENT_OTHER): Payer: Self-pay | Admitting: General Surgery

## 2015-06-02 ENCOUNTER — Encounter: Payer: Self-pay | Admitting: Oncology

## 2015-06-02 NOTE — Progress Notes (Signed)
Forms faxed 01/13/15 sent to medical records

## 2015-06-02 NOTE — Anesthesia Postprocedure Evaluation (Signed)
Anesthesia Post Note  Patient: Lori [name]  Procedure(s) Performed: Procedure(s) (LRB): REMOVAL PORT-A-CATH (Right)  Patient location during evaluation: PACU Anesthesia Type: MAC Level of consciousness: awake Pain management: pain level controlled Vital Signs Assessment: post-procedure vital signs reviewed and stable Respiratory status: spontaneous breathing Cardiovascular status: stable Postop Assessment: no signs of nausea or vomiting    Last Vitals:  Filed Vitals:   06/01/15 1645 06/01/15 1715  BP: 98/57 117/61  Pulse: 65 71  Temp:  36.6 C  Resp: 12 20    Last Pain:  Filed Vitals:   06/02/15 1119  PainSc: [place]

## 2015-06-08 ENCOUNTER — Encounter: Payer: Self-pay | Admitting: *Deleted

## 2015-06-16 ENCOUNTER — Ambulatory Visit
Admission: RE | Admit: 2015-06-16 | Discharge: 2015-06-16 | Disposition: A | Discharge status: Home / Self Care | Payer: BLUE CROSS/BLUE SHIELD | Source: Ambulatory Visit | Attending: Radiation Oncology | Admitting: Radiation Oncology

## 2015-06-16 ENCOUNTER — Encounter: Payer: Self-pay | Admitting: Radiation Oncology

## 2015-06-16 VITALS — BP 118/67 | HR 88 | Temp 97.9°F | Ht 68.0 in | Wt 230.8 lb

## 2015-06-16 DIAGNOSIS — C50412 Malignant neoplasm of upper-outer quadrant of left female breast: Secondary | ICD-10-CM

## 2015-06-16 NOTE — Progress Notes (Signed)
Radiation Oncology         [phone] ________________________________  Name: Lori [name] MRN: [id number]  Date: 06/16/2015  DOB: 01/24/1966  Follow-Up Visit Note  Outpatient  CC: [name], MD  Magrinat, Virgie Dad, MD  Diagnosis and Prior Radiotherapy:    ICD-9-CM ICD-10-CM   1. Breast cancer of upper-outer quadrant of left female breast ([place]) 174.4 C50.412     pT3N1a cM0 Stage III left breast invasive lobular carcinoma with LCIS, Grade 2, ER 50% PR 1% HER2 neg   Indication for treatment: Curative  Radiation treatment dates: 03/30/2015-05/10/2015  Site/dose: 1. Left chest wall and regional nodes / 50 Gy in 25 fractions to chest wall , 45 Gy in 25 fractions to SCV/PAB  2. Left chest wall scar boost / 10 Gy in 5 fractions  Narrative:  The patient returns today for routine follow-up.  [name] presents for follow up of radiation completed 05/10/15 to her Left Chest Wall. She reports some fatigue. She just started back to work this week, and is also attending the [employer] program at the [facility]. The skin over her radiation site has improved but remains slightly hyperpigmented. She has no other concerns at this time.  Applies aloe and coconut oil to chest wall.                               ALLERGIES:  has No Known Allergies.  Meds: Current Outpatient Prescriptions  Medication Sig Dispense Refill  . B Complex-C (B-COMPLEX WITH VITAMIN C) tablet Take 1 tablet by mouth daily.    . bisacodyl (DULCOLAX) 5 MG EC tablet Take 5 mg by mouth daily as needed for moderate constipation.    Marland Kitchen ibuprofen (ADVIL,MOTRIN) 200 MG tablet Take 200 mg by mouth every 6 (six) hours as needed.    . tamoxifen (NOLVADEX) 20 MG tablet Reported on 05/08/2015    . venlafaxine XR (EFFEXOR-XR) 75 MG 24 hr capsule Take 1 capsule (75 mg total) by mouth daily with breakfast. 90 capsule 3   No current facility-administered medications for this encounter.    Physical Findings: The patient is in no acute distress.  Patient is alert and oriented.  height is _0  (1.727 m) and weight is 230 lb 12.8 oz (104.69 kg). Her temperature is 97.9 F (36.6 C). Her blood pressure is 118/67 and her pulse is 88. Marland Kitchen  No palpable cervical, supraclavicular or axillary lymphoadenopathy. The heart has a regular rate and rhythm. The lungs are clear to auscultation. The left chest wall has healed well with slight residual erythema.  Lab Findings: Lab Results  Component Value Date   WBC 5.6 04/19/2015   HGB 14.1 04/19/2015   HCT 42.3 04/19/2015   MCV 86.1 04/19/2015   PLT 211 04/19/2015    Radiographic Findings: No results found.   Impression/Plan: I encouraged her to continue followup with medical oncology. I will see her back on an as-needed basis. I have encouraged her to call if she has any issues or concerns in the future. I wished her the very best  _____________________________________   [name], MD  This document serves as a record of services personally performed by [name], MD. It was created on her behalf by Derek Mound, a trained medical scribe. The creation of this record is based on the scribe's personal observations and the provider's statements to them. This document has been checked and approved by the attending provider.

## 2015-06-16 NOTE — Progress Notes (Signed)
Lori [name] presents for follow up of radiation completed 05/10/15 to her Left Chest Wall. She reports some fatigue. She just started back to work this week, and is also attending the [employer] program at the [facility]. The skin over her radiation site has improved but remains slightly hyperpigmented. She has no other concerns at this time.   BP 118/67 mmHg  Pulse 88  Temp(Src) 97.9 F (36.6 C)  Ht 5\' 8"  (1.727 m)  Wt 230 lb 12.8 oz (104.69 kg)  BMI 35.10 kg/m2

## 2015-06-29 ENCOUNTER — Ambulatory Visit (HOSPITAL_BASED_OUTPATIENT_CLINIC_OR_DEPARTMENT_OTHER): Payer: BLUE CROSS/BLUE SHIELD | Admitting: Nurse Practitioner

## 2015-06-29 ENCOUNTER — Encounter: Payer: Self-pay | Admitting: Nurse Practitioner

## 2015-06-29 VITALS — BP 111/70 | HR 81 | Temp 98.2°F | Resp 20 | Ht 68.0 in | Wt 228.4 lb

## 2015-06-29 DIAGNOSIS — Z170 Estrogen receptor positive status [ER+]: Secondary | ICD-10-CM | POA: Diagnosis not present

## 2015-06-29 DIAGNOSIS — C50412 Malignant neoplasm of upper-outer quadrant of left female breast: Secondary | ICD-10-CM | POA: Diagnosis not present

## 2015-06-29 DIAGNOSIS — Z79810 Long term (current) use of selective estrogen receptor modulators (SERMs): Secondary | ICD-10-CM | POA: Diagnosis not present

## 2015-06-29 NOTE — Progress Notes (Signed)
CLINIC:  Cancer Survivorship   REASON FOR VISIT:  Routine follow-up post-treatment for a recent history of breast cancer.  BRIEF ONCOLOGIC HISTORY:    Breast cancer of upper-outer quadrant of left female breast ([place])   06/23/2014 Mammogram Left breast: possible mass in the upper outer quadrant warranting further evaluation   07/04/2014 Initial Biopsy Breast, left, needle core biopsy, 1 o'clock: invasive and mammary carcinoma in situ; ER+ (50%), PR+ (1%), HER2/neu negative; Ki67 1%   07/08/2014 Breast MRI Large area of abnormal enhancement throughout the left breast. The more masslike component is seen in the upper-outer quadrant, associated with a biopsy clip and showing the most worrisome enhancement.   07/08/2014 Breast MRI Additional non mass stippled enhancement is identified throughout the other quadrants of the left breast suspicious for in situ disease. Additional biopsy can be performed to document extent of disease as needed if the patient is a candidate for breast   07/08/2014 Clinical Stage Stage IIA: T3 N0   07/13/2014 -  Neo-Adjuvant Anti-estrogen oral therapy Tamoxifen 20 mg; held during radiation   07/29/2014 Procedure OvaNext panel: no deleterious mutations at ATM, BARD1, BRCA1, BRCA2, BRIP1, CDH1, CHEK2, EPCAM, MLH1, MRE11A, MSH2, MSH6, MUTYH, NBN, NF1, PALB2, PMS2, PTEN, RAD50, RAD51C, RAD51D, SMARCA4, STK11, or TP53.     08/10/2014 Definitive Surgery Bilateral mastectomies: RIGHT: LCIS; LEFT: invasive lobular breast cancer, HER-2 repeated and again negative.  2 positive LN with 1 showing isolated tumor cells (total 12 LN)   08/10/2014 Pathologic Stage Stage IIIA: pT3 pN1   10/11/2014 - 03/07/2015 Chemotherapy Adjuvant dose dense doxorubicin and cyclophosphamide 4 followed by paclitaxel weekly 6, nab-paclitaxel x 1 as cycle #7 (tolerated poorly). Completed 5 additional doses of paclitaxel 03/07/2015   03/30/2015 - 05/10/2015 Radiation Therapy 1. Left chest wall and regional nodes / 50 Gy in 25  fractions to chest wall , 45 Gy in 25 fractions to SCV/PAB  2. Left chest wall scar boost / 10 Gy in 5 fractions    INTERVAL HISTORY:  Lori [name] presents to the [facility] today for our initial meeting to review her survivorship care plan detailing her treatment course for breast cancer, as well as monitoring long-term side effects of that treatment, education regarding health maintenance, screening, and overall wellness and health promotion.     Overall, Lori [name] reports feeling quite well since completing her radiation therapy approximately one and a half months ago. Her last chemotherapy was almost four months ago.  She continues with fatigue and reports that the skin changes overlying her left breast are improved.  She has returned to work and other than some "brain fog," it is going well.  She denies any mass or lesion in her breast.  She has resumed her tamoxifen and continues with ongoing hot flashes that are interfering with her sleep, but she states that they are bearable at this time. She is interested in potentially weaning off the venlafaxine in the future.  She has some increased anxiety now that she has finished treatment with some periodic episodes of crying, but she does not feel depressed.  She denies any headache, cough, shortness of breath or bone pain.  She has a good appetite and denies any weight loss.    REVIEW OF SYSTEMS:  General: Fatigue and hot flashes, as above. Denies fever, chills, unintentional weight loss, or night sweats.  HEENT: Denies visual changes, hearing loss, mouth sores or difficulty swallowing. Cardiac: Denies palpitations, chest pain, and lower extremity edema.  Respiratory: Denies  wheeze or dyspnea on exertion.  Breast: As above. GI: Denies abdominal pain, constipation, diarrhea, nausea, or vomiting.  GU: Denies dysuria, hematuria, vaginal bleeding, vaginal discharge, or vaginal dryness.  Musculoskeletal: Denies joint or bone pain.  Neuro:  Denies recent fall or numbness / tingling in her extremities. Skin: Denies rash, pruritis, or open wounds.  Psych: Anxiety and cognitive dysfunction, as above.  Denies depression  or memory loss.   A 14-point review of systems was completed and was negative, except as noted above.   ONCOLOGY TREATMENT TEAM:  1. Surgeon:  Dr. Marlou Starks at [facility]  2. Medical Oncologist: Dr. Jana Hakim 3. Radiation Oncologist: Dr. Isidore Moos    PAST MEDICAL/SURGICAL HISTORY:  Past Medical History  Diagnosis Date  . Arthritis     knees  . History of breast cancer 07/2014   Past Surgical History  Procedure Laterality Date  . Cesarean section  03/03/2002; 07/18/2010    x 2  . Knee arthroscopy Left 07/27/2013    Procedure: LEFT ARTHROSCOPY KNEE WITH MEDIAL MENISCAL DEBRIDEMENT;  Surgeon: Gearlean Alf, MD;  Location: [facility];  Service: Orthopedics;  Laterality: Left;  Marland Kitchen Mastectomy w/ sentinel node biopsy Bilateral 08/10/2014    Procedure: MASTECTOMY WITH SENTINEL LYMPH NODE BIOPSY;  Surgeon: [name], MD;  Location: [place];  Service: General;  Laterality: Bilateral;  . Portacath placement Right 09/07/2014    Procedure: INSERTION PORT-A-CATH;  Surgeon: [name], MD;  Location: [place];  Service: General;  Laterality: Right;  . Tubal ligation  07/18/2010  . Port-a-cath removal Right 06/01/2015    Procedure: REMOVAL PORT-A-CATH;  Surgeon: [name], MD;  Location: [facility] [place];  Service: General;  Laterality: Right;     ALLERGIES:  No Known Allergies   CURRENT MEDICATIONS:  Current Outpatient Prescriptions on File Prior to Visit  Medication Sig Dispense Refill  . B Complex-C (B-COMPLEX WITH VITAMIN C) tablet Take 1 tablet by mouth daily.    . bisacodyl (DULCOLAX) 5 MG EC tablet Take 5 mg by mouth daily as needed for moderate constipation.    Marland Kitchen ibuprofen (ADVIL,MOTRIN) 200 MG tablet Take 200 mg by mouth every 6 (six) hours as needed.    . tamoxifen  (NOLVADEX) 20 MG tablet Reported on 05/08/2015    . venlafaxine XR (EFFEXOR-XR) 75 MG 24 hr capsule Take 1 capsule (75 mg total) by mouth daily with breakfast. 90 capsule 3   No current facility-administered medications on file prior to visit.     ONCOLOGIC FAMILY HISTORY:  Family History  Problem Relation Age of Onset  . Breast cancer Mother 25  . Lymphoma Maternal Aunt 71  . Uterine cancer Paternal Aunt 27  . Uterine cancer Paternal Grandmother 82  . Ovarian cancer Other 80    mat great aunt through [facility]  . Ovarian cancer Maternal Grandmother 45     GENETIC COUNSELING/TESTING: Yes, performed 07/29/2014: OvaNext panel: no deleterious mutations at ATM, BARD1, BRCA1, BRCA2, BRIP1, CDH1, CHEK2, EPCAM, MLH1, MRE11A, MSH2, MSH6, MUTYH, NBN, NF1, PALB2, PMS2, PTEN, RAD50, RAD51C, RAD51D, SMARCA4, STK11, or TP53.     SOCIAL HISTORY:  Lori [name] is married and lives with her family in [place], [place].  She has 2 children. 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  She is a former smoker, having quit in 2002.  She denies any current or history of illicit drug use and uses alcohol socially.  PHYSICAL EXAMINATION:  Vital Signs: Filed Vitals:   06/29/15 1443  BP: 111/70  Pulse: 81  Temp: 98.2 F (36.8 C)  Resp: 20   ECOG Performance Status: 0  General: Well-nourished, well-appearing female in no acute distress.  She is unaccompanied in clinic today.   HEENT: Head is atraumatic and normocephalic.  Pupils equal and reactive to light and accomodation. Conjunctivae clear without exudate.  Sclerae anicteric. Oral mucosa is pink, moist, and intact without lesions.  Oropharynx is pink without lesions or erythema.  Lymph: No cervical, supraclavicular, infraclavicular, or axillary lymphadenopathy noted on palpation.  Cardiovascular: Regular rate and rhythm without murmurs, rubs, or gallops. Respiratory: Clear to auscultation bilaterally. Chest  expansion symmetric without accessory muscle use on inspiration or expiration.  GI: Abdomen soft and round. No tenderness to palpation. Bowel sounds normoactive in 4 quadrants. GU: Deferred. .   Neuro: No focal deficits. Steady gait.  Psych: Mood and affect normal and appropriate for situation.  Extremities: No edema, cyanosis, or clubbing.  Skin: Warm and dry. No open lesions noted.   LABORATORY DATA:  None for this visit.  DIAGNOSTIC IMAGING:  None for this visit.     ASSESSMENT AND PLAN:   1. Breast cancer: Stage IIIA invasive lobular carcinoma of the left breast (07/2014), grade 1, ER positive, PR positive, HER2/neu negative, S/P neoadjuvant tamoxifen begun 07/2014 with negative genetic testing, S/P bilateral mastectomy (right: LCIS) with 2/12 LN positive for malignancy, S/P adjuvant doxorubicin and cyclophosphamide x 4 cycles followed by one cycle of nab-paclitaxel followed by 11 cycles of paclitaxel (completed 03/2015) followed by adjuvant radiation therapy (completed 05/2015) with resumption of tamoxifen following completion of radiation.  Lori [name] is doing well without clinical symptoms worrisome for disease recurrence. She will follow-up with her medical oncologist,  Dr. Darnelle Catalan in May 2017 with history and physical examination per surveillance protocol. She will continue her anti-estrogen therapy with tamoxifen as prescribed by Dr.Magrinat at this time and report any new or increased side effects. We reviewed side effects of tamoxifen such as blood clots and endometrial cancer. She will report if her cognitive dysfunction and fatigue do not continue to improve. A comprehensive survivorship care plan and treatment summary was reviewed with the patient today detailing her breast cancer diagnosis, treatment course, potential late/long-term effects of treatment, appropriate follow-up care with recommendations for the future, and patient education resources.  A copy of this summary, along with  a letter will be sent to the patient's primary care provider via in basket message after today's visit.  Ms. Caywood is welcome to return to the Survivorship Clinic in the future, as needed; no follow-up will be scheduled at this time.    2. Hot flashes: We have discussed the nature and etiology of her hot flashes related to her tamoxifen.  I have advised her that I would not recommend titrating her venlafaxine off at this time secondary to the hot flashes as well as the increased anxiety that she is experiencing. I do think this would be possible at some time in the future and that the anxiety she is experiencing may be related to the timing of her recently having completed therapy.  She will report if it does not improve. We discussed additional agents, such as gabapentin, that could be helpful at decreasing her hot flashes She will consider this and discuss it further with Dr. Darnelle Catalan at her upcoming appointment next month.  3. Cancer screening:  Due to Ms. Dacey's history and her age, she  should receive screening for skin cancers, colon cancer (beginning at age 68), and gynecologic cancers.  The information and recommendations are listed on the patient's comprehensive care plan/treatment summary and were reviewed in detail with the patient.    4. Health maintenance and wellness promotion: Ms. Seaberry was encouraged to consume 5-7 servings of fruits and vegetables per day. We reviewed the "Nutrition Rainbow" handout, as well as discussed recommendations to maximize nutrition and minimize recurrence, such as increased intake of fruits, vegetables, lean proteins, and minimizing the intake of red meats and processed foods.  She was also encouraged to engage in moderate to vigorous exercise for 30 minutes per day most days of the week .She is currently participating in the [employer] program and enjoying it highly. She was instructed to limit her alcohol consumption and continue to abstain from  tobacco use..  A copy of the "Take Control of Your Health" brochure was given to her reinforcing these recommendations.   5. Support services/counseling: It is not uncommon for this period of the patient's cancer care trajectory to be one of many emotions and stressors.  She is currently participating in the [facility] support group series designed for patients after they have completed treatment that began this past week.  Ms. Toney was encouraged to take advantage of our many other support services programs, support groups, and/or counseling in coping with her new life as a cancer survivor after completing anti-cancer treatment.  She was offered support today through active listening and expressive supportive counseling.  She was given information regarding our available services and encouraged to contact me with any questions or for help enrolling in any of our support group/programs.    A total of 60 minutes of face-to-face time was spent with this patient with greater than 50% of that time in counseling and care-coordination.   Sylvan Cheese, NP  Survivorship Program [facility] [phone]   Note: PRIMARY CARE PROVIDER [name], [place] [phone]

## 2015-07-17 ENCOUNTER — Other Ambulatory Visit: Payer: Self-pay | Admitting: *Deleted

## 2015-07-17 DIAGNOSIS — C50412 Malignant neoplasm of upper-outer quadrant of left female breast: Secondary | ICD-10-CM

## 2015-07-17 DIAGNOSIS — K869 Disease of pancreas, unspecified: Secondary | ICD-10-CM

## 2015-07-18 ENCOUNTER — Ambulatory Visit (HOSPITAL_COMMUNITY)
Admission: RE | Admit: 2015-07-18 | Discharge: 2015-07-18 | Disposition: A | Discharge status: Home / Self Care | Payer: BLUE CROSS/BLUE SHIELD | Source: Ambulatory Visit | Attending: Oncology | Admitting: Oncology

## 2015-07-18 DIAGNOSIS — K862 Cyst of pancreas: Secondary | ICD-10-CM | POA: Diagnosis not present

## 2015-07-18 DIAGNOSIS — C50412 Malignant neoplasm of upper-outer quadrant of left female breast: Secondary | ICD-10-CM | POA: Diagnosis not present

## 2015-07-18 MED ORDER — GADOBENATE DIMEGLUMINE 529 MG/ML IV SOLN
20.0000 mL | Freq: Once | INTRAVENOUS | Status: AC | PRN
  Administered 2015-07-18: 20 mL via INTRAVENOUS

## 2015-07-24 ENCOUNTER — Other Ambulatory Visit: Payer: Self-pay | Admitting: Oncology

## 2015-07-24 NOTE — Progress Notes (Signed)
Electron Boost Treatment Planning Note 05-01-15 Diagnosis: Breast Cancer  The patient's CT images from her free-breathing simulation were reviewed to plan her boost treatment to her left breast chest wall scar.  The boost will be delivered with 6 MeV electrons, with an en face field, and custom electron cut out block. Special Port plan approved.  10 Gy in 5 fractions has been prescribed to the 94% isodose line. 0.5cm bolus daily will be used.  -----------------------------------  [name], MD

## 2015-07-26 ENCOUNTER — Ambulatory Visit (HOSPITAL_BASED_OUTPATIENT_CLINIC_OR_DEPARTMENT_OTHER): Payer: BLUE CROSS/BLUE SHIELD | Admitting: Oncology

## 2015-07-26 ENCOUNTER — Telehealth: Payer: Self-pay | Admitting: Oncology

## 2015-07-26 VITALS — BP 113/67 | HR 103 | Temp 98.8°F | Resp 18 | Ht 68.0 in | Wt 225.2 lb

## 2015-07-26 DIAGNOSIS — Z79811 Long term (current) use of aromatase inhibitors: Secondary | ICD-10-CM

## 2015-07-26 DIAGNOSIS — C50412 Malignant neoplasm of upper-outer quadrant of left female breast: Secondary | ICD-10-CM | POA: Diagnosis not present

## 2015-07-26 MED ORDER — VENLAFAXINE HCL 37.5 MG PO TABS: 37.5000 mg | ORAL_TABLET | Freq: Two times a day (BID) | ORAL | Status: DC

## 2015-07-26 MED ORDER — GABAPENTIN 300 MG PO CAPS: 300.0000 mg | ORAL_CAPSULE | Freq: Every day | ORAL | Status: DC

## 2015-07-26 NOTE — Progress Notes (Signed)
[place]  [phone] Fax:([phone]) [phone]   ID: Lori [name] DOB: 11/28/1965  MR#: [id number]  UOR#:[id number]  Patient Care Team: Lori Shan, MD as PCP - General (Family Medicine) Lori Messing III, MD as Consulting Physician (General Surgery) Lori Cruel, MD as Consulting Physician (Oncology) Lori Gibson, MD as Attending Physician (Radiation Oncology) [name], RN as Registered Nurse [name], RN as Registered Nurse [name], NP as Nurse Practitioner (Nurse Practitioner) Sylvan Cheese, NP as Nurse Practitioner (Hematology and Oncology) PCP: Lori Simmer, MD GYN: Lori Ache MD OTHER MD:  CHIEF COMPLAINT: Estrogen receptor positive breast cancer  CURRENT TREATMENT: Tamoxifen  BREAST CANCER HISTORY: From the original intake note:  Lori [name] (who is the daughter of my former patient, Lori [name], herself now 10 years out from her T1 cN0 invasive ductal carcinoma, treated with anti-estrogens only) [facility] April 20 03/23/2014. Breast density was category C. A possible mass in the left breast upper outer quadrant was felt to warrant further evaluation, and on 07/04/2014 the patient underwent left mammography with tomosynthesis and left breast ultrasonography. At this point the patient felt she was able to palpate a mass in the area in question. Tomosynthesis did reveal an irregular mass in the upper outer left breast measuring 2.3 cm, associated on physical exam with a large area of thickening. Ultrasound of the area in question confirmed an irregular mass at the 1:00 position 5 cm from the nipple measuring 2.2 cm. The left axilla was negative sonographically.  Biopsy of the mass in question the same day, 07/04/2014, showed (SAA [phone]) an invasive lobular breast cancer which was estrogen receptor 50% positive with strong staining intensity, progesterone receptor 1%  positive with moderate staining intensity, with a proliferation marker of 1%, and no HER-2 amplification, the signals ratio being 1.19 and the number per cell 1.55.  On 07/08/2014 the patient underwent bilateral breast MRI. This showed no abnormal adenopathy and no abnormality in the right breast. In the left breast however there was a 9.3 area of abnormal enhancement involving all quadrants. Within the upper outer quadrant there was a denser area which measured up to 5.1 cm.  The patient's subsequent history is as detailed below  INTERVAL HISTORY: Lori [name] returns today for follow-up of herestrogen receptor positive  breast cancer accompanied by her husband Lori [name]. she is back on tamoxifen and generally tolerating this well. She does have hot flashes and more important night sweats. These do keep her up. She has not have problems with vaginal wetness. She obtains a drug at a good price.  ROS: Lori [name] had significant trouble recovering from radiation. In particular his skin remained irritated for very long time and it was only when she started using a low that it cleared. She also was more tired than she expected. She is now back to work, but having difficulty concentrating. She can do only one thing at a time and slowly. She is sleeping poorly. She describes herself is moderately fatigued. She tried to go off the venlafaxine but became very dizzy and developed headaches. She would like to figure out how to get off that medication. She feels anxious but not depressed. A detailed review of systems today was otherwise stable.  PAST MEDICAL HISTORY: Past Medical History  Diagnosis Date  . Arthritis     knees  . History of breast cancer 07/2014    PAST SURGICAL HISTORY: Past Surgical History  Procedure Laterality Date  . Cesarean section  03/03/2002; 07/18/2010    x 2  . Knee arthroscopy Left 07/27/2013    Procedure: LEFT ARTHROSCOPY KNEE WITH MEDIAL MENISCAL DEBRIDEMENT;  Surgeon: Gearlean Alf, MD;   Location: [facility];  Service: Orthopedics;  Laterality: Left;  Marland Kitchen Mastectomy w/ sentinel node biopsy Bilateral 08/10/2014    Procedure: MASTECTOMY WITH SENTINEL LYMPH NODE BIOPSY;  Surgeon: Lori Messing III, MD;  Location: [place];  Service: General;  Laterality: Bilateral;  . Portacath placement Right 09/07/2014    Procedure: INSERTION PORT-A-CATH;  Surgeon: Lori Messing III, MD;  Location: [place];  Service: General;  Laterality: Right;  . Tubal ligation  07/18/2010  . Port-a-cath removal Right 06/01/2015    Procedure: REMOVAL PORT-A-CATH;  Surgeon: Lori Messing III, MD;  Location: [place];  Service: General;  Laterality: Right;    FAMILY HISTORY Family History  Problem Relation Age of Onset  . Breast cancer Mother 56  . Lymphoma Maternal Aunt 71  . Uterine cancer Paternal Aunt 23  . Uterine cancer Paternal Grandmother 23  . Ovarian cancer Other 35    mat great aunt through [facility]  . Ovarian cancer Maternal Grandmother 28  The patient's father died with congestive heart failure at the age of 55. The patient's mother is living, age 58. She was diagnosed with breast cancer at age 87. The patient's paternal grandmother was diagnosed with uterine cancer at age 34 and her daughter, the patient's paternal aunt, also with uterine cancer at age 4. On the mother's side one maternal aunt was diagnosed with lymphoma and the other with throat cancer. A maternal great aunt at age 58 was diagnosed with ovarian cancer.   GYNECOLOGIC HISTORY:  No LMP recorded.  menarche age 81, first live birth age 51, which the patient is aware double as the risk of breast cancer. She is GX P2. She still having regular periods. 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 Her husband Lori [name] the third (goes by "Lori [name]") works in Engineer, technical sales, although currently he is looking for work. Their children are Lori [name] 12 and Lori [name] 3. The patient is  not a church attender    ADVANCED DIRECTIVES: Not in place   HEALTH MAINTENANCE: Social History  Substance Use Topics  . Smoking status: Former Smoker -- 0.00 packs/day for 8 years    Quit date: 07/20/2000  . Smokeless tobacco: Never Used  . Alcohol Use: Yes     Comment: occasionally     Colonoscopy:  PAP:  Bone density:  Lipid panel:  No Known Allergies  Current Outpatient Prescriptions  Medication Sig Dispense Refill  . B Complex-C (B-COMPLEX WITH VITAMIN C) tablet Take 1 tablet by mouth daily.    . bisacodyl (DULCOLAX) 5 MG EC tablet Take 5 mg by mouth daily as needed for moderate constipation.    Marland Kitchen ibuprofen (ADVIL,MOTRIN) 200 MG tablet Take 200 mg by mouth every 6 (six) hours as needed.    . tamoxifen (NOLVADEX) 20 MG tablet Reported on 05/08/2015    . venlafaxine XR (EFFEXOR-XR) 75 MG 24 hr capsule Take 1 capsule (75 mg total) by mouth daily with breakfast. 90 capsule 3   No current facility-administered medications for this visit.    OBJECTIVE: young White woman Who appears well  Filed Vitals:   07/26/15 1442  BP: 113/67  Pulse: 103  Temp: 98.8 F (37.1 C)  Resp: 18     Body mass index is 34.25 kg/(m^2).      ECOG FS 1  Sclerae unicteric, EOMs intact Oropharynx clear, dentition in good repair No cervical or supraclavicular adenopathy Lungs no rales or rhonchi Heart regular rate and rhythm Abd soft, nontender, positive bowel sounds MSK no focal spinal tenderness, no upper extremity lymphedema Neuro: nonfocal, well oriented, appropriate affect Breasts: Status post bilateral mastectomies, status post left chest wall radiation. There is very minimal residual hyperpigmentation. There is no evidence of chest wall recurrence. Both axillae are benign.  LAB RESULTS:  INo results found for: SPEP, UPEP  CBC Latest Ref Rng 04/19/2015 03/07/2015 02/28/2015  WBC 3.9 - 10.3 10e3/uL 5.6 5.3 4.4  Hemoglobin 11.6 - 15.9 g/dL 14.1 13.0 12.1  Hematocrit 34.8 - 46.6 % 42.3  38.8 36.0  Platelets 145 - 400 10e3/uL 211 276 272   CMP Latest Ref Rng 04/19/2015 03/07/2015 02/28/2015  Glucose 70 - 140 mg/dl 93 103 100  BUN 7.0 - 26.0 mg/dL 12.3 14.0 7.2  Creatinine 0.6 - 1.1 mg/dL 0.7 0.7 0.7  Sodium 136 - 145 mEq/L 139 138 140  Potassium 3.5 - 5.1 mEq/L 4.3 4.3 4.4  CO2 22 - 29 mEq/L 24 24 26   Calcium 8.4 - 10.4 mg/dL 9.6 9.5 9.3  Total Protein 6.4 - 8.3 g/dL 7.3 7.2 6.7  Total Bilirubin 0.20 - 1.20 mg/dL 0.39 0.34 0.33  Alkaline Phos 40 - 150 U/L 91 89 98  AST 5 - 34 U/L 22 22 31   ALT 0 - 55 U/L 25 26 44     STUDIES: Mr Abdomen W Wo Contrast  07/18/2015  CLINICAL DATA:  Left-sided breast cancer. Cystic lesion within the pancreatic tail on CT. EXAM: MRI ABDOMEN WITHOUT AND WITH CONTRAST TECHNIQUE: Multiplanar multisequence MR imaging of the abdomen was performed both before and after the administration of intravenous contrast. CONTRAST:  8m MULTIHANCE GADOBENATE DIMEGLUMINE 529 MG/ML IV SOLN COMPARISON:  CT 08/31/2014. FINDINGS: Lower chest: Normal heart size without pericardial or pleural effusion. Hepatobiliary: Normal liver. Normal gallbladder, without biliary ductal dilatation. Pancreas: Simple appearing cystic lesion within the pancreatic tail. This measures 14 mm on transverse image 25/series 3. Enlarged from 5 mm on the prior. No solid components identified. There is mild peripheral enhancement, most apparent on subtracted image 44/ series 11002. No main pancreatic duct dilatation or acute pancreatitis. Spleen: Normal in size, without focal abnormality. Adrenals/Urinary Tract: Normal adrenal glands. Normal left kidney. Tiny right renal lesions are likely cysts but are too small to characterize. No hydronephrosis. Stomach/Bowel: Normal stomach and abdominal bowel loops. Vascular/Lymphatic: Normal caliber of the aorta and branch vessels. No retroperitoneal or retrocrural adenopathy. Other: No ascites. Musculoskeletal: No acute osseous abnormality. IMPRESSION: 1.  Interval enlargement of a simple appearing cystic lesion within the pancreatic tail. This is again most likely a pseudocyst. Indolent cystic neoplasm could look similar. Especially given the interval enlargement, surveillance with MRI at 6-12 months is suggested. Alternatively, if the patient is scheduled for follow-up routine CTs, these should include dedicated pancreatic protocol technique. 2. No acute process or evidence of metastatic disease in the abdomen. Electronically Signed   By: KAbigail MiyamotoM.D.   On: 07/18/2015 09:19    ASSESSMENT: 50y.o. BRCA negative [place] woman status post left breast biopsy 07/04/2014 for a clinical T3 N0, stage IIA invasive lobular breast cancer, grade 2, estrogen receptor positive, progesterone receptor 1% "positive", with an MIB-1 of less than 5% and no HER-2 amplification  (  1) genetics testing 07/29/2014 through the [place] gene panel offered by [facility] found no deleterious mutations in ATM, BARD1, BRCA1, BRCA2, BRIP1, CDH1, CHEK2, EPCAM, MLH1, MRE11A, MSH2, MSH6, MUTYH, NBN, NF1, PALB2, PMS2, PTEN, RAD50, RAD51C, RAD51D, SMARCA4, STK11, or TP53.   (2) status post bilateral mastectomies 08/10/2014 showing  (a) on the right, lobular carcinoma in situ  (b) on the left, a pT3 pN1, stage IIIA invasive lobular breast cancer, HER-2 repeated and again negative  (3) chemotherapy started 10/11/2014,consisting of doxorubicin and cyclophosphamide in dose dense fashion 4, completed 11/22/2014, followed by paclitaxel weekly 6 started 12/06/2014. Abraxane given once, as cycle #7,and tolerated poorly. Completed 5 additional doses of paclitaxel 03/07/2015  (4) radiation completed 05/10/2015  (5) tamoxifen was started neoadjuvantly on 07/13/2014, was held during chemotherapy, was resumed   mid-March 2017  (6) 5 mm cystic lesion in the tail of the pancreas noted by CT scan of the abdomen and pelvis 08/31/2014  (a) repeat abdominal MRI 07/18/2014 show some growth  of the lesion, to 1.4 cm.   PLAN: Simona is now just about a year out from definitive surgery for her breast cancer, with no evidence of disease recurrence. This is very favorable.   She is tolerating tamoxifen well, and aside from the hot flashes. She would like to get off the venlafaxine, which she does not feel it is helping.  We gave her a taper, specifically changing from the current 75 mg HR daily to 37.5 mg non-HR twice a day (this is The current dose). She will do this for a week and if everything continues well she will go to once a day for week and then every other day for 3 doses.  However if this taper proves to be to fast, she will go to 2 tablets twice daily (which is essentially her current dose) and then after a week drop one of the evening tablet, a week later drop one of the morning tablets on the then a week later drop the evening tablet, and then finally stop. In any case she will call us if she has any problems regarding this.  She is interested in a second opinion regarding reconstruction and I gave her 3 names to consider.  We also discussed the cystic pancreatic lesion. She understands his really needs to be followed by the same time we don't want to jump to any kind of surgery, which would be very extensive and could have significant quality of life residuals. We are going to repeat an MRI of the abdomen in 6 months. At the same time she understands as far as her breast cancer is concerned we generally do not recommend staging studies in the absence of specific symptoms to evaluate.  I think she would benefit from gabapentin for her night sweats. I wrote that prescription for her but she is very concerned with taking any medication and may not want to start it. She is very concerned about cognitive dysfunction but she is back to work and able to do it so far. I thought if she can pacer self and not let herself get overwhelmed, she will probably work out of it after a few  months.   Otherwise she will see me again in 3 months. If all is stable at that time we may start six-month follow-up from that point    Lori Cruel, MD   07/26/2015 3:17 PM

## 2015-07-26 NOTE — Telephone Encounter (Signed)
appt made and avs printed °

## 2015-08-01 ENCOUNTER — Other Ambulatory Visit: Payer: Self-pay | Admitting: *Deleted

## 2015-08-01 MED ORDER — TAMOXIFEN CITRATE 20 MG PO TABS: 20.0000 mg | ORAL_TABLET | Freq: Every day | ORAL | Status: DC

## 2015-08-23 ENCOUNTER — Other Ambulatory Visit: Payer: Self-pay | Admitting: *Deleted

## 2015-08-23 MED ORDER — TAMOXIFEN CITRATE 20 MG PO TABS: 20.0000 mg | ORAL_TABLET | Freq: Every day | ORAL | Status: DC

## 2015-10-16 ENCOUNTER — Other Ambulatory Visit: Payer: BLUE CROSS/BLUE SHIELD

## 2015-10-23 ENCOUNTER — Ambulatory Visit: Payer: BLUE CROSS/BLUE SHIELD | Admitting: Oncology

## 2015-10-30 ENCOUNTER — Other Ambulatory Visit: Payer: Self-pay | Admitting: *Deleted

## 2015-10-30 DIAGNOSIS — C50412 Malignant neoplasm of upper-outer quadrant of left female breast: Secondary | ICD-10-CM

## 2015-10-31 ENCOUNTER — Other Ambulatory Visit (HOSPITAL_BASED_OUTPATIENT_CLINIC_OR_DEPARTMENT_OTHER): Payer: BLUE CROSS/BLUE SHIELD

## 2015-10-31 DIAGNOSIS — C50412 Malignant neoplasm of upper-outer quadrant of left female breast: Secondary | ICD-10-CM | POA: Diagnosis not present

## 2015-10-31 LAB — COMPREHENSIVE METABOLIC PANEL
ALK PHOS: 90 U/L (ref 40–150)
ALT: 55 U/L (ref 0–55)
ANION GAP: 10 meq/L (ref 3–11)
AST: 28 U/L (ref 5–34)
Albumin: 3.4 g/dL — ABNORMAL LOW (ref 3.5–5.0)
BILIRUBIN TOTAL: 0.38 mg/dL (ref 0.20–1.20)
BUN: 13.2 mg/dL (ref 7.0–26.0)
CO2: 21 mEq/L — ABNORMAL LOW (ref 22–29)
CREATININE: 0.7 mg/dL (ref 0.6–1.1)
Calcium: 9.5 mg/dL (ref 8.4–10.4)
Chloride: 110 mEq/L — ABNORMAL HIGH (ref 98–109)
EGFR: >90 mL/min/{1.73_m2} (ref >90)
GLUCOSE: 121 mg/dL (ref 70–140)
Potassium: 4.4 mEq/L (ref 3.5–5.1)
Sodium: 141 mEq/L (ref 136–145)
TOTAL PROTEIN: 6.9 g/dL (ref 6.4–8.3)

## 2015-10-31 LAB — CBC WITH DIFFERENTIAL/PLATELET
BASO%: 1.2 % (ref 0.0–2.0)
Basophils Absolute: 0.1 10*3/uL (ref 0.0–0.1)
EOS%: 2.7 % (ref 0.0–7.0)
Eosinophils Absolute: 0.1 10*3/uL (ref 0.0–0.5)
HEMATOCRIT: 40.7 % (ref 34.8–46.6)
HEMOGLOBIN: 13.6 g/dL (ref 11.6–15.9)
LYMPH#: 1.1 10*3/uL (ref 0.9–3.3)
LYMPH%: 20.4 % (ref 14.0–49.7)
MCH: 29.1 pg (ref 25.1–34.0)
MCHC: 33.4 g/dL (ref 31.5–36.0)
MCV: 87 fL (ref 79.5–101.0)
MONO#: 0.4 10*3/uL (ref 0.1–0.9)
MONO%: 7.4 % (ref 0.0–14.0)
NEUT%: 68.3 % (ref 38.4–76.8)
NEUTROS ABS: 3.6 10*3/uL (ref 1.5–6.5)
PLATELETS: 210 10*3/uL (ref 145–400)
RBC: 4.67 10*6/uL (ref 3.70–5.45)
RDW: 13.1 % (ref 11.2–14.5)
WBC: 5.2 10*3/uL (ref 3.9–10.3)

## 2015-11-06 NOTE — Progress Notes (Signed)
[place]  Telephone:([phone]) [phone] Fax:(336) [phone]   ID: Lori [name] DOB: 09/28/1965  MR#: [id number]  TTS#:[id number]  Patient Care Team: Lori Shan, MD as PCP - General (Family Medicine) Lori Messing III, MD as Consulting Physician (General Surgery) Lori Cruel, MD as Consulting Physician (Oncology) Lori Gibson, MD as Attending Physician (Radiation Oncology) Lori Germany, RN as Registered Nurse Lori Kaufmann, RN as Registered Nurse Lori Bouche, NP as Nurse Practitioner (Nurse Practitioner) Lori Cheese, NP as Nurse Practitioner (Hematology and Oncology) PCP: Lori Simmer, MD GYN: Lori Ache MD OTHER MD:  CHIEF COMPLAINT: Estrogen receptor positive breast cancer  CURRENT TREATMENT: Tamoxifen  BREAST CANCER HISTORY: From the original intake note:  Lori [name] (who is the daughter of my former patient, Lori [name], herself now 10 years out from her T1 cN0 invasive ductal carcinoma, treated with anti-estrogens only) [facility] April 20 03/23/2014. Breast density was category C. A possible mass in the left breast upper outer quadrant was felt to warrant further evaluation, and on 07/04/2014 the patient underwent left mammography with tomosynthesis and left breast ultrasonography. At this point the patient felt she was able to palpate a mass in the area in question. Tomosynthesis did reveal an irregular mass in the upper outer left breast measuring 2.3 cm, associated on physical exam with a large area of thickening. Ultrasound of the area in question confirmed an irregular mass at the 1:00 position 5 cm from the nipple measuring 2.2 cm. The left axilla was negative sonographically.  Biopsy of the mass in question the same day, 07/04/2014, showed (SAA [id number]) an invasive lobular breast cancer which was estrogen receptor 50% positive with strong staining intensity, progesterone receptor 1%  positive with moderate staining intensity, with a proliferation marker of 1%, and no HER-2 amplification, the signals ratio being 1.19 and the number per cell 1.55.  On 07/08/2014 the patient underwent bilateral breast MRI. This showed no abnormal adenopathy and no abnormality in the right breast. In the left breast however there was a 9.3 area of abnormal enhancement involving all quadrants. Within the upper outer quadrant there was a denser area which measured up to 5.1 cm.  The patient's subsequent history is as detailed below  INTERVAL HISTORY: Lori [name] returns today for follow-up of her estrogen receptor positive breast cancer. She continues on tamoxifen, with significant hot flashes as her only side effect. She does not have any vaginal wetness. She is not having any periods. She obtains a drug at a good price.   ROS: Lori [name] finally went off the venlafaxine but it was a struggle. Took her about 7 or 8 weeks. It caused her nausea, and brain flutter, dizziness, and he was just "awful". Even now, 5 weeks off the medication she still occasionally feels a little dizzy. The hot flashes do wake her up at night but she was not able to tolerate the gabapentin. She actually feels she sleeps plenty although not continuously. For exercise she just got a stationary bike. A detailed review of systems today was otherwise benign.  PAST MEDICAL HISTORY: Past Medical History:  Diagnosis Date  . Arthritis    knees  . History of breast cancer 07/2014    PAST SURGICAL HISTORY: Past Surgical History:  Procedure Laterality Date  . CESAREAN SECTION  03/03/2002; 07/18/2010   x 2  . KNEE ARTHROSCOPY Left 07/27/2013   Procedure: LEFT ARTHROSCOPY KNEE WITH MEDIAL MENISCAL DEBRIDEMENT;  Surgeon: Lori [name]  Lori Ball, MD;  Location: [facility];  Service: Orthopedics;  Laterality: Left;  Marland Kitchen MASTECTOMY W/ SENTINEL NODE BIOPSY Bilateral 08/10/2014   Procedure: MASTECTOMY WITH SENTINEL LYMPH NODE BIOPSY;  Surgeon: Lori Messing III, MD;   Location: [facility];  Service: General;  Laterality: Bilateral;  . PORT-A-CATH REMOVAL Right 06/01/2015   Procedure: REMOVAL PORT-A-CATH;  Surgeon: Lori Messing III, MD;  Location: [facility];  Service: General;  Laterality: Right;  . PORTACATH PLACEMENT Right 09/07/2014   Procedure: INSERTION PORT-A-CATH;  Surgeon: Lori Messing III, MD;  Location: [place];  Service: General;  Laterality: Right;  . TUBAL LIGATION  07/18/2010    FAMILY HISTORY Family History  Problem Relation Age of Onset  . Breast cancer Mother 83  . Lymphoma Maternal Aunt 71  . Uterine cancer Paternal Aunt 46  . Uterine cancer Paternal Grandmother 62  . Ovarian cancer Other 39    mat great aunt through [facility]  . Ovarian cancer Maternal Grandmother 69  The patient's father died with congestive heart failure at the age of 4. The patient's mother is living, age 97. She was diagnosed with breast cancer at age 22. The patient's paternal grandmother was diagnosed with uterine cancer at age 72 and her daughter, the patient's paternal aunt, also with uterine cancer at age 59. On the mother's side one maternal aunt was diagnosed with lymphoma and the other with throat cancer. A maternal great aunt at age 83 was diagnosed with ovarian cancer.   GYNECOLOGIC HISTORY:  No LMP recorded.  menarche age 15, first live birth age 69, which the patient is aware double as the risk of breast cancer. She is GX P2. She still having regular periods. 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 Her husband Lori [name] the third (goes by "Lori [name]") works in Engineer, technical sales, although currently he is looking for work. Their children are Lori [name] 12 and Lori [name] 3. The patient is not a church attender    ADVANCED DIRECTIVES: Not in place   HEALTH MAINTENANCE: Social History  Substance Use Topics  . Smoking status: Former Smoker    Packs/day: 0.00    Years: 8.00    Quit date:  07/20/2000  . Smokeless tobacco: Never Used  . Alcohol use Yes     Comment: occasionally     Colonoscopy:  PAP:  Bone density:  Lipid panel:  No Known Allergies  Current Outpatient Prescriptions  Medication Sig Dispense Refill  . ibuprofen (ADVIL,MOTRIN) 200 MG tablet Take 200 mg by mouth every 6 (six) hours as needed.    . tamoxifen (NOLVADEX) 20 MG tablet Take 1 tablet (20 mg total) by mouth daily. Reported on 05/08/2015 90 tablet 3   No current facility-administered medications for this visit.     OBJECTIVE: young White woman In no acute distress Vitals:   11/07/15 0923  BP: 117/78  Pulse: 98  Resp: 18  Temp: 98.4 F (36.9 C)     Body mass index is 35.37 kg/m.      ECOG FS 0  Sclerae unicteric, pupils round and equal Oropharynx clear and moist-- no thrush or other lesions No cervical or supraclavicular adenopathy Lungs no rales or rhonchi Heart regular rate and rhythm Abd soft, nontender, positive bowel sounds MSK no focal spinal tenderness, no upper extremity lymphedema Neuro: nonfocal, well oriented, appropriate affect Breasts: Status post bilateral mastectomies. There is no evidence of chest wall recurrence.  Both axillae are benign.    LAB RESULTS:  INo results found for: SPEP, UPEP  CBC Latest Ref Rng & Units 10/31/2015 04/19/2015 03/07/2015  WBC 3.9 - 10.3 10e3/uL 5.2 5.6 5.3  Hemoglobin 11.6 - 15.9 g/dL 13.6 14.1 13.0  Hematocrit 34.8 - 46.6 % 40.7 42.3 38.8  Platelets 145 - 400 10e3/uL 210 211 276   CMP Latest Ref Rng & Units 10/31/2015 04/19/2015 03/07/2015  Glucose 70 - 140 mg/dl 121 93 103  BUN 7.0 - 26.0 mg/dL 13.2 12.3 14.0  Creatinine 0.6 - 1.1 mg/dL 0.7 0.7 0.7  Sodium 136 - 145 mEq/L 141 139 138  Potassium 3.5 - 5.1 mEq/L 4.4 4.3 4.3  Chloride 101 - 111 mmol/L - - -  CO2 22 - 29 mEq/L 21(L) 24 24  Calcium 8.4 - 10.4 mg/dL 9.5 9.6 9.5  Total Protein 6.4 - 8.3 g/dL 6.9 7.3 7.2  Total Bilirubin 0.20 - 1.20 mg/dL 0.38 0.39 0.34  Alkaline Phos 40 -  150 U/L 90 91 89  AST 5 - 34 U/L 28 22 22   ALT 0 - 55 U/L 55 25 26     STUDIES: CLINICAL DATA:  Left-sided breast cancer. Cystic lesion within the pancreatic tail on CT.  EXAM: MRI ABDOMEN WITHOUT AND WITH CONTRAST  TECHNIQUE: Multiplanar multisequence MR imaging of the abdomen was performed both before and after the administration of intravenous contrast.  CONTRAST:  2m MULTIHANCE GADOBENATE DIMEGLUMINE 529 MG/ML IV SOLN  COMPARISON:  CT 08/31/2014.  FINDINGS: Lower chest: Normal heart size without pericardial or pleural effusion.  Hepatobiliary: Normal liver. Normal gallbladder, without biliary ductal dilatation.  Pancreas: Simple appearing cystic lesion within the pancreatic tail. This measures 14 mm on transverse image 25/series 3. Enlarged from 5 mm on the prior. No solid components identified. There is mild peripheral enhancement, most apparent on subtracted image 44/ series 11002. No main pancreatic duct dilatation or acute pancreatitis.  Spleen: Normal in size, without focal abnormality.  Adrenals/Urinary Tract: Normal adrenal glands. Normal left kidney. Tiny right renal lesions are likely cysts but are too small to characterize. No hydronephrosis.  Stomach/Bowel: Normal stomach and abdominal bowel loops.  Vascular/Lymphatic: Normal caliber of the aorta and branch vessels. No retroperitoneal or retrocrural adenopathy.  Other: No ascites.  Musculoskeletal: No acute osseous abnormality.  IMPRESSION: 1. Interval enlargement of a simple appearing cystic lesion within the pancreatic tail. This is again most likely a pseudocyst. Indolent cystic neoplasm could look similar. Especially given the interval enlargement, surveillance with MRI at 6-12 months is suggested. Alternatively, if the patient is scheduled for follow-up routine CTs, these should include dedicated pancreatic protocol technique. 2. No acute process or evidence of metastatic  disease in the abdomen.   Electronically Signed   By: KAbigail MiyamotoM.D.   On: 07/18/2015 09:19 Mr. ASSESSMENT: 50y.o. BRCA negative [place] woman status post left breast Upper outer quadrant biopsy 07/04/2014 for a clinical T3 N0, stage IIA invasive lobular breast cancer, grade 2, estrogen receptor positive, progesterone receptor 1% "positive", with an MIB-1 of less than 5% and no HER-2 amplification  (1) genetics testing 07/29/2014 through the [employer] gene panel offered by [facility] Homesfound no deleterious mutations in ATM, BARD1, BRCA1, BRCA2, BRIP1, CDH1, CHEK2, EPCAM, MLH1, MRE11A, MSH2, MSH6, MUTYH, NBN, NF1, PALB2, PMS2, PTEN, RAD50, RAD51C, RAD51D, SMARCA4, STK11, or TP53.   (2) status post bilateral mastectomies 08/10/2014 showing  (a) on the right, lobular carcinoma in situ  (b) on the left, a pT3  pN1, stage IIIA invasive lobular breast cancer, HER-2 repeated and again negative  (3) chemotherapy started 10/11/2014,consisting of doxorubicin and cyclophosphamide in dose dense fashion 4, completed 11/22/2014, followed by paclitaxel weekly 6 started 12/06/2014. Abraxane given once, as cycle #7,and tolerated poorly. Completed 5 additional doses of paclitaxel 03/07/2015  (4) radiation completed 05/10/2015  (5) tamoxifen was started neoadjuvantly on 07/13/2014, was held during chemotherapy, was resumed   mid-March 2017  (6) 5 mm cystic lesion in the tail of the pancreas noted by CT scan of the abdomen and pelvis 08/31/2014  (a) repeat abdominal MRI 07/18/2015 show some growth of the lesion, to 1.4 cm. (c/w CT scan)   PLAN: [place] is now a little over a year out from definitive surgery for her breast cancer with no evidence of disease recurrence. This is favorable.  We reviewed the MRI of her abdomen which showed the cystic pancreatic lesion to have grown. However it is being compared to a prior CT scan. We are going to get a better idea when we repeat the MRI in November.  That will be comparing an MRI to an MRI. If there is further growth I will refer her to surgery at that point for discussion of possible interventions  She has done a very good job of getting off most of her supportive medicines. The plan is to continue tamoxifen at least another year before considering switching to aromatase inhibitors  She is considering flap reconstruction at some point next year.  She knows to call for any problems that may develop before her next visit here. Lori Cruel, MD   11/07/2015 9:35 AM

## 2015-11-07 ENCOUNTER — Encounter: Payer: Self-pay | Admitting: *Deleted

## 2015-11-07 ENCOUNTER — Telehealth: Payer: Self-pay | Admitting: Oncology

## 2015-11-07 ENCOUNTER — Ambulatory Visit (HOSPITAL_BASED_OUTPATIENT_CLINIC_OR_DEPARTMENT_OTHER): Payer: BLUE CROSS/BLUE SHIELD | Admitting: Oncology

## 2015-11-07 VITALS — BP 117/78 | HR 98 | Temp 98.4°F | Resp 18 | Ht 68.0 in | Wt 232.6 lb

## 2015-11-07 DIAGNOSIS — Z79810 Long term (current) use of selective estrogen receptor modulators (SERMs): Secondary | ICD-10-CM

## 2015-11-07 DIAGNOSIS — C50412 Malignant neoplasm of upper-outer quadrant of left female breast: Secondary | ICD-10-CM

## 2015-11-07 DIAGNOSIS — K869 Disease of pancreas, unspecified: Secondary | ICD-10-CM

## 2015-11-07 DIAGNOSIS — Z170 Estrogen receptor positive status [ER+]: Secondary | ICD-10-CM

## 2015-11-07 MED ORDER — TAMOXIFEN CITRATE 20 MG PO TABS: 20.0000 mg | ORAL_TABLET | Freq: Every day | ORAL | 3 refills | Status: DC

## 2015-11-07 NOTE — Telephone Encounter (Signed)
appt made per LOS and letter sent by mail

## 2015-11-29 ENCOUNTER — Encounter: Payer: Self-pay | Admitting: Oncology

## 2016-01-30 ENCOUNTER — Other Ambulatory Visit: Payer: Self-pay | Admitting: Oncology

## 2016-01-30 ENCOUNTER — Other Ambulatory Visit (HOSPITAL_BASED_OUTPATIENT_CLINIC_OR_DEPARTMENT_OTHER): Payer: BLUE CROSS/BLUE SHIELD

## 2016-01-30 DIAGNOSIS — C50412 Malignant neoplasm of upper-outer quadrant of left female breast: Secondary | ICD-10-CM

## 2016-01-30 LAB — COMPREHENSIVE METABOLIC PANEL
ALT: 38 U/L (ref 0–55)
AST: 20 U/L (ref 5–34)
Albumin: 3.3 g/dL — ABNORMAL LOW (ref 3.5–5.0)
Alkaline Phosphatase: 102 U/L (ref 40–150)
Anion Gap: 9 mEq/L (ref 3–11)
BUN: 15.4 mg/dL (ref 7.0–26.0)
CALCIUM: 9.4 mg/dL (ref 8.4–10.4)
CHLORIDE: 108 meq/L (ref 98–109)
CO2: 22 mEq/L (ref 22–29)
Creatinine: 0.7 mg/dL (ref 0.6–1.1)
EGFR: >90 mL/min/{1.73_m2} (ref >90)
Glucose: 102 mg/dl (ref 70–140)
POTASSIUM: 4.3 meq/L (ref 3.5–5.1)
Sodium: 139 mEq/L (ref 136–145)
Total Bilirubin: 0.34 mg/dL (ref 0.20–1.20)
Total Protein: 6.9 g/dL (ref 6.4–8.3)

## 2016-01-30 LAB — CBC WITH DIFFERENTIAL/PLATELET
BASO%: 0.9 % (ref 0.0–2.0)
BASOS ABS: 0.1 10*3/uL (ref 0.0–0.1)
EOS%: 3.1 % (ref 0.0–7.0)
Eosinophils Absolute: 0.2 10*3/uL (ref 0.0–0.5)
HEMATOCRIT: 40 % (ref 34.8–46.6)
HGB: 13.6 g/dL (ref 11.6–15.9)
LYMPH%: 23.7 % (ref 14.0–49.7)
MCH: 29.2 pg (ref 25.1–34.0)
MCHC: 34 g/dL (ref 31.5–36.0)
MCV: 85.8 fL (ref 79.5–101.0)
MONO#: 0.4 10*3/uL (ref 0.1–0.9)
MONO%: 7.6 % (ref 0.0–14.0)
NEUT#: 3.7 10*3/uL (ref 1.5–6.5)
NEUT%: 64.7 % (ref 38.4–76.8)
Platelets: 229 10*3/uL (ref 145–400)
RBC: 4.66 10*6/uL (ref 3.70–5.45)
RDW: 12.7 % (ref 11.2–14.5)
WBC: 5.8 10*3/uL (ref 3.9–10.3)
lymph#: 1.4 10*3/uL (ref 0.9–3.3)

## 2016-01-31 ENCOUNTER — Ambulatory Visit (HOSPITAL_COMMUNITY)
Admission: RE | Admit: 2016-01-31 | Discharge: 2016-01-31 | Disposition: A | Discharge status: Home / Self Care | Payer: BLUE CROSS/BLUE SHIELD | Source: Ambulatory Visit | Attending: Oncology | Admitting: Oncology

## 2016-01-31 ENCOUNTER — Other Ambulatory Visit: Payer: Self-pay | Admitting: Oncology

## 2016-01-31 ENCOUNTER — Telehealth: Payer: Self-pay | Admitting: *Deleted

## 2016-01-31 ENCOUNTER — Other Ambulatory Visit: Payer: Self-pay | Admitting: *Deleted

## 2016-01-31 DIAGNOSIS — C50412 Malignant neoplasm of upper-outer quadrant of left female breast: Secondary | ICD-10-CM | POA: Diagnosis not present

## 2016-01-31 DIAGNOSIS — R9389 Abnormal findings on diagnostic imaging of other specified body structures: Secondary | ICD-10-CM

## 2016-01-31 DIAGNOSIS — K862 Cyst of pancreas: Secondary | ICD-10-CM | POA: Diagnosis not present

## 2016-01-31 DIAGNOSIS — R938 Abnormal findings on diagnostic imaging of other specified body structures: Secondary | ICD-10-CM | POA: Diagnosis present

## 2016-01-31 LAB — FOLLICLE STIMULATING HORMONE: FSH: 25.3 m[IU]/mL

## 2016-01-31 MED ORDER — GADOBENATE DIMEGLUMINE 529 MG/ML IV SOLN
20.0000 mL | Freq: Once | INTRAVENOUS | Status: AC | PRN
  Administered 2016-01-31: 20 mL via INTRAVENOUS

## 2016-01-31 NOTE — Telephone Encounter (Addendum)
Received call @ [facility] called needing Dr. Jana Hakim to E-Sign for MRI. MRI is scheduled @ 1000

## 2016-01-31 NOTE — Telephone Encounter (Signed)
Request given to MD upon call

## 2016-02-02 LAB — ESTRADIOL, ULTRA SENS: Estradiol, Sensitive: 5.6 pg/mL

## 2016-02-06 ENCOUNTER — Ambulatory Visit (HOSPITAL_BASED_OUTPATIENT_CLINIC_OR_DEPARTMENT_OTHER): Payer: BLUE CROSS/BLUE SHIELD | Admitting: Oncology

## 2016-02-06 VITALS — BP 116/70 | HR 86 | Temp 97.6°F | Resp 18 | Ht 68.0 in | Wt 239.5 lb

## 2016-02-06 DIAGNOSIS — Z79810 Long term (current) use of selective estrogen receptor modulators (SERMs): Secondary | ICD-10-CM

## 2016-02-06 DIAGNOSIS — C50412 Malignant neoplasm of upper-outer quadrant of left female breast: Secondary | ICD-10-CM | POA: Diagnosis not present

## 2016-02-06 DIAGNOSIS — Z170 Estrogen receptor positive status [ER+]: Secondary | ICD-10-CM

## 2016-02-06 MED ORDER — GABAPENTIN 300 MG PO CAPS: 300.0000 mg | ORAL_CAPSULE | Freq: Every day | ORAL | 4 refills | Status: DC

## 2016-02-06 MED ORDER — TAMOXIFEN CITRATE 20 MG PO TABS: 20.0000 mg | ORAL_TABLET | Freq: Every day | ORAL | 3 refills | Status: DC

## 2016-02-06 NOTE — Progress Notes (Signed)
[facility]  Telephone:([phone] Fax:(336) [phone]   ID: Lori [name] DOB: 1965/06/15  MR#: [id number]  MNO#:[id number]  Patient Care Team: Lori Shan, MD as PCP - General (Family Medicine) Lori Messing III, MD as Consulting Physician (General Surgery) Lori Cruel, MD as Consulting Physician (Oncology) Lori Gibson, MD as Attending Physician (Radiation Oncology) Lori Germany, RN as Registered Nurse Lori Kaufmann, RN as Registered Nurse Lori Bouche, NP as Nurse Practitioner (Nurse Practitioner) Lori Cheese, NP as Nurse Practitioner (Hematology and Oncology) PCP: Lori Simmer, MD GYN: Lori Ache MD OTHER MD:  CHIEF COMPLAINT: Estrogen receptor positive breast cancer  CURRENT TREATMENT: Tamoxifen  BREAST CANCER HISTORY: From the original intake note:  Lori [name] (who is the daughter of my former patient, Lori [name], herself now 10 years out from her T1 cN0 invasive ductal carcinoma, treated with anti-estrogens only) [facility] April 20 03/23/2014. Breast density was category C. A possible mass in the left breast upper outer quadrant was felt to warrant further evaluation, and on 07/04/2014 the patient underwent left mammography with tomosynthesis and left breast ultrasonography. At this point the patient felt she was able to palpate a mass in the area in question. Tomosynthesis did reveal an irregular mass in the upper outer left breast measuring 2.3 cm, associated on physical exam with a large area of thickening. Ultrasound of the area in question confirmed an irregular mass at the 1:00 position 5 cm from the nipple measuring 2.2 cm. The left axilla was negative sonographically.  Biopsy of the mass in question the same day, 07/04/2014, showed ([phone]) an invasive lobular breast cancer which was estrogen receptor 50% positive with strong staining intensity, progesterone receptor 1%  positive with moderate staining intensity, with a proliferation marker of 1%, and no HER-2 amplification, the signals ratio being 1.19 and the number per cell 1.55.  On 07/08/2014 the patient underwent bilateral breast MRI. This showed no abnormal adenopathy and no abnormality in the right breast. In the left breast however there was a 9.3 area of abnormal enhancement involving all quadrants. Within the upper outer quadrant there was a denser area which measured up to 5.1 cm.  The patient's subsequent history is as detailed below  INTERVAL HISTORY: Lori [name] returns today for follow-up of her estrogen receptor positive breast cancer accompanied by her husband "Lori [name]".Lori [name] continues on tamoxifen, generally with fair tolerance. She does have hot flashes which can wake her up at night. She obtains it at a good price.  ROS: She feels very tired. She is gaining weight. In addition currently she has a pretty bad cold, with a runny nose, sore throat, mouth sores, and a cough productive of yellow phlegm. 2 of her children are sick. She actually has not been taking her temperature but her husband feels she has been hot. She has some joint pain and she has had some swelling of the index finger of the left hand is a feeling of tightness all up and down the arms sometimes. She started using a compression sleeve which is helping. Aside from these issues a detailed review of systems today was stable    PAST MEDICAL HISTORY: Past Medical History:  Diagnosis Date  . Arthritis    knees  . History of breast cancer 07/2014    PAST SURGICAL HISTORY: Past Surgical History:  Procedure Laterality Date  . CESAREAN SECTION  03/03/2002; 07/18/2010   x 2  . KNEE  ARTHROSCOPY Left 07/27/2013   Procedure: LEFT ARTHROSCOPY KNEE WITH MEDIAL MENISCAL DEBRIDEMENT;  Surgeon: Lori Alf, MD;  Location: [facility];  Service: Orthopedics;  Laterality: Left;  Marland Kitchen MASTECTOMY W/ SENTINEL NODE BIOPSY Bilateral 08/10/2014   Procedure:  MASTECTOMY WITH SENTINEL LYMPH NODE BIOPSY;  Surgeon: Lori Messing III, MD;  Location: [place];  Service: General;  Laterality: Bilateral;  . PORT-A-CATH REMOVAL Right 06/01/2015   Procedure: REMOVAL PORT-A-CATH;  Surgeon: Lori Messing III, MD;  Location: [place];  Service: General;  Laterality: Right;  . PORTACATH PLACEMENT Right 09/07/2014   Procedure: INSERTION PORT-A-CATH;  Surgeon: Lori Messing III, MD;  Location: [place];  Service: General;  Laterality: Right;  . TUBAL LIGATION  07/18/2010    FAMILY HISTORY Family History  Problem Relation Age of Onset  . Breast cancer Mother 7  . Lymphoma Maternal Aunt 71  . Uterine cancer Paternal Aunt 36  . Uterine cancer Paternal Grandmother 60  . Ovarian cancer Other 88    mat great aunt through [facility]  . Ovarian cancer Maternal Grandmother 19  The patient's father died with congestive heart failure at the age of 27. The patient's mother is living, age 79. She was diagnosed with breast cancer at age 73. The patient's paternal grandmother was diagnosed with uterine cancer at age 16 and her daughter, the patient's paternal aunt, also with uterine cancer at age 57. On the mother's side one maternal aunt was diagnosed with lymphoma and the other with throat cancer. A maternal great aunt at age 71 was diagnosed with ovarian cancer.   GYNECOLOGIC HISTORY:  No LMP recorded.  menarche age 58, first live birth age 57, which the patient is aware double as the risk of breast cancer. She is GX P2. She still having regular periods. 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 Her husband Lori [name] the third (goes by "Lori [name]") works in Engineer, technical sales, although currently he is looking for work. Their children are [name] 12 and [name] 3. The patient is not a church attender    ADVANCED DIRECTIVES: Not in place   HEALTH MAINTENANCE: Social History  Substance Use Topics  .  Smoking status: Former Smoker    Packs/day: 0.00    Years: 8.00    Quit date: 07/20/2000  . Smokeless tobacco: Never Used  . Alcohol use Yes     Comment: occasionally     Colonoscopy:  PAP:  Bone density:  Lipid panel:  No Known Allergies  Current Outpatient Prescriptions  Medication Sig Dispense Refill  . guaiFENesin (MUCINEX) 600 MG 12 hr tablet Take by mouth 2 (two) times daily.    Marland Kitchen ibuprofen (ADVIL,MOTRIN) 200 MG tablet Take 200 mg by mouth every 6 (six) hours as needed.    . tamoxifen (NOLVADEX) 20 MG tablet Take 1 tablet (20 mg total) by mouth daily. Reported on 05/08/2015 90 tablet 3   No current facility-administered medications for this visit.     OBJECTIVE: young White woman Who appears stated age 89:   02/06/16 0851  BP: 116/70  Pulse: 86  Resp: 18  Temp: 97.6 F (36.4 C)     Body mass index is 36.42 kg/m.      ECOG FS 1  Sclerae unicteric, EOMs intact Oropharynx clear and moist No cervical or supraclavicular adenopathy Lungs no rales or rhonchi Heart regular rate and rhythm Abd soft, nontender, positive bowel sounds MSK  no focal spinal tenderness, no left upper extremity lymphedema although then minimal swelling of the left index finger Neuro: nonfocal, well oriented, appropriate affect Breasts: Status post bilateral mastectomies with no evidence of chest wall recurrence    LAB RESULTS:  INo results found for: SPEP, UPEP  CBC Latest Ref Rng & Units 01/30/2016 10/31/2015 04/19/2015  WBC 3.9 - 10.3 10e3/uL 5.8 5.2 5.6  Hemoglobin 11.6 - 15.9 g/dL 13.6 13.6 14.1  Hematocrit 34.8 - 46.6 % 40.0 40.7 42.3  Platelets 145 - 400 10e3/uL 229 210 211   CMP Latest Ref Rng & Units 01/30/2016 10/31/2015 04/19/2015  Glucose 70 - 140 mg/dl 102 121 93  BUN 7.0 - 26.0 mg/dL 15.4 13.2 12.3  Creatinine 0.6 - 1.1 mg/dL 0.7 0.7 0.7  Sodium 136 - 145 mEq/L 139 141 139  Potassium 3.5 - 5.1 mEq/L 4.3 4.4 4.3  Chloride 101 - 111 mmol/L - - -  CO2 22 - 29 mEq/L 22  21(L) 24  Calcium 8.4 - 10.4 mg/dL 9.4 9.5 9.6  Total Protein 6.4 - 8.3 g/dL 6.9 6.9 7.3  Total Bilirubin 0.20 - 1.20 mg/dL 0.34 0.38 0.39  Alkaline Phos 40 - 150 U/L 102 90 91  AST 5 - 34 U/L _0 ALT 0 - 55 U/L 38 55 25     STUDIES:Mr Abdomen W Wo Contrast  Result Date: 01/31/2016 CLINICAL DATA:  Evaluate pancreas lesion EXAM: MRI ABDOMEN WITHOUT AND WITH CONTRAST TECHNIQUE: Multiplanar multisequence MR imaging of the abdomen was performed both before and after the administration of intravenous contrast. CONTRAST:  42m MULTIHANCE GADOBENATE DIMEGLUMINE 529 MG/ML IV SOLN COMPARISON:  None. FINDINGS: Lower chest: No pleural fluid identified. Hepatobiliary: Mild hepatic steatosis. No suspicious liver abnormalities identified. The gallbladder appears normal. No biliary dilatation. Pancreas: The pancreatic duct has a normal caliber. In the distal tail of pancreas there is a cystic lesion which measures 1.4 cm, image 51 of series 1,002. Unchanged from previous exam. Spleen:  Within normal limits in size and appearance. Adrenals/Urinary Tract: No masses identified. No evidence of hydronephrosis. Stomach/Bowel: Visualized portions within the abdomen are unremarkable. Vascular/Lymphatic: No pathologically enlarged lymph nodes identified. No abdominal aortic aneurysm demonstrated. Other:  None. Musculoskeletal: No suspicious bone lesions identified. IMPRESSION: 1. Stable simple appearing cystic lesion within the distal tail of pancreas. Differential considerations include pseudocyst versus indolent cystic neoplasm. For lesions less than 1.5 cm annual follow-up is advised in order to establish a 5 year period of stability. If after 5 years lesion is stable then followup imaging every 2 years to establish a total stability of 9 years. This recommendation follows ACR consensus guidelines: Management of Incidental Pancreatic Cysts: A White Paper of the ACR Incidental Findings Committee. [place] [id number];46:[id number] Electronically Signed   By: TKerby MoorsM.D.   On: 01/31/2016 13:42    ASSESSMENT: 50y.o. BRCA negative [place] woman status post left breast upper outer quadrant biopsy 07/04/2014 for a clinical T3 N0, stage IIA invasive lobular breast cancer, grade 2, estrogen receptor positive, progesterone receptor 1% "positive", with an MIB-1 of less than 5% and no HER-2 amplification  (1) genetics testing 07/29/2014 through the [facility] gene panel offered by [facility] no deleterious mutations in ATM, BARD1, BRCA1, BRCA2, BRIP1, CDH1, CHEK2, EPCAM, MLH1, MRE11A, MSH2, MSH6, MUTYH, NBN, NF1, PALB2, PMS2, PTEN, RAD50, RAD51C, RAD51D, SMARCA4, STK11, or TP53.   (2) status post bilateral mastectomies 08/10/2014 showing  (a) on the right, lobular carcinoma in  situ  (b) on the left, a pT3 pN1, stage IIIA invasive lobular breast cancer, HER-2 repeated and again negative  (3) chemotherapy started 10/11/2014,consisting of doxorubicin and cyclophosphamide in dose dense fashion 4, completed 11/22/2014, followed by paclitaxel weekly 6 started 12/06/2014. Abraxane given once, as cycle #7,and tolerated poorly. Completed 5 additional doses of paclitaxel 03/07/2015 (total 12 doses)  (4) radiation completed 03/30/2015-05/10/2015  1. Left chest wall and regional nodes / 50 Gy in 25 fractions to chest wall , 45 Gy in 25 fractions to SCV/PAB  2. Left chest wall scar boost / 10 Gy in 5 fractions  (5) tamoxifen was started neoadjuvantly on 07/13/2014, was held during chemotherapy, was resumed   mid-March 2017  (6) 5 mm cystic lesion in the tail of the pancreas noted by CT scan of the abdomen and pelvis 08/31/2014  (a) repeat abdominal MRI 07/18/2015 show some growth of the lesion, to 1.4 cm. (c/w CT scan)  (b) repeat abdominal MRI 01/31/2016 is entirely unchanged; annual follow-up is advised   PLAN: Kattia is now a year and a half out from definitive surgery for her breast cancer with no  evidence of disease recurrence. This is very favorable.  She was slammed into menopause so it is not surprising that she is having difficulty adjusting. I reassured her about her bone since tamoxifen helps with that. As far as the insomnia and nighttime flashes I think gabapentin would be helpful to her. We discussed the possible toxicities, side effects and complications of this agent and I went ahead and placed the order for her.  She feels very tired but she is not exercising. My suggestion is that she did indicate 45 minutes to an hour every day or at least 5 days a week just to herself. She did very well when she participated in the [place] program in the past and [facility] would [facility] and [facility] on a regular basis. She cannot do that she can participate in our tai chi or yoga programs here or she can start a walking program on her. Any of these would be helpful as far as her weight control also is concerned.  She will see her gynecologist in May. She will see me again in one year. Before that visit we will again repeat an MRI of the abdomen and lab work.  She knows to call for any problems that may develop before that return visit. Lori Cruel, MD   02/06/2016 9:22 AM

## 2016-03-06 ENCOUNTER — Other Ambulatory Visit: Payer: Self-pay | Admitting: Nurse Practitioner

## 2016-11-05 ENCOUNTER — Other Ambulatory Visit: Payer: Self-pay

## 2016-11-05 MED ORDER — TAMOXIFEN CITRATE 20 MG PO TABS: 20.0000 mg | ORAL_TABLET | Freq: Every day | ORAL | 3 refills | Status: DC

## 2017-01-29 ENCOUNTER — Ambulatory Visit (HOSPITAL_COMMUNITY): Payer: BLUE CROSS/BLUE SHIELD

## 2017-01-29 ENCOUNTER — Other Ambulatory Visit: Payer: BLUE CROSS/BLUE SHIELD

## 2017-02-05 ENCOUNTER — Ambulatory Visit: Payer: BLUE CROSS/BLUE SHIELD | Admitting: Oncology

## 2017-02-24 IMAGING — MG MM DIGITAL DIAGNOSTIC UNILAT*L*
2 series · 2 of 2 positions shown · non-contrast
Comparison: Previous exam(s).

CLINICAL DATA: Post biopsy of a highly suspicious mass in the
upper-outer left breast.

EXAM:
DIAGNOSTIC LEFT MAMMOGRAM POST ULTRASOUND BIOPSY

[L CC]
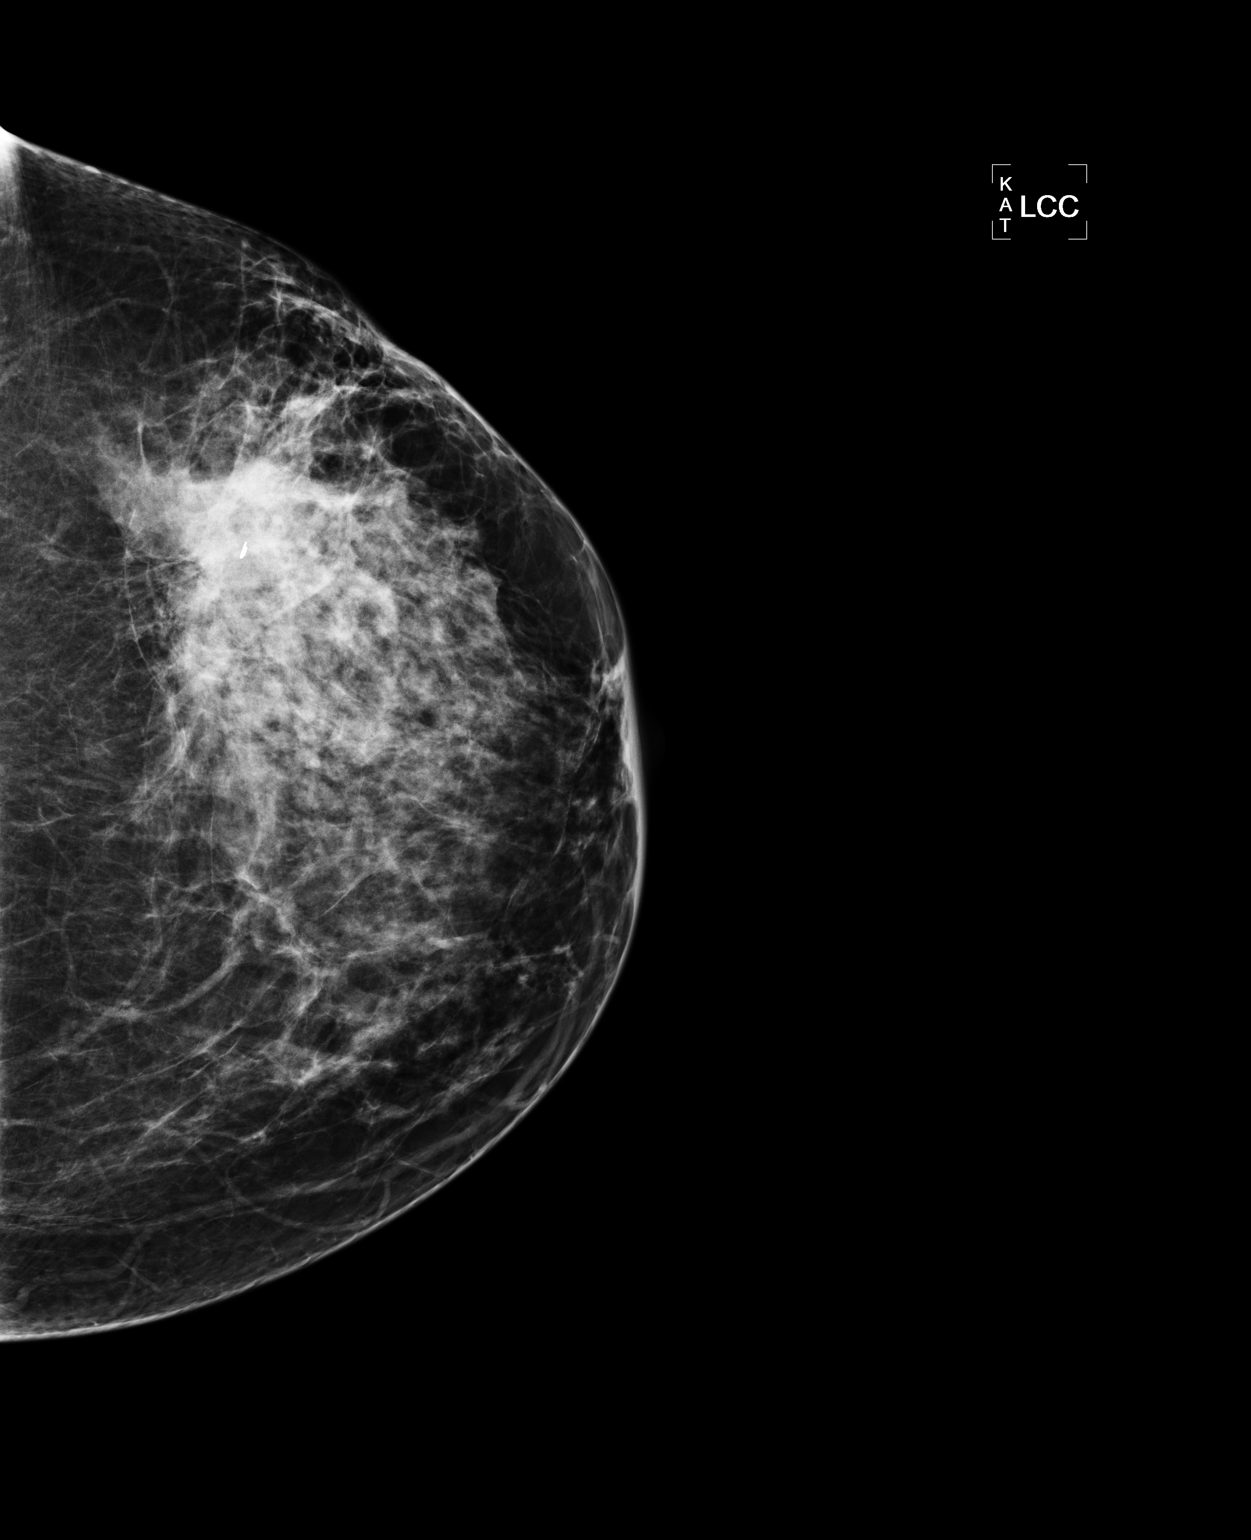

[L ML]
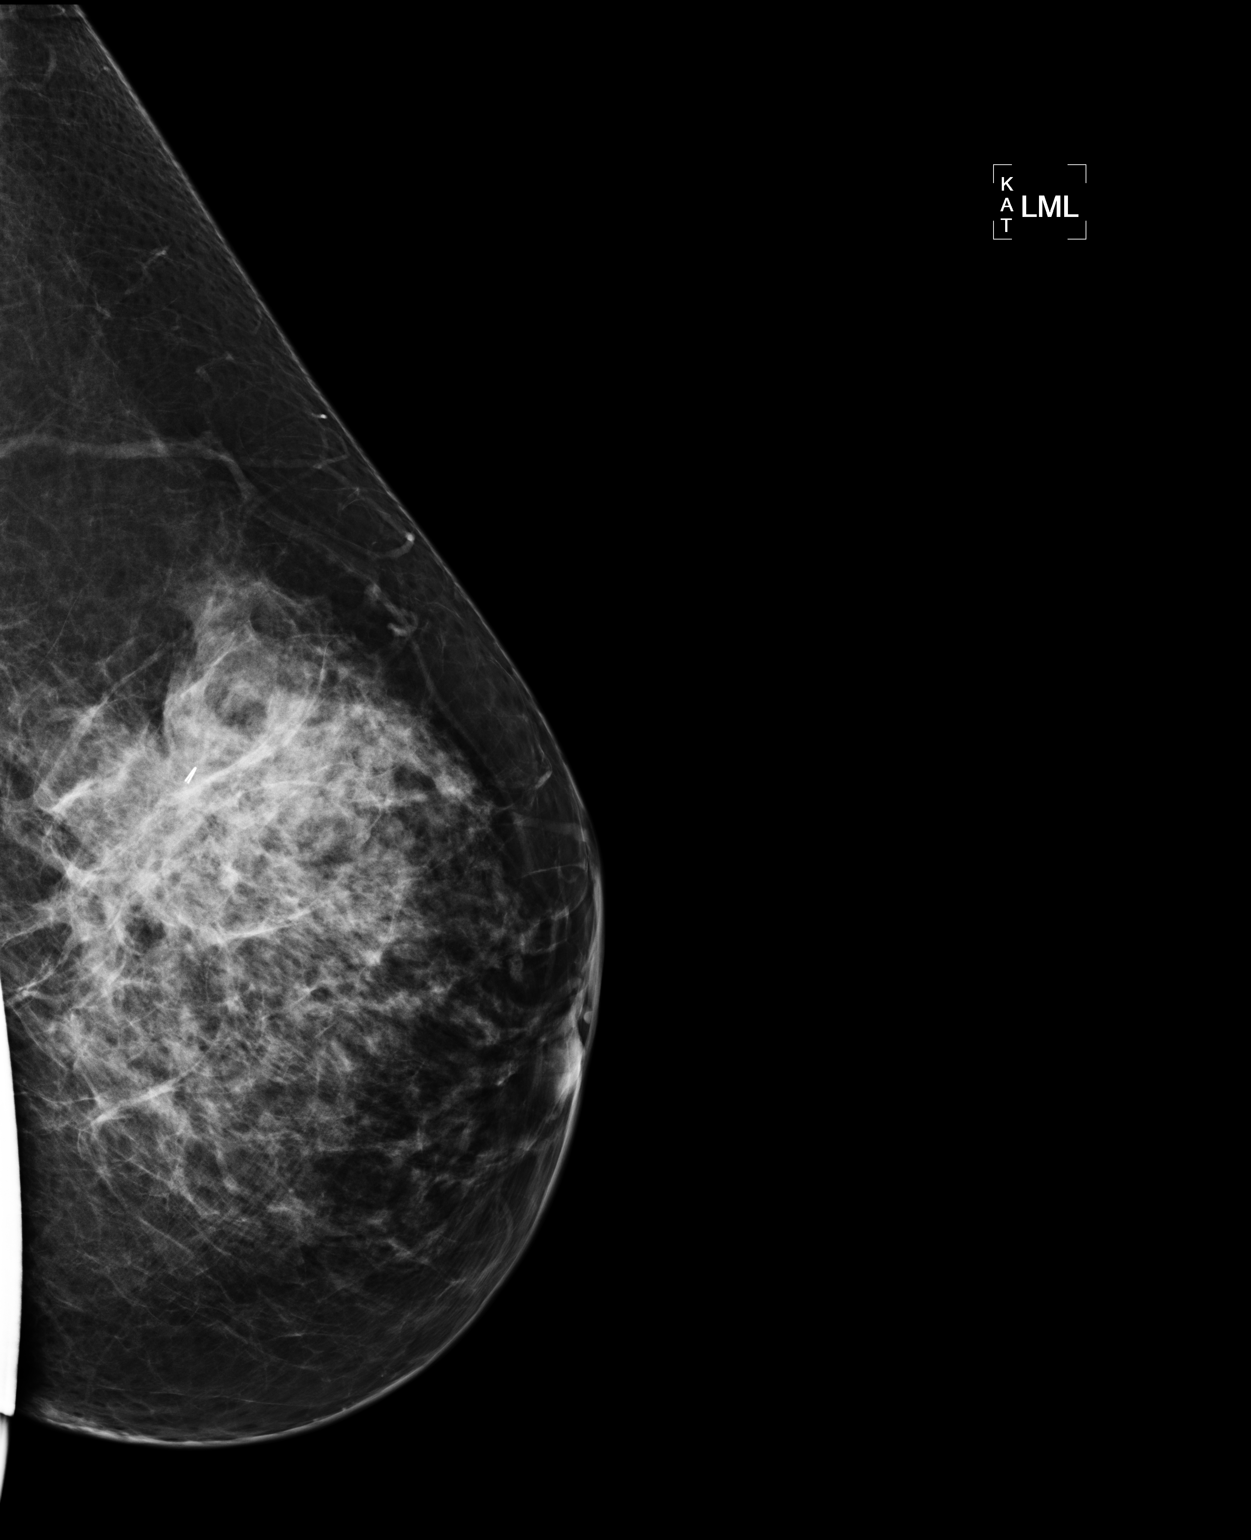

[2 of 2 positions shown; findings below may reference images not displayed]

FINDINGS: Mammographic images were obtained following ultrasound guided biopsy
of the suspicious mass in the upper-outer left breast at 1-2
o'clock. A ribbon shaped biopsy marking clip is present in the
targeted region of the left breast mass.
IMPRESSION: Appropriate positioning of ribbon shaped biopsy marking clip post
ultrasound-guided biopsy of a highly suspicious mass in the
upper-outer left breast at 1-2 o'clock.

Final Assessment: Post Procedure Mammograms for Marker Placement

## 2017-02-24 IMAGING — MG MM DIAG BREAST TOMO UNI LEFT
6 series · 6 of 14 positions shown · non-contrast
Comparison: Previous exams.

CLINICAL DATA: Screening recall for a possible mass in the outer
left breast. The patient feels an area of thickening in the outer
left breast.

EXAM:
DIGITAL DIAGNOSTIC LEFT MAMMOGRAM WITH 3D TOMOSYNTHESIS AND CAD
LEFT BREAST ULTRASOUND

[L CC]
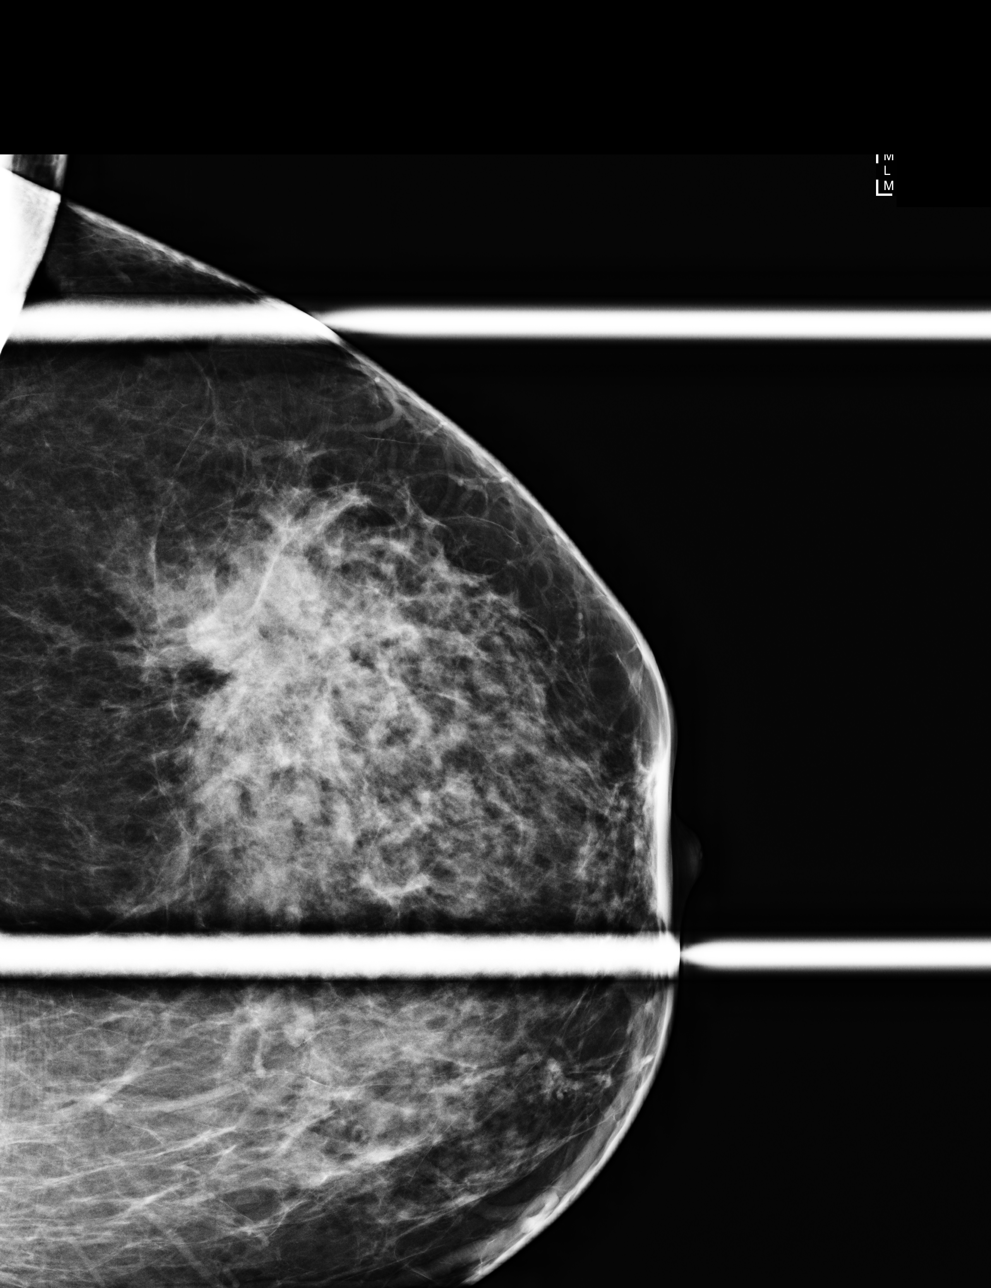

[L MLO]
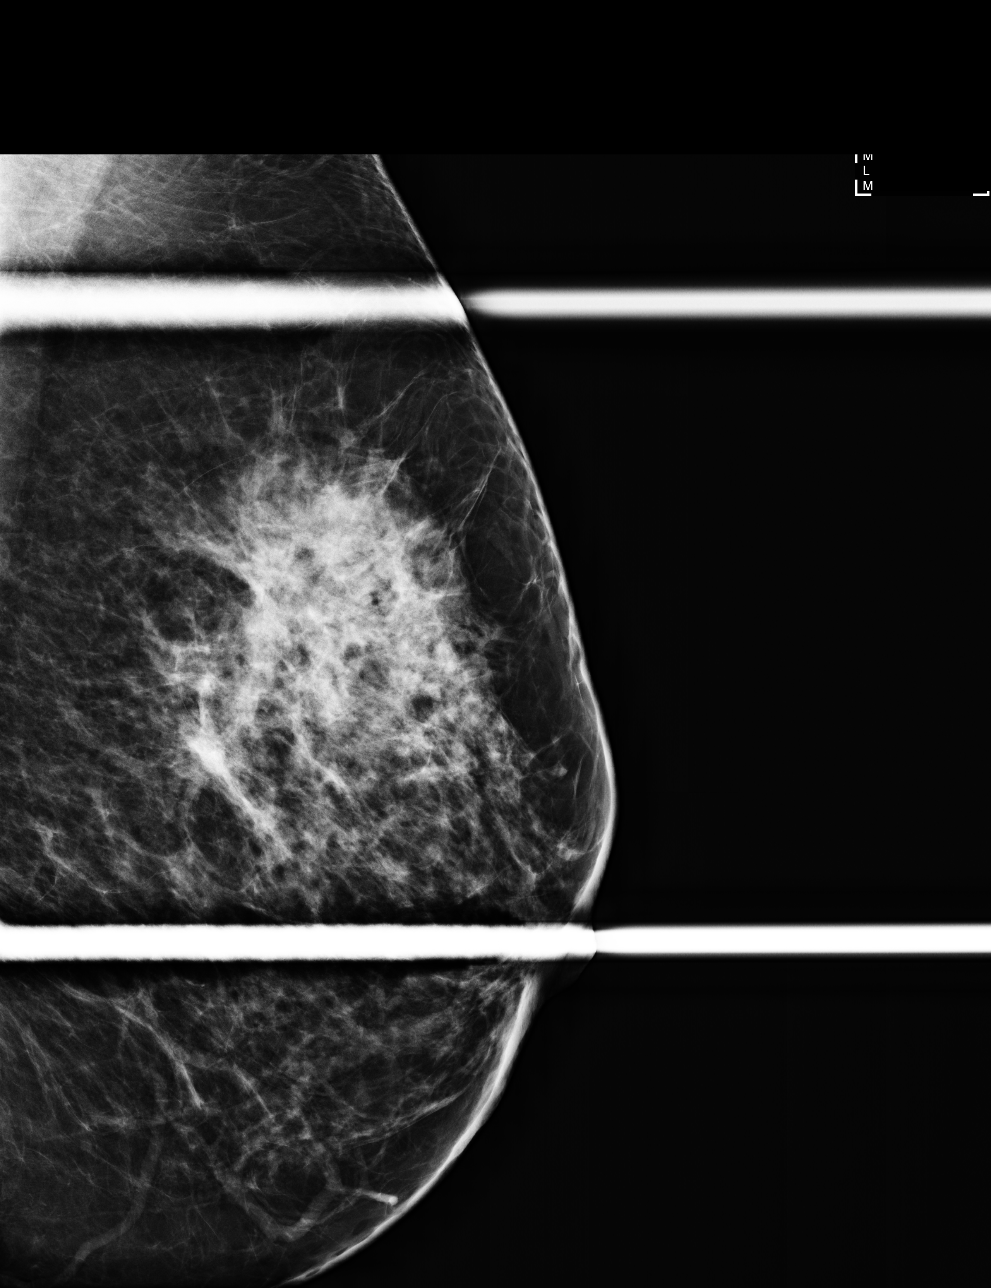

[L MLO tomo (1 of 2)]
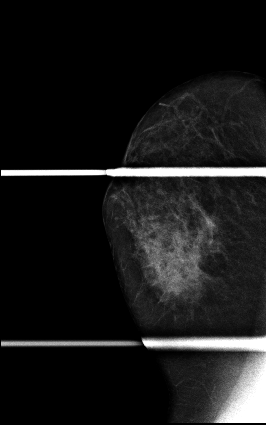

[L CC tomo (1 of 2)]
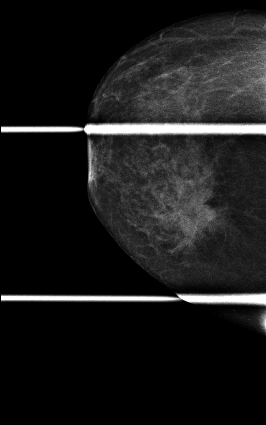

[L CC tomo (2 of 2) · tomo slice 36/71.0]
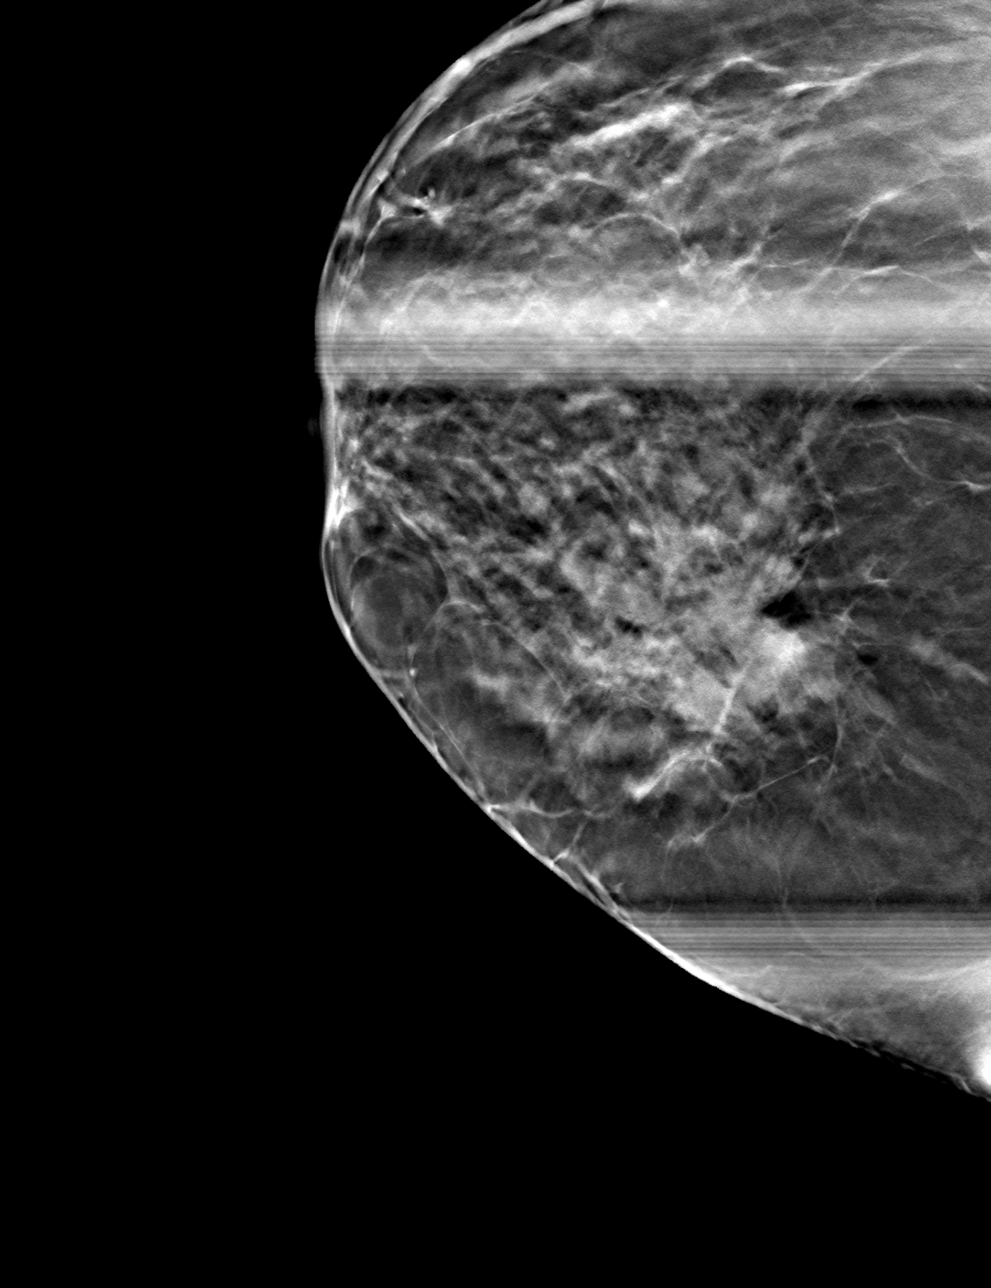

[L MLO tomo (2 of 2) · tomo slice 39/77.0]
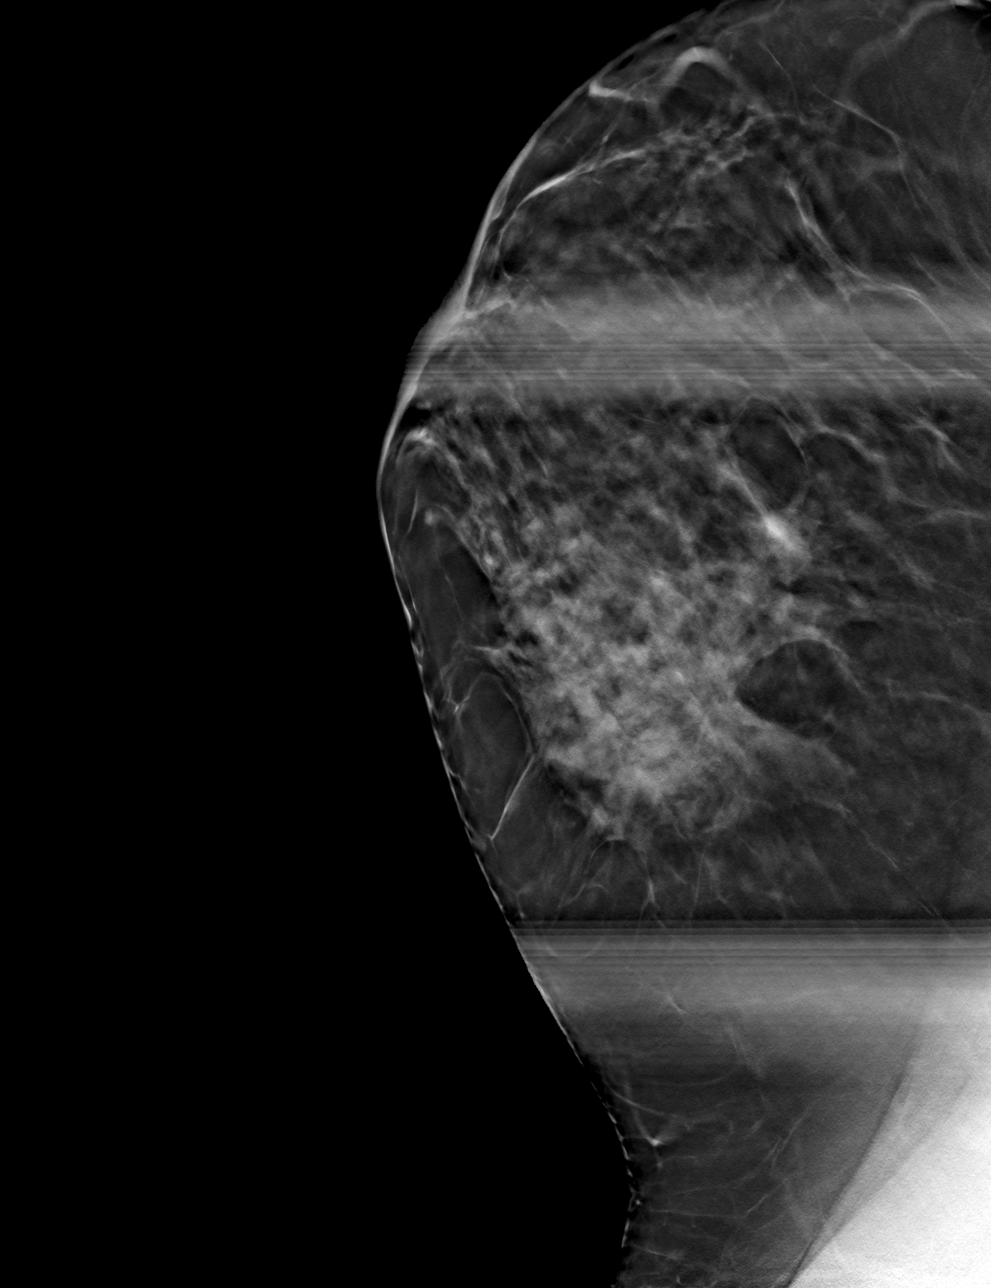

[6 of 14 positions shown; findings below may reference images not displayed]

ACR Breast Density Category c: The breast tissue is heterogeneously
dense, which may obscure small masses.
FINDINGS: Spot compression CC and MLO tomosynthesis was performed over the
upper-outer left breast demonstrating an irregular mass with
associated distortion measuring approximately 2.3 cm.

Mammographic images were processed with CAD.

Physical examination of the outer left breast reveals a large area
of thickening at the approximate 1 to 2 o'clock position.

Targeted ultrasound of the left breast was performed demonstrating
an irregular shadowing mass at 1 o'clock 5 cm from the nipple
measuring 1.5 x 1.2 x 2.2 cm. The area of palpable concern feels
larger than would is seen sonographically.

No lymphadenopathy seen in the left axilla.
IMPRESSION: Highly suspicious left breast mass.

RECOMMENDATION:
Ultrasound-guided biopsy of the highly suspicious mass in the
upper-outer left breast is recommended. This will subsequently be
performed and dictated separately.

I have discussed the findings and recommendations with the patient.
Results were also provided in writing at the conclusion of the
visit. If applicable, a reminder letter will be sent to the patient
regarding the next appointment.

BI-RADS CATEGORY  5: Highly suggestive of malignancy.

## 2017-03-10 ENCOUNTER — Inpatient Hospital Stay: Payer: BLUE CROSS/BLUE SHIELD | Attending: Oncology

## 2017-03-10 ENCOUNTER — Other Ambulatory Visit: Payer: Self-pay | Admitting: Oncology

## 2017-03-10 ENCOUNTER — Ambulatory Visit (HOSPITAL_COMMUNITY)
Admission: RE | Admit: 2017-03-10 | Discharge: 2017-03-10 | Disposition: A | Discharge status: Home / Self Care | Payer: BLUE CROSS/BLUE SHIELD | Source: Ambulatory Visit | Attending: Oncology | Admitting: Oncology

## 2017-03-10 DIAGNOSIS — Z170 Estrogen receptor positive status [ER+]: Secondary | ICD-10-CM | POA: Diagnosis not present

## 2017-03-10 DIAGNOSIS — Z9221 Personal history of antineoplastic chemotherapy: Secondary | ICD-10-CM | POA: Diagnosis not present

## 2017-03-10 DIAGNOSIS — Z79899 Other long term (current) drug therapy: Secondary | ICD-10-CM | POA: Insufficient documentation

## 2017-03-10 DIAGNOSIS — K862 Cyst of pancreas: Secondary | ICD-10-CM | POA: Insufficient documentation

## 2017-03-10 DIAGNOSIS — C50412 Malignant neoplasm of upper-outer quadrant of left female breast: Secondary | ICD-10-CM | POA: Insufficient documentation

## 2017-03-10 DIAGNOSIS — Z87891 Personal history of nicotine dependence: Secondary | ICD-10-CM | POA: Diagnosis not present

## 2017-03-10 DIAGNOSIS — Z9013 Acquired absence of bilateral breasts and nipples: Secondary | ICD-10-CM | POA: Diagnosis not present

## 2017-03-10 DIAGNOSIS — Z923 Personal history of irradiation: Secondary | ICD-10-CM | POA: Diagnosis not present

## 2017-03-10 LAB — COMPREHENSIVE METABOLIC PANEL
ALK PHOS: 84 U/L (ref 40–150)
ALT: 41 U/L (ref 0–55)
AST: 22 U/L (ref 5–34)
Albumin: 3.6 g/dL (ref 3.5–5.0)
Anion gap: 6 (ref 3–11)
BUN: 12 mg/dL (ref 7–26)
CALCIUM: 9.1 mg/dL (ref 8.4–10.4)
CO2: 27 mmol/L (ref 22–29)
CREATININE: 0.76 mg/dL (ref 0.60–1.10)
Chloride: 107 mmol/L (ref 98–109)
GFR calc non Af Amer: >60 mL/min (ref >60)
GLUCOSE: 92 mg/dL (ref 70–140)
Potassium: 4.1 mmol/L (ref 3.3–4.7)
SODIUM: 140 mmol/L (ref 136–145)
Total Bilirubin: 0.3 mg/dL (ref 0.2–1.2)
Total Protein: 6.9 g/dL (ref 6.4–8.3)

## 2017-03-10 LAB — CBC WITH DIFFERENTIAL/PLATELET
Abs Granulocyte: 4.9 10*3/uL (ref 1.5–6.5)
Basophils Absolute: 0 10*3/uL (ref 0.0–0.1)
Basophils Relative: 1 %
EOS ABS: 0.1 10*3/uL (ref 0.0–0.5)
EOS PCT: 2 %
HCT: 40.2 % (ref 34.8–46.6)
Hemoglobin: 13.4 g/dL (ref 11.6–15.9)
LYMPHS ABS: 1.5 10*3/uL (ref 0.9–3.3)
Lymphocytes Relative: 22 %
MCH: 29.1 pg (ref 25.1–34.0)
MCHC: 33.5 g/dL (ref 31.5–36.0)
MCV: 86.9 fL (ref 79.5–101.0)
MONOS PCT: 6 %
Monocytes Absolute: 0.4 10*3/uL (ref 0.1–0.9)
NEUTROS PCT: 69 %
Neutro Abs: 4.9 10*3/uL (ref 1.5–6.5)
PLATELETS: 194 10*3/uL (ref 145–400)
RBC: 4.63 MIL/uL (ref 3.70–5.45)
RDW: 13.1 % (ref 11.2–16.1)
WBC: 7.1 10*3/uL (ref 3.9–10.3)

## 2017-03-10 MED ORDER — GADOBENATE DIMEGLUMINE 529 MG/ML IV SOLN
20.0000 mL | Freq: Once | INTRAVENOUS | Status: AC | PRN
  Administered 2017-03-10: 20 mL via INTRAVENOUS

## 2017-03-11 ENCOUNTER — Other Ambulatory Visit: Payer: Self-pay | Admitting: Oncology

## 2017-03-11 NOTE — Progress Notes (Unsigned)
The pancreatic cystic lesion appears to be is simple cyst.  It is less than 3 cm, is not accompanied by any ductal dilatation, has no septations, and has no solid components.  This is almost certainly benign and the chance of malignancy is less than 0.5%.  I will discuss this further with the patient when she returns to see me next week.  I did call her with results

## 2017-03-13 NOTE — Progress Notes (Signed)
[place]  Telephone:([phone]) [phone] Fax:(336) [phone]   ID: Lori [name] DOB: [birth year]  MR#: [id number]  HYQ#:[id number]  Patient Care Team: Lori Shan, MD as PCP - General (Family Medicine) Lori Kussmaul, MD as Consulting Physician (General Surgery) [name], Lori Dad, MD as Consulting Physician (Oncology) Lori Gibson, MD as Attending Physician (Radiation Oncology) Lori Germany, RN as Registered Nurse Lori Kaufmann, RN as Registered Nurse Lori Bouche, NP as Nurse Practitioner (Nurse Practitioner) Lori Cheese, NP as Nurse Practitioner (Hematology and Oncology) PCP: Lori Shan, MD GYN: Lori Ache MD OTHER MD:  CHIEF COMPLAINT: Estrogen receptor positive breast cancer  CURRENT TREATMENT: Tamoxifen  BREAST CANCER HISTORY: From the original intake note:  Lori [name] (who is the daughter of my former patient, Lori [name], herself now 10 years out from her T1 cN0 invasive ductal carcinoma, treated with anti-estrogens only) [facility] April 20 03/23/2014. Breast density was category C. A possible mass in the left breast upper outer quadrant was felt to warrant further evaluation, and on 07/04/2014 the patient underwent left mammography with tomosynthesis and left breast ultrasonography. At this point the patient felt she was able to palpate a mass in the area in question. Tomosynthesis did reveal an irregular mass in the upper outer left breast measuring 2.3 cm, associated on physical exam with a large area of thickening. Ultrasound of the area in question confirmed an irregular mass at the 1:00 position 5 cm from the nipple measuring 2.2 cm. The left axilla was negative sonographically.  Biopsy of the mass in question the same day, 07/04/2014, showed ([id number]) an invasive lobular breast cancer which was estrogen receptor 50% positive with strong staining intensity, progesterone  receptor 1% positive with moderate staining intensity, with a proliferation marker of 1%, and no HER-2 amplification, the signals ratio being 1.19 and the number per cell 1.55.  On 07/08/2014 the patient underwent bilateral breast MRI. This showed no abnormal adenopathy and no abnormality in the right breast. In the left breast however there was a 9.3 area of abnormal enhancement involving all quadrants. Within the upper outer quadrant there was a denser area which measured up to 5.1 cm.  The patient's subsequent history is as detailed below  INTERVAL HISTORY: Lori [name] returns today for follow-up of her estrogen receptor positive breast cancer. She continues on tamoxifen, with good tolerance. She notes she still has hot flashes, and she takes tamoxifen at night, which seems to lessen her frequency of hot flashes during the day. She notes that she might have a few after she lays down for bed, but they do not bother her after she goes to sleep. She notes that she has a few during the day. She notes that she forgot that she had Neurontin to aid with hot flashes, so she will start taking this. She notes no issues with increased vaginal wetness.   Since her last visit she completed a MRI of the abdomen on 03/10/2017 showing: 1.8 cm simple appearing cystic lesion in the pancreatic tail shows gradual increase in size compared to previous studies dating back to 2016. This is suspicious for an indolent side-branch intraductal papillary mucinous neoplasm.   REVIEW OF SYSTEMS: [name] reports that she is doing well. She notes that her holidays were nice, and she made a point not to have any stress.  She reports that she is chronically fatigued, and because of this, she hasn't been exercising regularly.  She notes having pain in her knees and joints. She notes that if she walks, she starts limping after a while. She notes that lately she has been finding it more difficult to walk. [facility] [employer], and she has  thought about swimming. She denies unusual headaches, visual changes, nausea, vomiting, or dizziness. There has been no unusual cough, phlegm production, or pleurisy. This been no change in bowel or bladder habits. She denies unexplained fatigue or unexplained weight loss, bleeding, rash, or fever. A detailed review of systems was otherwise stable.    PAST MEDICAL HISTORY: Past Medical History:  Diagnosis Date  . Arthritis    knees  . History of breast cancer 07/2014    PAST SURGICAL HISTORY: Past Surgical History:  Procedure Laterality Date  . CESAREAN SECTION  03/03/2002; 07/18/2010   x 2  . KNEE ARTHROSCOPY Left 07/27/2013   Procedure: LEFT ARTHROSCOPY KNEE WITH MEDIAL MENISCAL DEBRIDEMENT;  Surgeon: Lori Alf, MD;  Location: [facility];  Service: Orthopedics;  Laterality: Left;  Marland Kitchen MASTECTOMY W/ SENTINEL NODE BIOPSY Bilateral 08/10/2014   Procedure: MASTECTOMY WITH SENTINEL LYMPH NODE BIOPSY;  Surgeon: Lori Messing III, MD;  Location: [facility];  Service: General;  Laterality: Bilateral;  . PORT-A-CATH REMOVAL Right 06/01/2015   Procedure: REMOVAL PORT-A-CATH;  Surgeon: Lori Messing III, MD;  Location: [place];  Service: General;  Laterality: Right;  . PORTACATH PLACEMENT Right 09/07/2014   Procedure: INSERTION PORT-A-CATH;  Surgeon: Lori Messing III, MD;  Location: [place];  Service: General;  Laterality: Right;  . TUBAL LIGATION  07/18/2010    FAMILY HISTORY Family History  Problem Relation Age of Onset  . Breast cancer Mother 89  . Lymphoma Maternal Aunt 71  . Uterine cancer Paternal Aunt 41  . Uterine cancer Paternal Grandmother 38  . Ovarian cancer Other 76       mat great aunt through [facility]  . Ovarian cancer Maternal Grandmother 78  The patient's father died with congestive heart failure at the age of 57. The patient's mother is living, age 29. She was diagnosed with breast cancer at age 19. The patient's paternal grandmother was diagnosed with uterine  cancer at age 25 and her daughter, the patient's paternal aunt, also with uterine cancer at age 47. On the mother's side one maternal aunt was diagnosed with lymphoma and the other with throat cancer. A maternal great aunt at age 3 was diagnosed with ovarian cancer.   GYNECOLOGIC HISTORY:  No LMP recorded.  menarche age 39, first live birth age 55, which the patient is aware double as the risk of breast cancer. She is GX P2. She still having regular periods. 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 Her husband Lori [name] the third (goes by "[name]") works in Engineer, technical sales, although currently he is looking for work. Their children are [name] 12 and [name] 3. The patient is not a church attender    ADVANCED DIRECTIVES: Not in place   HEALTH MAINTENANCE: Social History   Tobacco Use  . Smoking status: Former Smoker    Packs/day: 0.00    Years: 8.00    Pack years: 0.00    Last attempt to quit: 07/20/2000    Years since quitting: 16.6  . Smokeless tobacco: Never Used  Substance Use Topics  . Alcohol use: Yes    Comment: occasionally  . Drug use: No  Colonoscopy:  PAP:  Bone density:  Lipid panel:  No Known Allergies  Current Outpatient Medications  Medication Sig Dispense Refill  . Doxylamine-DM & DM (VICKS DAYQUIL/NYQUIL COUGH) 6.25-15 & 15 MG/15ML LQPK Take by mouth.    . gabapentin (NEURONTIN) 300 MG capsule Take 1 capsule (300 mg total) by mouth at bedtime. 90 capsule 4  . guaiFENesin (MUCINEX) 600 MG 12 hr tablet Take by mouth 2 (two) times daily.    Marland Kitchen ibuprofen (ADVIL,MOTRIN) 200 MG tablet Take 200 mg by mouth every 6 (six) hours as needed.    . tamoxifen (NOLVADEX) 20 MG tablet Take 1 tablet (20 mg total) by mouth daily. Reported on 05/08/2015 90 tablet 3   No current facility-administered medications for this visit.     OBJECTIVE: young White woman in no acute distress  Vitals:   03/17/17 0917  BP:  120/71  Pulse: 100  Resp: 18  Temp: 98.4 F (36.9 C)  SpO2: 97%     Body mass index is 38.31 kg/m.      ECOG FS 1  Sclerae unicteric, pupils round and equal Oropharynx clear and moist No cervical or supraclavicular adenopathy Lungs no rales or rhonchi Heart regular rate and rhythm Abd soft, nontender, positive bowel sounds MSK no focal spinal tenderness, no upper extremity lymphedema Neuro: nonfocal, well oriented, appropriate affect Breasts: She has undergone bilateral mastectomies.  There is no evidence of chest wall recurrence.  Both axillae are benign.  LAB RESULTS:  INo results found for: SPEP, UPEP  CBC Latest Ref Rng & Units 03/10/2017 01/30/2016 10/31/2015  WBC 3.9 - 10.3 K/uL 7.1 5.8 5.2  Hemoglobin 11.6 - 15.9 g/dL 13.4 13.6 13.6  Hematocrit 34.8 - 46.6 % 40.2 40.0 40.7  Platelets 145 - 400 K/uL 194 229 210   CMP Latest Ref Rng & Units 03/10/2017 01/30/2016 10/31/2015  Glucose 70 - 140 mg/dL 92 102 121  BUN 7 - 26 mg/dL 12 15.4 13.2  Creatinine 0.60 - 1.10 mg/dL 0.76 0.7 0.7  Sodium 136 - 145 mmol/L 140 139 141  Potassium 3.3 - 4.7 mmol/L 4.1 4.3 4.4  Chloride 98 - 109 mmol/L 107 - -  CO2 22 - 29 mmol/L 27 22 21(L)  Calcium 8.4 - 10.4 mg/dL 9.1 9.4 9.5  Total Protein 6.4 - 8.3 g/dL 6.9 6.9 6.9  Total Bilirubin 0.2 - 1.2 mg/dL 0.3 0.34 0.38  Alkaline Phos 40 - 150 U/L 84 102 90  AST 5 - 34 U/L _0 ALT 0 - 55 U/L 41 38 55     STUDIES:Mr Abdomen W Wo Contrast  Result Date: 03/10/2017 CLINICAL DATA:  Followup cystic pancreatic lesion. Left upper outer quadrant breast carcinoma. EXAM: MRI ABDOMEN WITHOUT AND WITH CONTRAST TECHNIQUE: Multiplanar multisequence MR imaging of the abdomen was performed both before and after the administration of intravenous contrast. CONTRAST:  44m MULTIHANCE GADOBENATE DIMEGLUMINE 529 MG/ML IV SOLN COMPARISON:  Previous MRIs and CTs dating back to 08/31/2014 FINDINGS: Lower chest: No acute findings. Hepatobiliary: No hepatic masses  identified. Gallbladder is unremarkable. No evidence of biliary ductal dilatation. Pancreas: A simple appearing cystic lesion is seen in the pancreatic tail which measures 1.8 cm on image 23/10. This shows mild increase in size from 1.5 cm on most recent study and 5 mm on CT in 2016. This lesion shows no evidence of internal septations in, contrast enhancement, or solid nodules. No other pancreatic masses are identified. No evidence of pancreatic ductal dilatation. Spleen:  Within normal  limits in size and appearance. Adrenals/Urinary Tract: No masses identified. No evidence of hydronephrosis. Stomach/Bowel: Visualized abdominal bowel unremarkable. Vascular/Lymphatic: No pathologically enlarged lymph nodes identified. No abdominal aortic aneurysm. Other:  None. Musculoskeletal:  No suspicious bone lesions identified. IMPRESSION: 1.8 cm simple appearing cystic lesion in the pancreatic tail shows gradual increase in size compared to previous studies dating back to 2016. This is suspicious for an indolent side-branch intraductal papillary mucinous neoplasm. Consider continued followup by MRI in 1 year or endoscopic ultrasound/FNA. This recommendation follows ACR consensus guidelines: Management of Incidental Pancreatic Cysts: A White Paper of the ACR Incidental Findings Committee. [place] [id number]. Electronically Signed   By: Earle Gell M.D.   On: 03/10/2017 12:03    ASSESSMENT: 52 y.o. BRCA negative [place] woman status post left breast upper outer quadrant biopsy 07/04/2014 for a clinical T3 N0, stage IIA invasive lobular breast cancer, grade 2, estrogen receptor positive, progesterone receptor 1% "positive", with an MIB-1 of less than 5% and no HER-2 amplification  (1) genetics testing 07/29/2014 through the [employer] gene panel offered by [employer] found no deleterious mutations in ATM, BARD1, BRCA1, BRCA2, BRIP1, CDH1, CHEK2, EPCAM, MLH1, MRE11A, MSH2, MSH6, MUTYH, NBN, NF1, PALB2,  PMS2, PTEN, RAD50, RAD51C, RAD51D, SMARCA4, STK11, or TP53.   (2) status post bilateral mastectomies 08/10/2014 showing  (a) on the right, lobular carcinoma in situ  (b) on the left, a pT3 pN1, stage IIIA invasive lobular breast cancer, HER-2 repeated and again negative  (3) chemotherapy started 10/11/2014,consisting of doxorubicin and cyclophosphamide in dose dense fashion 4, completed 11/22/2014, followed by paclitaxel weekly 6 started 12/06/2014. Abraxane given once, as cycle #7,and tolerated poorly. Completed 5 additional doses of paclitaxel 03/07/2015 (total 12 doses)  (4) radiation completed 03/30/2015-05/10/2015  1. Left chest wall and regional nodes / 50 Gy in 25 fractions to chest wall , 45 Gy in 25 fractions to SCV/PAB  2. Left chest wall scar boost / 10 Gy in 5 fractions  (5) tamoxifen was started neoadjuvantly on 07/13/2014, was held during chemotherapy, was resumed   mid-March 2017  (6) 5 mm cystic lesion in the tail of the pancreas noted by CT scan of the abdomen and pelvis 08/31/2014  (a) repeat abdominal MRI 07/18/2015 show some growth of the lesion, to 1.4 cm. (c/w CT scan)  (b) repeat abdominal MRI 01/31/2016 is entirely unchanged; annual follow-up is advised  (c) abdominal MRI obtained 03/10/2017 found to the lesion to measure 1.8 cm, with no internal septations  (7) considering reconstruction or uncle plasty   PLAN: Kynesha is now 2-1/2 years out from definitive surgery for her breast cancer with no evidence of disease recurrence.  She continues on tamoxifen, generally with good tolerance.  We discussed management of hot flashes in detail today.  She does not want to try gabapentin, venlafaxine, or TTS 1.  She can "deal with it".  The plan is to continue tamoxifen for a total of 5 years then reassess  We also discussed her pancreatic cystic lesion.  This is less than 3 cm and simple.  It has increased a little but increase in size of the cyst is not sign of malignancy  in itself.  This requires only follow-up and she will have a repeat MRI next year  She is expressing some interest in plastic reconstruction.  I gave her the name of the surgeons that I feel would be able to best help her in that regard.  If she needs any  help getting those consults she will let us know  Otherwise she will see me again in a year  She knows to call for any problems that may develop before that visit.  [name], Lori Dad, MD  03/17/17 9:31 AM Medical Oncology and Hematology [facility] [address] [place], [place] [address] Tel. [phone]    Fax. [phone]  This document serves as a record of services personally performed by Lurline Del, MD. It was created on his behalf by Sheron Nightingale, a trained medical scribe. The creation of this record is based on the scribe's personal observations and the provider's statements to them.   I have reviewed the above documentation for accuracy and completeness, and I agree with the above.

## 2017-03-17 ENCOUNTER — Inpatient Hospital Stay: Payer: BLUE CROSS/BLUE SHIELD | Admitting: Oncology

## 2017-03-17 VITALS — BP 120/71 | HR 100 | Temp 98.4°F | Resp 18 | Ht 67.0 in | Wt 244.6 lb

## 2017-03-17 DIAGNOSIS — Z9221 Personal history of antineoplastic chemotherapy: Secondary | ICD-10-CM | POA: Diagnosis not present

## 2017-03-17 DIAGNOSIS — Z9013 Acquired absence of bilateral breasts and nipples: Secondary | ICD-10-CM

## 2017-03-17 DIAGNOSIS — C50412 Malignant neoplasm of upper-outer quadrant of left female breast: Secondary | ICD-10-CM

## 2017-03-17 DIAGNOSIS — Z923 Personal history of irradiation: Secondary | ICD-10-CM

## 2017-03-17 DIAGNOSIS — Z79899 Other long term (current) drug therapy: Secondary | ICD-10-CM | POA: Diagnosis not present

## 2017-03-17 DIAGNOSIS — K862 Cyst of pancreas: Secondary | ICD-10-CM

## 2017-03-17 DIAGNOSIS — Z87891 Personal history of nicotine dependence: Secondary | ICD-10-CM | POA: Diagnosis not present

## 2017-03-17 DIAGNOSIS — Z170 Estrogen receptor positive status [ER+]: Secondary | ICD-10-CM | POA: Diagnosis not present

## 2017-03-17 MED ORDER — TAMOXIFEN CITRATE 20 MG PO TABS: 20.0000 mg | ORAL_TABLET | Freq: Every day | ORAL | 3 refills | Status: DC

## 2017-05-20 ENCOUNTER — Encounter: Payer: Self-pay | Admitting: Obstetrics & Gynecology

## 2017-05-20 ENCOUNTER — Ambulatory Visit (INDEPENDENT_AMBULATORY_CARE_PROVIDER_SITE_OTHER): Payer: BLUE CROSS/BLUE SHIELD | Admitting: Obstetrics & Gynecology

## 2017-05-20 VITALS — BP 122/76 | Ht 67.0 in | Wt 243.0 lb

## 2017-05-20 DIAGNOSIS — Z1151 Encounter for screening for human papillomavirus (HPV): Secondary | ICD-10-CM

## 2017-05-20 DIAGNOSIS — N952 Postmenopausal atrophic vaginitis: Secondary | ICD-10-CM | POA: Diagnosis not present

## 2017-05-20 DIAGNOSIS — Z01411 Encounter for gynecological examination (general) (routine) with abnormal findings: Secondary | ICD-10-CM

## 2017-05-20 DIAGNOSIS — Z170 Estrogen receptor positive status [ER+]: Secondary | ICD-10-CM

## 2017-05-20 DIAGNOSIS — Z780 Asymptomatic menopausal state: Secondary | ICD-10-CM | POA: Diagnosis not present

## 2017-05-20 DIAGNOSIS — Z13820 Encounter for screening for osteoporosis: Secondary | ICD-10-CM | POA: Diagnosis not present

## 2017-05-20 DIAGNOSIS — C50412 Malignant neoplasm of upper-outer quadrant of left female breast: Secondary | ICD-10-CM

## 2017-05-20 NOTE — Progress Notes (Signed)
Lori [name] 04-Jan-1966 [id number]   History:    52 y.o. G2P2L2 Married.  S/P BT/S.  Daughters 35 and 27 yo.  RP:  Established patient presenting for annual gyn exam   HPI:  Menopause x 2016 post Chemotherapy for Left Breast lobular invasive Ca.  Status post bilateral mastectomy.  Currently on tamoxifen.  No postmenopausal bleeding.  No pelvic pain.  Last Paps have been normal, but history of CIN-2 treated by LEEP.  Mother with breast cancer as well.  Patient is BRCA 1 and 2 negative.  Health labs with Dr. Jana Hakim.  Past medical history,surgical history, family history and social history were all reviewed and documented in the EPIC chart.  Gynecologic History Patient's last menstrual period was 05/21/2014. Contraception: BT/S and post menopausal status Last Pap: 11/2015. Results were: Negative/HPV HR neg Last mammogram: 2016.  S/P Bilateral Mastectomy. Bone Density: Never.  Will schedule here now Colonoscopy: Will do at 52 yo  Obstetric History OB History  Gravida Para Term Preterm AB Living  2 2       2   SAB TAB Ectopic Multiple Live Births               # Outcome Date GA Lbr Len/2nd Weight Sex Delivery Anes PTL Lv  2 Para           1 Para                ROS: A ROS was performed and pertinent positives and negatives are included in the history.  GENERAL: No fevers or chills. HEENT: No change in vision, no earache, sore throat or sinus congestion. NECK: No pain or stiffness. CARDIOVASCULAR: No chest pain or pressure. No palpitations. PULMONARY: No shortness of breath, cough or wheeze. GASTROINTESTINAL: No abdominal pain, nausea, vomiting or diarrhea, melena or bright red blood per rectum. GENITOURINARY: No urinary frequency, urgency, hesitancy or dysuria. MUSCULOSKELETAL: No joint or muscle pain, no back pain, no recent trauma. DERMATOLOGIC: No rash, no itching, no lesions. ENDOCRINE: No polyuria, polydipsia, no heat or cold intolerance. No recent change in weight.  HEMATOLOGICAL: No anemia or easy bruising or bleeding. NEUROLOGIC: No headache, seizures, numbness, tingling or weakness. PSYCHIATRIC: No depression, no loss of interest in normal activity or change in sleep pattern.     Exam:   BP 122/76   Ht 5' 7"  (1.702 m)   Wt 243 lb (110.2 kg)   LMP 05/21/2014   BMI 38.06 kg/m   Body mass index is 38.06 kg/m.  General appearance : Well developed well nourished female. No acute distress HEENT: Eyes: no retinal hemorrhage or exudates,  Neck supple, trachea midline, no carotid bruits, no thyroidmegaly Lungs: Clear to auscultation, no rhonchi or wheezes, or rib retractions  Heart: Regular rate and rhythm, no murmurs or gallops Breast:Examined in sitting and supine position were symmetrical in appearance, no palpable masses or tenderness,  no skin retraction, no nipple inversion, no nipple discharge, no skin discoloration, no axillary or supraclavicular lymphadenopathy Abdomen: no palpable masses or tenderness, no rebound or guarding Extremities: no edema or skin discoloration or tenderness  Pelvic: Vulva: Normal             Vagina: No gross lesions or discharge  Cervix: No gross lesions or discharge.  Pap/HPV HR done  Uterus  AV, normal size, shape and consistency, non-tender and mobile  Adnexa  Without masses or tenderness  Anus: Normal   Assessment/Plan:  52 y.o. female for annual exam  1. Encounter for gynecological examination with abnormal finding Normal Gynecologic exam in menopause.  Pap with high-risk HPV done today.  Breast exam negative status post bilateral mastectomy.  Health labs with Dr. Jana Hakim.  2. Menopause present Well on no HRT.  No PMB.  Vitamin D supplements, calcium rich nutrition and regular weightbearing physical activity recommended. - VITAMIN D 25 Hydroxy (Vit-D Deficiency, Fractures) - DG Bone Density; Future  3. Post-menopausal atrophic vaginitis Coconut oil recommended.  4. Screening for  osteoporosis Vitamin D supplements, calcium rich nutrition and regular weightbearing physical activity recommended.  Will do bone density here now.  5. Special screening examination for human papillomavirus (HPV)  - PAP,TP IMGw/HPV RNA,rflx [id number],62/37  6. Malignant neoplasm of upper-outer quadrant of left breast in female, estrogen receptor positive ([place]) S/P Bilateral Mastectomy.  On Tamoxifen.  Followed by Dr Jana Hakim.  Princess Bruins MD, 2:44 PM 05/20/2017

## 2017-05-22 ENCOUNTER — Other Ambulatory Visit: Payer: Self-pay | Admitting: Gynecology

## 2017-05-22 ENCOUNTER — Other Ambulatory Visit: Payer: Self-pay | Admitting: Obstetrics & Gynecology

## 2017-05-22 DIAGNOSIS — Z13820 Encounter for screening for osteoporosis: Secondary | ICD-10-CM

## 2017-05-22 LAB — PAP, TP IMAGING W/ HPV RNA, RFLX HPV TYPE 16,18/45: HPV DNA HIGH RISK: NOT DETECTED

## 2017-05-25 ENCOUNTER — Encounter: Payer: Self-pay | Admitting: Obstetrics & Gynecology

## 2017-05-25 NOTE — Patient Instructions (Signed)
1. Encounter for gynecological examination with abnormal finding Normal Gynecologic exam in menopause.  Pap with high-risk HPV done today.  Breast exam negative status post bilateral mastectomy.  Health labs with Dr. Jana Hakim.  2. Menopause present Well on no HRT.  No PMB.  Vitamin D supplements, calcium rich nutrition and regular weightbearing physical activity recommended. - VITAMIN D 25 Hydroxy (Vit-D Deficiency, Fractures) - DG Bone Density; Future  3. Post-menopausal atrophic vaginitis Coconut oil recommended.  4. Screening for osteoporosis Vitamin D supplements, calcium rich nutrition and regular weightbearing physical activity recommended.  Will do bone density here now.  5. Special screening examination for human papillomavirus (HPV)  - PAP,TP IMGw/HPV RNA,rflx [id number]/89  6. Malignant neoplasm of upper-outer quadrant of left breast in female, estrogen receptor positive ([place]) S/P Bilateral Mastectomy.  On Tamoxifen.  Followed by Dr Jana Hakim.  Bracha, it was a pleasure seeing you today!  I will inform you of your results as soon as they are available.   Health Maintenance for Postmenopausal Women Menopause is a normal process in which your reproductive ability comes to an end. This process happens gradually over a span of months to years, usually between the ages of 27 and 75. Menopause is complete when you have missed 12 consecutive menstrual periods. It is important to talk with your health care provider about some of the most common conditions that affect postmenopausal women, such as heart disease, cancer, and bone loss (osteoporosis). Adopting a healthy lifestyle and getting preventive care can help to promote your health and wellness. Those actions can also lower your chances of developing some of these common conditions. What should I know about menopause? During menopause, you may experience a number of symptoms, such as:  Moderate-to-severe hot flashes.  Night  sweats.  Decrease in sex drive.  Mood swings.  Headaches.  Tiredness.  Irritability.  Memory problems.  Insomnia.  Choosing to treat or not to treat menopausal changes is an individual decision that you make with your health care provider. What should I know about hormone replacement therapy and supplements? Hormone therapy products are effective for treating symptoms that are associated with menopause, such as hot flashes and night sweats. Hormone replacement carries certain risks, especially as you become older. If you are thinking about using estrogen or estrogen with progestin treatments, discuss the benefits and risks with your health care provider. What should I know about heart disease and stroke? Heart disease, heart attack, and stroke become more likely as you age. This may be due, in part, to the hormonal changes that your body experiences during menopause. These can affect how your body processes dietary fats, triglycerides, and cholesterol. Heart attack and stroke are both medical emergencies. There are many things that you can do to help prevent heart disease and stroke:  Have your blood pressure checked at least every 1-2 years. High blood pressure causes heart disease and increases the risk of stroke.  If you are 70-53 years old, ask your health care provider if you should take aspirin to prevent a heart attack or a stroke.  Do not use any tobacco products, including cigarettes, chewing tobacco, or electronic cigarettes. If you need help quitting, ask your health care provider.  It is important to eat a healthy diet and maintain a healthy weight. ? Be sure to include plenty of vegetables, fruits, low-fat dairy products, and lean protein. ? Avoid eating foods that are high in solid fats, added sugars, or salt (sodium).  Get regular exercise. This  is one of the most important things that you can do for your health. ? Try to exercise for at least 150 minutes each week.  The type of exercise that you do should increase your heart rate and make you sweat. This is known as moderate-intensity exercise. ? Try to do strengthening exercises at least twice each week. Do these in addition to the moderate-intensity exercise.  Know your numbers.Ask your health care provider to check your cholesterol and your blood glucose. Continue to have your blood tested as directed by your health care provider.  What should I know about cancer screening? There are several types of cancer. Take the following steps to reduce your risk and to catch any cancer development as early as possible. Breast Cancer  Practice breast self-awareness. ? This means understanding how your breasts normally appear and feel. ? It also means doing regular breast self-exams. Let your health care provider know about any changes, no matter how small.  If you are 18 or older, have a clinician do a breast exam (clinical breast exam or CBE) every year. Depending on your age, family history, and medical history, it may be recommended that you also have a yearly breast X-ray (mammogram).  If you have a family history of breast cancer, talk with your health care provider about genetic screening.  If you are at high risk for breast cancer, talk with your health care provider about having an MRI and a mammogram every year.  Breast cancer (BRCA) gene test is recommended for women who have family members with BRCA-related cancers. Results of the assessment will determine the need for genetic counseling and BRCA1 and for BRCA2 testing. BRCA-related cancers include these types: ? Breast. This occurs in males or females. ? Ovarian. ? Tubal. This may also be called fallopian tube cancer. ? Cancer of the abdominal or pelvic lining (peritoneal cancer). ? Prostate. ? Pancreatic.  Cervical, Uterine, and Ovarian Cancer Your health care provider may recommend that you be screened regularly for cancer of the pelvic  organs. These include your ovaries, uterus, and vagina. This screening involves a pelvic exam, which includes checking for microscopic changes to the surface of your cervix (Pap test).  For women ages 21-65, health care providers may recommend a pelvic exam and a Pap test every three years. For women ages 45-65, they may recommend the Pap test and pelvic exam, combined with testing for human papilloma virus (HPV), every five years. Some types of HPV increase your risk of cervical cancer. Testing for HPV may also be done on women of any age who have unclear Pap test results.  Other health care providers may not recommend any screening for nonpregnant women who are considered low risk for pelvic cancer and have no symptoms. Ask your health care provider if a screening pelvic exam is right for you.  If you have had past treatment for cervical cancer or a condition that could lead to cancer, you need Pap tests and screening for cancer for at least 20 years after your treatment. If Pap tests have been discontinued for you, your risk factors (such as having a new sexual partner) need to be reassessed to determine if you should start having screenings again. Some women have medical problems that increase the chance of getting cervical cancer. In these cases, your health care provider may recommend that you have screening and Pap tests more often.  If you have a family history of uterine cancer or ovarian cancer, talk with your  health care provider about genetic screening.  If you have vaginal bleeding after reaching menopause, tell your health care provider.  There are currently no reliable tests available to screen for ovarian cancer.  Lung Cancer Lung cancer screening is recommended for adults 55-80 years old who are at high risk for lung cancer because of a history of smoking. A yearly low-dose CT scan of the lungs is recommended if you:  Currently smoke.  Have a history of at least 30 pack-years of  smoking and you currently smoke or have quit within the past 15 years. A pack-year is smoking an average of one pack of cigarettes per day for one year.  Yearly screening should:  Continue until it has been 15 years since you quit.  Stop if you develop a health problem that would prevent you from having lung cancer treatment.  Colorectal Cancer  This type of cancer can be detected and can often be prevented.  Routine colorectal cancer screening usually begins at age 50 and continues through age 75.  If you have risk factors for colon cancer, your health care provider may recommend that you be screened at an earlier age.  If you have a family history of colorectal cancer, talk with your health care provider about genetic screening.  Your health care provider may also recommend using home test kits to check for hidden blood in your stool.  A small camera at the end of a tube can be used to examine your colon directly (sigmoidoscopy or colonoscopy). This is done to check for the earliest forms of colorectal cancer.  Direct examination of the colon should be repeated every 5-10 years until age 75. However, if early forms of precancerous polyps or small growths are found or if you have a family history or genetic risk for colorectal cancer, you may need to be screened more often.  Skin Cancer  Check your skin from head to toe regularly.  Monitor any moles. Be sure to tell your health care provider: ? About any new moles or changes in moles, especially if there is a change in a mole's shape or color. ? If you have a mole that is larger than the size of a pencil eraser.  If any of your family members has a history of skin cancer, especially at a young age, talk with your health care provider about genetic screening.  Always use sunscreen. Apply sunscreen liberally and repeatedly throughout the day.  Whenever you are outside, protect yourself by wearing long sleeves, pants, a wide-brimmed  hat, and sunglasses.  What should I know about osteoporosis? Osteoporosis is a condition in which bone destruction happens more quickly than new bone creation. After menopause, you may be at an increased risk for osteoporosis. To help prevent osteoporosis or the bone fractures that can happen because of osteoporosis, the following is recommended:  If you are 19-50 years old, get at least 1,000 mg of calcium and at least 600 mg of vitamin D per day.  If you are older than age 50 but younger than age 70, get at least 1,200 mg of calcium and at least 600 mg of vitamin D per day.  If you are older than age 70, get at least 1,200 mg of calcium and at least 800 mg of vitamin D per day.  Smoking and excessive alcohol intake increase the risk of osteoporosis. Eat foods that are rich in calcium and vitamin D, and do weight-bearing exercises several times each week as directed   by your health care provider. What should I know about how menopause affects my mental health? Depression may occur at any age, but it is more common as you become older. Common symptoms of depression include:  Low or sad mood.  Changes in sleep patterns.  Changes in appetite or eating patterns.  Feeling an overall lack of motivation or enjoyment of activities that you previously enjoyed.  Frequent crying spells.  Talk with your health care provider if you think that you are experiencing depression. What should I know about immunizations? It is important that you get and maintain your immunizations. These include:  Tetanus, diphtheria, and pertussis (Tdap) booster vaccine.  Influenza every year before the flu season begins.  Pneumonia vaccine.  Shingles vaccine.  Your health care provider may also recommend other immunizations. This information is not intended to replace advice given to you by your health care provider. Make sure you discuss any questions you have with your health care provider. Document Released:  04/12/2005 Document Revised: 09/08/2015 Document Reviewed: 11/22/2014 Elsevier Interactive Patient Education  2018 Elsevier Inc.  

## 2017-06-02 DIAGNOSIS — M8580 Other specified disorders of bone density and structure, unspecified site: Secondary | ICD-10-CM

## 2017-06-02 HISTORY — DX: Other specified disorders of bone density and structure, unspecified site: M85.80

## 2017-06-03 ENCOUNTER — Encounter: Payer: Self-pay | Admitting: Gynecology

## 2017-06-03 ENCOUNTER — Other Ambulatory Visit: Payer: Self-pay | Admitting: Gynecology

## 2017-06-03 ENCOUNTER — Ambulatory Visit (INDEPENDENT_AMBULATORY_CARE_PROVIDER_SITE_OTHER): Payer: BLUE CROSS/BLUE SHIELD

## 2017-06-03 DIAGNOSIS — Z13820 Encounter for screening for osteoporosis: Secondary | ICD-10-CM

## 2017-06-03 DIAGNOSIS — M8589 Other specified disorders of bone density and structure, multiple sites: Secondary | ICD-10-CM

## 2017-11-17 ENCOUNTER — Other Ambulatory Visit: Payer: Self-pay | Admitting: Oncology

## 2018-03-10 ENCOUNTER — Inpatient Hospital Stay: Payer: BLUE CROSS/BLUE SHIELD | Attending: Oncology

## 2018-03-10 DIAGNOSIS — Z9221 Personal history of antineoplastic chemotherapy: Secondary | ICD-10-CM | POA: Diagnosis not present

## 2018-03-10 DIAGNOSIS — Z9013 Acquired absence of bilateral breasts and nipples: Secondary | ICD-10-CM | POA: Insufficient documentation

## 2018-03-10 DIAGNOSIS — Z79810 Long term (current) use of selective estrogen receptor modulators (SERMs): Secondary | ICD-10-CM | POA: Insufficient documentation

## 2018-03-10 DIAGNOSIS — Z170 Estrogen receptor positive status [ER+]: Secondary | ICD-10-CM | POA: Diagnosis not present

## 2018-03-10 DIAGNOSIS — C50412 Malignant neoplasm of upper-outer quadrant of left female breast: Secondary | ICD-10-CM | POA: Diagnosis not present

## 2018-03-10 DIAGNOSIS — Z79899 Other long term (current) drug therapy: Secondary | ICD-10-CM | POA: Diagnosis not present

## 2018-03-10 DIAGNOSIS — Z87891 Personal history of nicotine dependence: Secondary | ICD-10-CM | POA: Insufficient documentation

## 2018-03-10 DIAGNOSIS — K862 Cyst of pancreas: Secondary | ICD-10-CM | POA: Diagnosis not present

## 2018-03-10 DIAGNOSIS — Z923 Personal history of irradiation: Secondary | ICD-10-CM | POA: Insufficient documentation

## 2018-03-10 LAB — COMPREHENSIVE METABOLIC PANEL
ALBUMIN: 3.5 g/dL (ref 3.5–5.0)
ALT: 21 U/L (ref 0–44)
AST: 16 U/L (ref 15–41)
Alkaline Phosphatase: 84 U/L (ref 38–126)
Anion gap: 10 (ref 5–15)
BUN: 13 mg/dL (ref 6–20)
CHLORIDE: 108 mmol/L (ref 98–111)
CO2: 21 mmol/L — ABNORMAL LOW (ref 22–32)
CREATININE: 0.78 mg/dL (ref 0.44–1.00)
Calcium: 9.3 mg/dL (ref 8.9–10.3)
GFR calc Af Amer: >60 mL/min (ref >60)
GFR calc non Af Amer: >60 mL/min (ref >60)
GLUCOSE: 105 mg/dL — AB (ref 70–99)
POTASSIUM: 4.2 mmol/L (ref 3.5–5.1)
Sodium: 139 mmol/L (ref 135–145)
Total Bilirubin: 0.4 mg/dL (ref 0.3–1.2)
Total Protein: 6.9 g/dL (ref 6.5–8.1)

## 2018-03-10 LAB — CBC WITH DIFFERENTIAL/PLATELET
ABS IMMATURE GRANULOCYTES: 0.01 10*3/uL (ref 0.00–0.07)
Basophils Absolute: 0.1 10*3/uL (ref 0.0–0.1)
Basophils Relative: 1 %
EOS PCT: 2 %
Eosinophils Absolute: 0.2 10*3/uL (ref 0.0–0.5)
HEMATOCRIT: 40.7 % (ref 36.0–46.0)
HEMOGLOBIN: 13.5 g/dL (ref 12.0–15.0)
Immature Granulocytes: 0 %
LYMPHS PCT: 27 %
Lymphs Abs: 1.9 10*3/uL (ref 0.7–4.0)
MCH: 28.7 pg (ref 26.0–34.0)
MCHC: 33.2 g/dL (ref 30.0–36.0)
MCV: 86.4 fL (ref 80.0–100.0)
MONO ABS: 0.5 10*3/uL (ref 0.1–1.0)
MONOS PCT: 8 %
NEUTROS ABS: 4.3 10*3/uL (ref 1.7–7.7)
Neutrophils Relative %: 62 %
Platelets: 223 10*3/uL (ref 150–400)
RBC: 4.71 MIL/uL (ref 3.87–5.11)
RDW: 12.2 % (ref 11.5–15.5)
WBC: 6.9 10*3/uL (ref 4.0–10.5)
nRBC: 0 % (ref 0.0–0.2)

## 2018-03-16 NOTE — Progress Notes (Signed)
[facility]  Telephone:([phone] Fax:([phone]) [phone]   ID: Lori [name] DOB: 10/25/1965  MR#: [id number]  WVP#:[id number]  Patient Care Team: Delilah Shan, MD as PCP - General (Family Medicine) Jovita Kussmaul, MD as Consulting Physician (General Surgery) Magrinat, Virgie Dad, MD as Consulting Physician (Oncology) [name], MD as Attending Physician (Radiation Oncology) [name], RN as Registered Nurse [name], RN as Registered Nurse [name], NP (Inactive) as Nurse Practitioner (Nurse Practitioner) Sylvan Cheese, NP as Nurse Practitioner (Hematology and Oncology) GYN: [name] MD OTHER MD:   CHIEF COMPLAINT: Estrogen receptor positive breast cancer; pancreatic lesion  CURRENT TREATMENT: Tamoxifen   BREAST CANCER HISTORY: From the original intake note:  Lori [name] (who is the daughter of my former patient, Lori [name], herself now 10 years out from her T1 cN0 invasive ductal carcinoma, treated with anti-estrogens only) [facility] April 20 03/23/2014. Breast density was category C. A possible mass in the left breast upper outer quadrant was felt to warrant further evaluation, and on 07/04/2014 the patient underwent left mammography with tomosynthesis and left breast ultrasonography. At this point the patient felt she was able to palpate a mass in the area in question. Tomosynthesis did reveal an irregular mass in the upper outer left breast measuring 2.3 cm, associated on physical exam with a large area of thickening. Ultrasound of the area in question confirmed an irregular mass at the 1:00 position 5 cm from the nipple measuring 2.2 cm. The left axilla was negative sonographically.  Biopsy of the mass in question the same day, 07/04/2014, showed (SAA [phone]) an invasive lobular breast cancer which was estrogen receptor 50% positive with strong staining intensity,  progesterone receptor 1% positive with moderate staining intensity, with a proliferation marker of 1%, and no HER-2 amplification, the signals ratio being 1.19 and the number per cell 1.55.  On 07/08/2014 the patient underwent bilateral breast MRI. This showed no abnormal adenopathy and no abnormality in the right breast. In the left breast however there was a 9.3 area of abnormal enhancement involving all quadrants. Within the upper outer quadrant there was a denser area which measured up to 5.1 cm.  The patient's subsequent history is as detailed below   INTERVAL HISTORY: Lori [name] returns today for follow-up and treatment of her estrogen receptor positive breast cancer.   The patient continues on tamoxifen. She has hot flashes, especially at night, without any support from medication. She doesn't have any vaginal wetness or stickiness.   Lori [name]'s last bone density screening on 06/03/2017, showed a T-score between -1.0 and -2.5, which is considered osteopenic.    She also gets abdominal MRIs, to follow-up her pancreatic lesion.  The last one was obtained 03/10/2017.   REVIEW OF SYSTEMS: Lori [name] likes to go geocaching, an outdoors activity that involves finding hidden prizes via GPS. Outside of geocaching, she does not have a formal exercise routine and does not participate in much activity. She states that she has a knot on her right mandible on the inside; she talked about this with her dentist, but they said they didn't think it was of concern.  The patient denies unusual headaches, visual changes, nausea, vomiting, or dizziness. There has been no unusual cough, phlegm production, or pleurisy. This been no change in bowel or bladder habits. The patient denies unexplained fatigue or unexplained weight loss, bleeding, rash, or fever. A detailed review of systems was otherwise noncontributory.  PAST MEDICAL HISTORY: Past Medical History:  Diagnosis Date  . Arthritis    knees  . Cancer ([place])    . History of breast cancer 07/2014  . Osteopenia 06/2017   T score -1.3 FRAX 2.4% / 0.1%    PAST SURGICAL HISTORY: Past Surgical History:  Procedure Laterality Date  . BREAST SURGERY  2016   double mastectomy   . CESAREAN SECTION  03/03/2002; 07/18/2010   x 2  . KNEE ARTHROSCOPY Left 07/27/2013   Procedure: LEFT ARTHROSCOPY KNEE WITH MEDIAL MENISCAL DEBRIDEMENT;  Surgeon: Gearlean Alf, MD;  Location: [facility];  Service: Orthopedics;  Laterality: Left;  Marland Kitchen MASTECTOMY W/ SENTINEL NODE BIOPSY Bilateral 08/10/2014   Procedure: MASTECTOMY WITH SENTINEL LYMPH NODE BIOPSY;  Surgeon: [name], MD;  Location: [facility];  Service: General;  Laterality: Bilateral;  . PORT-A-CATH REMOVAL Right 06/01/2015   Procedure: REMOVAL PORT-A-CATH;  Surgeon: [name], MD;  Location: [facility];  Service: General;  Laterality: Right;  . PORTACATH PLACEMENT Right 09/07/2014   Procedure: INSERTION PORT-A-CATH;  Surgeon: [name], MD;  Location: [facility];  Service: General;  Laterality: Right;  . TUBAL LIGATION  07/18/2010    FAMILY HISTORY Family History  Problem Relation Age of Onset  . Breast cancer Mother 61  . Lymphoma Maternal Aunt 71  . Uterine cancer Paternal Aunt 15  . Uterine cancer Paternal Grandmother 62  . Ovarian cancer Other 43       mat great aunt through [facility]  . Ovarian cancer Maternal Grandmother 47  The patient's father died with congestive heart failure at the age of 42. The patient's mother is living, age 39. She was diagnosed with breast cancer at age 22. The patient's paternal grandmother was diagnosed with uterine cancer at age 54 and her daughter, the patient's paternal aunt, also with uterine cancer at age 57. On the mother's side one maternal aunt was diagnosed with lymphoma and the other with throat cancer. A maternal great aunt at age 12 was diagnosed with ovarian cancer.   GYNECOLOGIC HISTORY:  No LMP recorded. (Menstrual status: Other).   menarche age 24, first live birth age 36, which the patient is aware double as the risk of breast cancer. She is GX P2. She still having regular periods. 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 Her husband Lori [name] (goes by "[name]") works in Engineer, technical sales, although currently he is looking for work. Their children are Lori [name] 12 and Lori [name] 3. The patient is not a church attender    ADVANCED DIRECTIVES: Not in place   HEALTH MAINTENANCE: Social History   Tobacco Use  . Smoking status: Former Smoker    Packs/day: 0.00    Years: 8.00    Pack years: 0.00    Last attempt to quit: 07/20/2000    Years since quitting: 17.6  . Smokeless tobacco: Never Used  Substance Use Topics  . Alcohol use: Yes    Comment: occasionally  . Drug use: No     Colonoscopy:  PAP:  Bone density:  Lipid panel:  No Known Allergies  Current Outpatient Medications  Medication Sig Dispense Refill  . Magnesium-Calcium-Folic Acid [phone] MG TABS Take by mouth.    . tamoxifen (NOLVADEX) 20 MG tablet TAKE 1 TABLET DAILY 90 tablet 4   No current facility-administered medications for this visit.     OBJECTIVE: young White  woman who appears stated age  85:   03/17/18 0859  BP: 111/67  Pulse: 83  Resp: 20  Temp: 98.7 F (37.1 C)  SpO2: 97%     Body mass index is 38.87 kg/m.      ECOG FS 1  Sclerae unicteric, EOMs intact Oropharynx clear and moist No cervical or supraclavicular adenopathy Lungs no rales or rhonchi Heart regular rate and rhythm Abd soft, nontender, positive bowel sounds MSK no focal spinal tenderness, no upper extremity lymphedema Neuro: nonfocal, well oriented, appropriate affect Breasts: Status post bilateral mastectomies, no evidence of disease recurrence; both axillae are benign  LAB RESULTS:  INo results found for: SPEP, UPEP  CBC Latest Ref Rng & Units 03/10/2018 03/10/2017 01/30/2016  WBC 4.0  - 10.5 K/uL 6.9 7.1 5.8  Hemoglobin 12.0 - 15.0 g/dL 13.5 13.4 13.6  Hematocrit 36.0 - 46.0 % 40.7 40.2 40.0  Platelets 150 - 400 K/uL 223 194 229   CMP Latest Ref Rng & Units 03/10/2018 03/10/2017 01/30/2016  Glucose 70 - 99 mg/dL 105(H) 92 102  BUN 6 - 20 mg/dL 13 12 15.4  Creatinine 0.44 - 1.00 mg/dL 0.78 0.76 0.7  Sodium 135 - 145 mmol/L 139 140 139  Potassium 3.5 - 5.1 mmol/L 4.2 4.1 4.3  Chloride 98 - 111 mmol/L 108 107 -  CO2 22 - 32 mmol/L 21(L) 27 22  Calcium 8.9 - 10.3 mg/dL 9.3 9.1 9.4  Total Protein 6.5 - 8.1 g/dL 6.9 6.9 6.9  Total Bilirubin 0.3 - 1.2 mg/dL 0.4 0.3 0.34  Alkaline Phos 38 - 126 U/L 84 84 102  AST 15 - 41 U/L 16 22 20   ALT 0 - 44 U/L 21 41 38     STUDIES:No results found.  ASSESSMENT: 53 y.o. BRCA negative [place] woman status post left breast upper outer quadrant biopsy 07/04/2014 for a clinical T3 N0, stage IIA invasive lobular breast cancer, grade 2, estrogen receptor positive, progesterone receptor 1% "positive", with an MIB-1 of less than 5% and no HER-2 amplification  (1) genetics testing 07/29/2014 through the [employer] gene panel offered by [employer] found no deleterious mutations in ATM, BARD1, BRCA1, BRCA2, BRIP1, CDH1, CHEK2, EPCAM, MLH1, MRE11A, MSH2, MSH6, MUTYH, NBN, NF1, PALB2, PMS2, PTEN, RAD50, RAD51C, RAD51D, SMARCA4, STK11, or TP53.   (2) status post bilateral mastectomies 08/10/2014 showing  (a) on the right, lobular carcinoma in situ  (b) on the left, a pT3 pN1, stage IIIA invasive lobular breast cancer, HER-2 repeated and again negative  (3) chemotherapy started 10/11/2014,consisting of doxorubicin and cyclophosphamide in dose dense fashion 4, completed 11/22/2014, followed by paclitaxel weekly 6 started 12/06/2014. Abraxane given once, as cycle #7,and tolerated poorly. Completed 5 additional doses of paclitaxel 03/07/2015 (total 12 doses)  (4) radiation completed 03/30/2015-05/10/2015  1. Left chest wall and regional nodes / 50 Gy  in 25 fractions to chest wall , 45 Gy in 25 fractions to SCV/PAB  2. Left chest wall scar boost / 10 Gy in 5 fractions  (5) tamoxifen was started neoadjuvantly on 07/13/2014, was held during chemotherapy, was resumed   mid-March 2017  (6) 5 mm cystic lesion in the tail of the pancreas noted by CT scan of the abdomen and pelvis 08/31/2014  (a) repeat abdominal MRI 07/18/2015 show some growth of the lesion, to 1.4 cm. (c/w CT scan)  (b) repeat abdominal MRI 01/31/2016 is entirely unchanged; annual follow-up is advised  (c) abdominal MRI obtained 03/10/2017 found to the lesion to measure 1.8  cm, with no internal septations  (7) considering DIEP reconstruction    PLAN: Lori [name] is now 3-1/2 years out from definitive surgery for her breast cancer with no evidence of disease recurrence.  This is very favorable.  She is tolerating tamoxifen well and the plan will be to continue that a minimum of 5 years, possibly up to 10 years.  She is also receiving yearly MRIs because of her cystic pancreatic lesion.  We are going to obtain an MRCP and she requests an open MRI this time.  We will try to get that done within a month or so  The bump on the right maxilla is most likely an abscess.  She is going to be referred to an oral surgeon by her dentist and I am in agreement with that plan.  She has finally decided she does want reconstruction.  She will be meeting with Dr. Abelino Derrick at [facility] to discuss that further.  She had some questions regarding CBD oils.  I referred her to the [facility] information on that product, which is not regulated.  It does help her sleep she says.  She is still having problems with hot flashes.  She did not have good luck with either gabapentin or venlafaxine.  Otherwise she will see me again in 1 year.  She knows to call for any other issues that may develop before then.    Magrinat, Virgie Dad, MD  03/17/18 9:36 AM Medical Oncology and Hematology [facility] [address] [place], [place] [address] Tel. [phone]    Fax. [phone]  I, Jacqualyn Posey am acting as a Education administrator for [name], MD.   I, Lurline Del MD, have reviewed the above documentation for accuracy and completeness, and I agree with the above.

## 2018-03-17 ENCOUNTER — Telehealth: Payer: Self-pay | Admitting: Oncology

## 2018-03-17 ENCOUNTER — Other Ambulatory Visit: Payer: Self-pay | Admitting: *Deleted

## 2018-03-17 ENCOUNTER — Inpatient Hospital Stay (HOSPITAL_BASED_OUTPATIENT_CLINIC_OR_DEPARTMENT_OTHER): Payer: BLUE CROSS/BLUE SHIELD | Admitting: Oncology

## 2018-03-17 VITALS — BP 111/67 | HR 83 | Temp 98.7°F | Resp 20 | Ht 67.0 in | Wt 248.2 lb

## 2018-03-17 DIAGNOSIS — Z923 Personal history of irradiation: Secondary | ICD-10-CM

## 2018-03-17 DIAGNOSIS — Z9013 Acquired absence of bilateral breasts and nipples: Secondary | ICD-10-CM | POA: Diagnosis not present

## 2018-03-17 DIAGNOSIS — Z170 Estrogen receptor positive status [ER+]: Secondary | ICD-10-CM

## 2018-03-17 DIAGNOSIS — Z9221 Personal history of antineoplastic chemotherapy: Secondary | ICD-10-CM

## 2018-03-17 DIAGNOSIS — K862 Cyst of pancreas: Secondary | ICD-10-CM

## 2018-03-17 DIAGNOSIS — Z79899 Other long term (current) drug therapy: Secondary | ICD-10-CM

## 2018-03-17 DIAGNOSIS — Z87891 Personal history of nicotine dependence: Secondary | ICD-10-CM | POA: Diagnosis not present

## 2018-03-17 DIAGNOSIS — Z79810 Long term (current) use of selective estrogen receptor modulators (SERMs): Secondary | ICD-10-CM

## 2018-03-17 DIAGNOSIS — C50412 Malignant neoplasm of upper-outer quadrant of left female breast: Secondary | ICD-10-CM | POA: Diagnosis not present

## 2018-03-17 NOTE — Telephone Encounter (Signed)
Spoke with patient re contacting [facility] to schedule mri. Patient provided information for [facility].

## 2018-03-27 ENCOUNTER — Ambulatory Visit
Admission: RE | Admit: 2018-03-27 | Discharge: 2018-03-27 | Disposition: A | Discharge status: Home / Self Care | Payer: BLUE CROSS/BLUE SHIELD | Source: Ambulatory Visit | Attending: Oncology | Admitting: Oncology

## 2018-03-27 DIAGNOSIS — R932 Abnormal findings on diagnostic imaging of liver and biliary tract: Secondary | ICD-10-CM | POA: Diagnosis not present

## 2018-03-27 DIAGNOSIS — Z170 Estrogen receptor positive status [ER+]: Principal | ICD-10-CM

## 2018-03-27 DIAGNOSIS — C50412 Malignant neoplasm of upper-outer quadrant of left female breast: Secondary | ICD-10-CM

## 2018-03-27 DIAGNOSIS — K862 Cyst of pancreas: Secondary | ICD-10-CM | POA: Diagnosis not present

## 2018-03-27 MED ORDER — GADOBENATE DIMEGLUMINE 529 MG/ML IV SOLN
20.0000 mL | Freq: Once | INTRAVENOUS | Status: AC | PRN
  Administered 2018-03-27: 20 mL via INTRAVENOUS

## 2018-03-31 ENCOUNTER — Other Ambulatory Visit: Payer: Self-pay | Admitting: Oncology

## 2018-04-02 ENCOUNTER — Other Ambulatory Visit: Payer: Self-pay | Admitting: Oncology

## 2018-04-02 NOTE — Progress Notes (Unsigned)
I called Lori [name] again and discussed the possibilities further.  She knows we can do further follow-up but now we do have a quarter of an inch change in really I think it is better to proceed to an ultrasound and aspiration if feasible.  That is also what she would prefer.  I will get GI involved.

## 2018-04-03 ENCOUNTER — Telehealth: Payer: Self-pay

## 2018-04-03 NOTE — Telephone Encounter (Signed)
Instructions sent via My Chart.  Left message on machine to call back

## 2018-04-03 NOTE — Telephone Encounter (Signed)
-----   Message from Milus Banister, MD sent at 04/03/2018  6:03 AM EST ----- Gus, Happy to help. We'll contact her.   Tyrel Lex, She needs upper EUS radial +/- linear, for enlarging pancreatic tail cyst, first available appt with myself or Dr. Rush Landmark.  Thanks   Wynetta Fines  ----- Message ----- From: [name], MD Sent: 04/02/2018  10:00 AM EST To: Milus Banister, MD  Hi Dan-- could you take a look at the recent MRCP on this patient? Ranell has a cystic pancretic lesion that has grown slightly. I discussed endoscopy eval and aspiration and she likes that option  Let me know what you think; I can put in a formal referral if needed  Thanks!  Gus

## 2018-04-03 NOTE — Telephone Encounter (Signed)
2/20 1130 am [facility] EUS

## 2018-04-06 NOTE — Telephone Encounter (Signed)
EUS scheduled, pt instructed and medications reviewed.  Patient instructions mailed to home.  Patient to call with any questions or concerns.  

## 2018-04-23 ENCOUNTER — Encounter (HOSPITAL_COMMUNITY): Payer: Self-pay | Admitting: *Deleted

## 2018-04-23 ENCOUNTER — Ambulatory Visit (HOSPITAL_COMMUNITY): Payer: BLUE CROSS/BLUE SHIELD | Admitting: Anesthesiology

## 2018-04-23 ENCOUNTER — Encounter (HOSPITAL_COMMUNITY)
Admission: RE | Disposition: A | Discharge status: Home / Self Care | Payer: Self-pay | Source: Home / Self Care | Attending: Gastroenterology

## 2018-04-23 ENCOUNTER — Other Ambulatory Visit: Payer: Self-pay

## 2018-04-23 ENCOUNTER — Ambulatory Visit (HOSPITAL_COMMUNITY)
Admission: RE | Admit: 2018-04-23 | Discharge: 2018-04-23 | Disposition: A | Discharge status: Home / Self Care | Payer: BLUE CROSS/BLUE SHIELD | Attending: Gastroenterology | Admitting: Gastroenterology

## 2018-04-23 DIAGNOSIS — K649 Unspecified hemorrhoids: Secondary | ICD-10-CM | POA: Diagnosis not present

## 2018-04-23 DIAGNOSIS — Z853 Personal history of malignant neoplasm of breast: Secondary | ICD-10-CM | POA: Insufficient documentation

## 2018-04-23 DIAGNOSIS — Z9013 Acquired absence of bilateral breasts and nipples: Secondary | ICD-10-CM | POA: Diagnosis not present

## 2018-04-23 DIAGNOSIS — K862 Cyst of pancreas: Secondary | ICD-10-CM

## 2018-04-23 DIAGNOSIS — Z87891 Personal history of nicotine dependence: Secondary | ICD-10-CM | POA: Insufficient documentation

## 2018-04-23 DIAGNOSIS — K5900 Constipation, unspecified: Secondary | ICD-10-CM | POA: Diagnosis not present

## 2018-04-23 HISTORY — PX: ESOPHAGOGASTRODUODENOSCOPY: SHX5428

## 2018-04-23 HISTORY — PX: EUS: SHX5427

## 2018-04-23 HISTORY — PX: FINE NEEDLE ASPIRATION: SHX6590

## 2018-04-23 LAB — PANC CYST FLD ANLYS-PATHFNDR-TG

## 2018-04-23 SURGERY — UPPER ENDOSCOPIC ULTRASOUND (EUS) RADIAL
Anesthesia: Monitor Anesthesia Care

## 2018-04-23 MED ORDER — PROPOFOL 10 MG/ML IV BOLUS
INTRAVENOUS | Status: AC
  Filled 2018-04-23: qty 40

## 2018-04-23 MED ORDER — PROPOFOL 500 MG/50ML IV EMUL
INTRAVENOUS | Status: DC | PRN
  Administered 2018-04-23 (×2): 30 mg via INTRAVENOUS

## 2018-04-23 MED ORDER — CIPROFLOXACIN HCL 500 MG PO TABS: 500.0000 mg | ORAL_TABLET | Freq: Two times a day (BID) | ORAL | 0 refills | Status: DC

## 2018-04-23 MED ORDER — LACTATED RINGERS IV SOLN
INTRAVENOUS | Status: DC
  Administered 2018-04-23: 11:00:00 via INTRAVENOUS

## 2018-04-23 MED ORDER — PROPOFOL 500 MG/50ML IV EMUL
INTRAVENOUS | Status: DC | PRN
  Administered 2018-04-23: 175 ug/kg/min via INTRAVENOUS

## 2018-04-23 MED ORDER — CIPROFLOXACIN IN D5W 400 MG/200ML IV SOLN
INTRAVENOUS | Status: AC
  Filled 2018-04-23: qty 200

## 2018-04-23 MED ORDER — CIPROFLOXACIN IN D5W 400 MG/200ML IV SOLN
INTRAVENOUS | Status: DC | PRN
  Administered 2018-04-23: 400 mg via INTRAVENOUS

## 2018-04-23 MED ORDER — PROPOFOL 10 MG/ML IV BOLUS
INTRAVENOUS | Status: AC
  Filled 2018-04-23: qty 20

## 2018-04-23 NOTE — Discharge Instructions (Signed)
YOU HAD AN ENDOSCOPIC PROCEDURE TODAY: Refer to the procedure report and other information in the discharge instructions given to you for any specific questions about what was found during the examination. If this information does not answer your questions, please call [place] office at [phone] to clarify.  ° °YOU SHOULD EXPECT: Some feelings of bloating in the abdomen. Passage of more gas than usual. Walking can help get rid of the air that was put into your GI tract during the procedure and reduce the bloating. If you had a lower endoscopy (such as a colonoscopy or flexible sigmoidoscopy) you may notice spotting of blood in your stool or on the toilet paper. Some abdominal soreness may be present for a day or two, also. ° °DIET: Your first meal following the procedure should be a light meal and then it is ok to progress to your normal diet. A half-sandwich or bowl of soup is an example of a good first meal. Heavy or fried foods are harder to digest and may make you feel nauseous or bloated. Drink plenty of fluids but you should avoid alcoholic beverages for 24 hours. If you had a esophageal dilation, please see attached instructions for diet.   ° °ACTIVITY: Your care partner should take you home directly after the procedure. You should plan to take it easy, moving slowly for the rest of the day. You can resume normal activity the day after the procedure however YOU SHOULD NOT DRIVE, use power tools, machinery or perform tasks that involve climbing or major physical exertion for 24 hours (because of the sedation medicines used during the test).  ° °SYMPTOMS TO REPORT IMMEDIATELY: °A gastroenterologist can be reached at any hour. Please call [phone]  for any of the following symptoms:  °Following lower endoscopy (colonoscopy, flexible sigmoidoscopy) °Excessive amounts of blood in the stool  °Significant tenderness, worsening of abdominal pains  °Swelling of the abdomen that is new, acute  °Fever of 100° or  higher  °Following upper endoscopy (EGD, EUS, ERCP, esophageal dilation) °Vomiting of blood or coffee ground material  °New, significant abdominal pain  °New, significant chest pain or pain under the shoulder blades  °Painful or persistently difficult swallowing  °New shortness of breath  °Black, tarry-looking or red, bloody stools ° °FOLLOW UP:  °If any biopsies were taken you will be contacted by phone or by letter within the next 1-3 weeks. Call [phone]  if you have not heard about the biopsies in 3 weeks.  °Please also call with any specific questions about appointments or follow up tests. ° °

## 2018-04-23 NOTE — Anesthesia Postprocedure Evaluation (Signed)
Anesthesia Post Note  Patient: Lori [name]  Procedure(s) Performed: UPPER ENDOSCOPIC ULTRASOUND (EUS) RADIAL (N/A )     Patient location during evaluation: PACU Anesthesia Type: MAC Level of consciousness: awake and alert Pain management: pain level controlled Vital Signs Assessment: post-procedure vital signs reviewed and stable Respiratory status: spontaneous breathing, nonlabored ventilation, respiratory function stable and patient connected to nasal cannula oxygen Cardiovascular status: stable and blood pressure returned to baseline Postop Assessment: no apparent nausea or vomiting Anesthetic complications: no    Last Vitals:  Vitals:   04/23/18 1152 04/23/18 1202  BP: (!) 132/54 (!) 132/55  Pulse: 83 72  Resp: 14 17  Temp: 36.4 C   SpO2: 97% 100%    Last Pain:  Vitals:   04/23/18 1202  TempSrc:   PainSc: 0-No pain                 Jaicee Michelotti S

## 2018-04-23 NOTE — Transfer of Care (Signed)
Immediate Anesthesia Transfer of Care Note  Patient: Natividad Brood  Procedure(s) Performed: UPPER ENDOSCOPIC ULTRASOUND (EUS) RADIAL (N/A )  Patient Location: PACU  Anesthesia Type:MAC  Level of Consciousness: awake, alert  and oriented  Airway & Oxygen Therapy: Patient Spontanous Breathing and Patient connected to nasal cannula oxygen  Post-op Assessment: Report given to RN and Post -op Vital signs reviewed and stable  Post vital signs: Reviewed and stable  Last Vitals:  Vitals Value Taken Time  BP    Temp    Pulse 82 04/23/2018 11:50 AM  Resp 14 04/23/2018 11:50 AM  SpO2 94 % 04/23/2018 11:50 AM  Vitals shown include unvalidated device data.  Last Pain:  Vitals:   04/23/18 1010  TempSrc: Oral  PainSc: 0-No pain         Complications: No apparent anesthesia complications

## 2018-04-23 NOTE — Op Note (Signed)
[facility] Patient Name: Lori [name] Procedure Date: 04/23/2018 MRN: [id number] Attending MD: Milus Banister , MD Date of Birth: 18-Oct-1965 CSN: [id number] Age: 53 Admit Type: Outpatient Procedure:                Upper EUS Indications:              Incidental cyst in tail of pancreas; CT 2016 it was                            11mm, MRI 2017 1.4cm, MRI 2020 2.4cm (without                            enhancement, with normal main pancreatic duct) Providers:                Milus Banister, MD, Cleda Daub, RN, William Dalton, Technician Referring MD:             Tressa Busman, MD Medicines:                Monitored Anesthesia Care, Cipro 102 mg IV Complications:            No immediate complications. Estimated blood loss:                            None. Estimated Blood Loss:     Estimated blood loss: none. Procedure:                Pre-Anesthesia Assessment:                           - Prior to the procedure, a History and Physical                            was performed, and patient medications and                            allergies were reviewed. The patient's tolerance of                            previous anesthesia was also reviewed. The risks                            and benefits of the procedure and the sedation                            options and risks were discussed with the patient.                            All questions were answered, and informed consent                            was obtained. Prior Anticoagulants: The patient has  taken no previous anticoagulant or antiplatelet                            agents. ASA Grade Assessment: II - A patient with                            mild systemic disease. After reviewing the risks                            and benefits, the patient was deemed in                            satisfactory condition to undergo the procedure.        After obtaining informed consent, the endoscope was                            passed under direct vision. Throughout the                            procedure, the patient's blood pressure, pulse, and                            oxygen saturations were monitored continuously. The                            [id number] ([id number]) Olympus Radial EUS was                            introduced through the mouth, and advanced to the                            second part of duodenum. The upper EUS was                            accomplished without difficulty. The patient                            tolerated the procedure well. Scope In: Scope Out: Findings:      ENDOSCOPIC FINDING: :      The examined esophagus was endoscopically normal.      The entire examined stomach was endoscopically normal.      The examined duodenum was endoscopically normal.      ENDOSONOGRAPHIC FINDING: :      1. An anechoic lesion suggestive of a cyst was identified in the       pancreatic tail. It was not in obvious communication with the pancreatic       duct. The lesion measured 21 mm by 22 mm in maximal cross-sectional       diameter. There was a single compartment without septae. There was no       associated mass. There was internal debris within the fluid-filled       cavity. Diagnostic needle aspiration for fluid was performed. Color       Doppler imaging was utilized prior to needle puncture to confirm a lack  of significant vascular structures within the needle path. One pass was       made with the 22 gauge needle using a transgastric approach. The amount       of fluid collected was 5 mL. The fluid was clear and slightly viscous.       Sample(s) were sent for amylase concentration, histology and CEA.      2. The pancreatic parenchyma was otherwise normal.      3. No peripancreatic adenopathy.      4. Limited evaluation of the liver, spleen, portal and splenic vessels       were all normal.       5. CBD was non-dilated. Impression:               - Incidental cyst in the tail of pancreas without                            concerning morphologic features (no associated                            mass, cyst <3cm, normal main pancreatic duct). The                            cyst was completely aspirated with EUS FNA and the                            fluid was sent for testing (CEA, amylase and                            cytology). Moderate Sedation:      Not Applicable - Patient had care per Anesthesia. Recommendation:           - Discharge patient to home (ambulatory).                           - She will complete 3 days of twice daily cipro. Procedure Code(s):        --- Professional ---                           [phone], Esophagogastroduodenoscopy, flexible,                            transoral; with transendoscopic ultrasound-guided                            intramural or transmural fine needle                            aspiration/biopsy(s), (includes endoscopic                            ultrasound examination limited to the esophagus,                            stomach or duodenum, and adjacent structures) Diagnosis Code(s):        --- Professional ---  K86.2, Cyst of pancreas                           R93.3, Abnormal findings on diagnostic imaging of                            other parts of digestive tract CPT copyright 2018 American Medical Association. All rights reserved. The codes documented in this report are preliminary and upon coder review may  be revised to meet current compliance requirements. Milus Banister, MD 04/23/2018 11:55:58 AM This report has been signed electronically. Number of Addenda: 0

## 2018-04-23 NOTE — H&P (Signed)
HPI: This is a 53 yo woman  Chief complaint is incidental pancreatic tail cyst  ROS: complete GI ROS as described in HPI, all other review negative.  Constitutional:  No unintentional weight loss   Past Medical History:  Diagnosis Date  . Arthritis    knees  . Cancer ([place])   . History of breast cancer 07/2014  . Osteopenia 06/2017   T score -1.3 FRAX 2.4% / 0.1%    Past Surgical History:  Procedure Laterality Date  . BREAST SURGERY  2016   double mastectomy   . CESAREAN SECTION  03/03/2002; 07/18/2010   x 2  . KNEE ARTHROSCOPY Left 07/27/2013   Procedure: LEFT ARTHROSCOPY KNEE WITH MEDIAL MENISCAL DEBRIDEMENT;  Surgeon: Gearlean Alf, MD;  Location: [facility];  Service: Orthopedics;  Laterality: Left;  Marland Kitchen MASTECTOMY W/ SENTINEL NODE BIOPSY Bilateral 08/10/2014   Procedure: MASTECTOMY WITH SENTINEL LYMPH NODE BIOPSY;  Surgeon: [name], MD;  Location: [place];  Service: General;  Laterality: Bilateral;  . PORT-A-CATH REMOVAL Right 06/01/2015   Procedure: REMOVAL PORT-A-CATH;  Surgeon: [name], MD;  Location: [place];  Service: General;  Laterality: Right;  . PORTACATH PLACEMENT Right 09/07/2014   Procedure: INSERTION PORT-A-CATH;  Surgeon: [name], MD;  Location: [place];  Service: General;  Laterality: Right;  . TUBAL LIGATION  07/18/2010    Current Facility-Administered Medications  Medication Dose Route Frequency Provider Last Rate Last Dose  . lactated ringers infusion   Intravenous Continuous Milus Banister, MD        Allergies as of 04/03/2018  . (No Known Allergies)    Family History  Problem Relation Age of Onset  . Breast cancer Mother 34  . Lymphoma Maternal Aunt 71  . Uterine cancer Paternal Aunt 19  . Uterine cancer Paternal Grandmother 69  . Ovarian cancer Other 103       mat great aunt through [facility]  . Ovarian cancer Maternal Grandmother 97    Social History   Socioeconomic History  . Marital status:  Married    Spouse name: Not on file  . Number of children: Not on file  . Years of education: Not on file  . Highest education level: Not on file  Occupational History  . Not on file  Social Needs  . Financial resource strain: Not on file  . Food insecurity:    Worry: Not on file    Inability: Not on file  . Transportation needs:    Medical: Not on file    Non-medical: Not on file  Tobacco Use  . Smoking status: Former Smoker    Packs/day: 0.00    Years: 8.00    Pack years: 0.00    Last attempt to quit: 07/20/2000    Years since quitting: 17.7  . Smokeless tobacco: Never Used  Substance and Sexual Activity  . Alcohol use: Yes    Comment: occasionally  . Drug use: No  . Sexual activity: Yes    Partners: Male    Comment: 1st intercourse- 88, partner-1, married- 25 yrs   Lifestyle  . Physical activity:    Days per week: Not on file    Minutes per session: Not on file  . Stress: Not on file  Relationships  . Social connections:    Talks on phone: Not on file    Gets together: Not on file    Attends religious service: Not on file    Active member  of club or organization: Not on file    Attends meetings of clubs or organizations: Not on file    Relationship status: Not on file  . Intimate partner violence:    Fear of current or ex partner: Not on file    Emotionally abused: Not on file    Physically abused: Not on file    Forced sexual activity: Not on file  Other Topics Concern  . Not on file  Social History Narrative  . Not on file     Physical Exam: BP (!) 164/91   Pulse 91   Temp 98.8 F (37.1 C) (Oral)   Resp (!) 24   Ht 5\' 7"  (1.702 m)   Wt 112.5 kg   LMP 08/26/2014   SpO2 97%   BMI 38.84 kg/m  Constitutional: generally well-appearing Psychiatric: alert and oriented x3 Abdomen: soft, nontender, nondistended, no obvious ascites, no peritoneal signs, normal bowel sounds No peripheral edema noted in lower extremities  Assessment and plan: 53 y.o.  female with incidental pancreatic tail cyst  For upper EUS evaluation today.  Please see the "Patient Instructions" section for addition details about the plan.  Owens Loffler, MD [facility] 04/23/2018, 10:48 AM

## 2018-04-23 NOTE — Anesthesia Preprocedure Evaluation (Signed)
Anesthesia Evaluation  Patient identified by MRN, date of birth, ID band Patient awake    Reviewed: Allergy & Precautions, NPO status , Patient's Chart, lab work & pertinent test results  Airway Mallampati: II  TM Distance: >3 FB Neck ROM: Full    Dental no notable dental hx.    Pulmonary neg pulmonary ROS, former smoker,    Pulmonary exam normal breath sounds clear to auscultation       Cardiovascular negative cardio ROS Normal cardiovascular exam Rhythm:Regular Rate:Normal     Neuro/Psych negative neurological ROS  negative psych ROS   GI/Hepatic negative GI ROS, Neg liver ROS,   Endo/Other  negative endocrine ROS  Renal/GU negative Renal ROS  negative genitourinary   Musculoskeletal negative musculoskeletal ROS (+)   Abdominal   Peds negative pediatric ROS (+)  Hematology negative hematology ROS (+)   Anesthesia Other Findings   Reproductive/Obstetrics negative OB ROS                             Anesthesia Physical Anesthesia Plan  ASA: II  Anesthesia Plan: MAC   Post-op Pain Management:    Induction: Intravenous  PONV Risk Score and Plan: 0  Airway Management Planned: Nasal Cannula  Additional Equipment:   Intra-op Plan:   Post-operative Plan:   Informed Consent: I have reviewed the patients History and Physical, chart, labs and discussed the procedure including the risks, benefits and alternatives for the proposed anesthesia with the patient or authorized representative who has indicated his/her understanding and acceptance.     Dental advisory given  Plan Discussed with: CRNA and Surgeon  Anesthesia Plan Comments:         Anesthesia Quick Evaluation

## 2018-04-24 ENCOUNTER — Encounter (HOSPITAL_COMMUNITY): Payer: Self-pay | Admitting: Gastroenterology

## 2018-05-25 ENCOUNTER — Encounter: Payer: BLUE CROSS/BLUE SHIELD | Admitting: Obstetrics & Gynecology

## 2018-07-31 ENCOUNTER — Encounter: Payer: BLUE CROSS/BLUE SHIELD | Admitting: Obstetrics & Gynecology

## 2018-11-25 ENCOUNTER — Encounter: Payer: Self-pay | Admitting: Gynecology

## 2019-02-23 ENCOUNTER — Telehealth: Payer: Self-pay | Admitting: Oncology

## 2019-02-23 ENCOUNTER — Other Ambulatory Visit: Payer: Self-pay | Admitting: Oncology

## 2019-02-23 NOTE — Telephone Encounter (Signed)
Returned patient's phone call regarding cancelling January appointments, per patient's request appointment cancelled.

## 2019-03-22 ENCOUNTER — Other Ambulatory Visit: Payer: BLUE CROSS/BLUE SHIELD

## 2019-03-25 ENCOUNTER — Ambulatory Visit: Payer: BLUE CROSS/BLUE SHIELD | Admitting: Oncology

## 2019-11-12 ENCOUNTER — Other Ambulatory Visit: Payer: Self-pay | Admitting: Oncology

## 2019-11-15 ENCOUNTER — Telehealth: Payer: Self-pay | Admitting: Oncology

## 2019-11-15 NOTE — Telephone Encounter (Signed)
Scheduled apt per 9/13 sch msg - pt is aware of apt date and time

## 2020-03-02 ENCOUNTER — Ambulatory Visit: Payer: BC Managed Care – PPO | Attending: Internal Medicine

## 2020-03-02 DIAGNOSIS — Z23 Encounter for immunization: Secondary | ICD-10-CM

## 2020-03-02 NOTE — Progress Notes (Signed)
   Covid-19 Vaccination Clinic  Name:  Lori [name]    MRN: [id number] DOB: 02-25-1966  03/02/2020  [name] was observed post Covid-19 immunization for 15 minutes without incident. She was provided with Vaccine Information Sheet and instruction to access the V-Safe system.   [name] was instructed to call 911 with any severe reactions post vaccine: Marland Kitchen Difficulty breathing  . Swelling of face and throat  . A fast heartbeat  . A bad rash all over body  . Dizziness and weakness   Immunizations Administered    Name Date Dose VIS Date Route   Pfizer COVID-19 Vaccine 03/02/2020  2:24 PM 0.3 mL 12/22/2019 Intramuscular   Manufacturer: [employer], [employer]   Lot: [id number]   NDC: [address]-9

## 2020-03-13 ENCOUNTER — Telehealth: Payer: Self-pay

## 2020-03-13 DIAGNOSIS — K862 Cyst of pancreas: Secondary | ICD-10-CM

## 2020-03-13 NOTE — Telephone Encounter (Signed)
MRI scheduled for 03/21/20 at 12 noon at [facility] to arrive at 1130 am with nothing to eat or drink after midnight. The patient has been notified of this information and all questions answered.

## 2020-03-13 NOTE — Telephone Encounter (Signed)
-----   Message from Timothy Lasso, RN sent at 03/06/2020  8:17 AM EST -----  ----- Message ----- From: Timothy Lasso, RN Sent: 03/04/2020  12:00 AM EST To: Timothy Lasso, RN  MRI of the pancreas January 2022.

## 2020-03-15 NOTE — Progress Notes (Signed)
[place]  [phone] [phone]   ID: Lori [name] DOB: [birth year]  MR#: [id number]  KGS#:[id number]  Patient Care Team: Delilah Shan, MD as PCP - General (Family Medicine) Jovita Kussmaul, MD as Consulting Physician (General Surgery) Adryen Cookson, Virgie Dad, MD as Consulting Physician (Oncology) [name], MD as Attending Physician (Radiation Oncology) [name], RN as Registered Nurse [name], RN as Registered Nurse Princess Bruins, MD as Consulting Physician (Obstetrics and Gynecology) OTHER MD:  I connected with Natividad Brood on 03/16/20 at  1:00 PM EST by video enabled telemedicine visit and verified that I am speaking with the correct person using two identifiers.   I discussed the limitations, risks, security and privacy concerns of performing an evaluation and management service by telemedicine and the availability of in-person appointments. I also discussed with the patient that there may be a patient responsible charge related to this service. The patient expressed understanding and agreed to proceed.   Other persons participating in the visit and their role in the encounter: none  Patient's location: Home Provider's location: [place]: Estrogen receptor positive breast cancer (s/p bilateral mastectomies); pancreatic lesion  CURRENT TREATMENT: Completing 5 years tamoxifen   INTERVAL HISTORY: Lori [name] was contacted today for follow-up of her estrogen receptor positive breast cancer. She was last seen here in 03/2018.  The patient continues on tamoxifen.  She feels a little bit down and wonders to what extent tamoxifen contributes to that.  She also complains of decreased libido and tamoxifen certainly can worsen that problem.  Lori [name] last bone density screening on 06/03/2017, showed a T-score between -1.0 and -2.5, which is considered osteopenic.    She underwent follow up  abdomen MRI on 03/27/2018 showing interval growth of the cystic lesion in the tail of the pancreas.    She proceeded to upper endoscopic ultrasound with fine needle aspiration of the cyst on 04/23/2018 under Dr. Ardis Hughs. Cytology from the procedure ([id number]) showed: no malignant cells identified; findings consistent with contents of a cyst.  As recommended, she is scheduled for follow up abdomen MRI on 03/21/2020.   REVIEW OF SYSTEMS: Lori [name] tells me they pretty much stayed home for the holidays.  They are very concerned about the current pandemic status and would like to have her MRI moved back to see if the omicron variant blows over.  A detailed review of systems today was otherwise noncontributory   COVID 19 VACCINATION STATUS: fully vaccinated, with booster 03/02/2020   BREAST CANCER HISTORY: From the original intake note:  Lori [name] (who is the daughter of my former patient, Lori [name], herself now 10 years out from her T1 cN0 invasive ductal carcinoma, treated with anti-estrogens only) [facility] April 20 03/23/2014. Breast density was category C. A possible mass in the left breast upper outer quadrant was felt to warrant further evaluation, and on 07/04/2014 the patient underwent left mammography with tomosynthesis and left breast ultrasonography. At this point the patient felt she was able to palpate a mass in the area in question. Tomosynthesis did reveal an irregular mass in the upper outer left breast measuring 2.3 cm, associated on physical exam with a large area of thickening. Ultrasound of the area in question confirmed an irregular mass at the 1:00 position 5 cm from the nipple measuring 2.2 cm. The left axilla was negative sonographically.  Biopsy of the mass in question the  same day, 07/04/2014, showed (SAA [id number]) an invasive lobular breast cancer which was estrogen receptor 50% positive with strong staining intensity, progesterone  receptor 1% positive with moderate staining intensity, with a proliferation marker of 1%, and no HER-2 amplification, the signals ratio being 1.19 and the number per cell 1.55.  On 07/08/2014 the patient underwent bilateral breast MRI. This showed no abnormal adenopathy and no abnormality in the right breast. In the left breast however there was a 9.3 area of abnormal enhancement involving all quadrants. Within the upper outer quadrant there was a denser area which measured up to 5.1 cm.  The patient's subsequent history is as detailed below   PAST MEDICAL HISTORY: Past Medical History:  Diagnosis Date  . Arthritis    knees  . Cancer ([place])   . History of breast cancer 07/2014  . Osteopenia 06/2017   T score -1.3 FRAX 2.4% / 0.1%    PAST SURGICAL HISTORY: Past Surgical History:  Procedure Laterality Date  . BREAST SURGERY  2016   double mastectomy   . CESAREAN SECTION  03/03/2002; 07/18/2010   x 2  . ESOPHAGOGASTRODUODENOSCOPY N/A 04/23/2018   Procedure: ESOPHAGOGASTRODUODENOSCOPY (EGD);  Surgeon: Milus Banister, MD;  Location: [facility];  Service: Endoscopy;  Laterality: N/A;  . EUS N/A 04/23/2018   Procedure: UPPER ENDOSCOPIC ULTRASOUND (EUS) RADIAL;  Surgeon: Milus Banister, MD;  [facility];  Service: Endoscopy;  Laterality: N/A;  . FINE NEEDLE ASPIRATION  04/23/2018   Procedure: FINE NEEDLE ASPIRATION;  Surgeon: Milus Banister, MD;  [facility];  Service: Endoscopy;;  . KNEE ARTHROSCOPY Left 07/27/2013   Procedure: LEFT ARTHROSCOPY KNEE WITH MEDIAL MENISCAL DEBRIDEMENT;  Surgeon: Gearlean Alf, MD;  [facility];  Service: Orthopedics;  Laterality: Left;  Marland Kitchen MASTECTOMY W/ SENTINEL NODE BIOPSY Bilateral 08/10/2014   Procedure: MASTECTOMY WITH SENTINEL LYMPH NODE BIOPSY;  Surgeon: [name], MD;  Location: [facility];  Service: General;  Laterality: Bilateral;  . PORT-A-CATH REMOVAL Right 06/01/2015   Procedure: REMOVAL PORT-A-CATH;  Surgeon: Autumn Messing  III, MD;  Location: [facility];  Service: General;  Laterality: Right;  . PORTACATH PLACEMENT Right 09/07/2014   Procedure: INSERTION PORT-A-CATH;  Surgeon: [name], MD;  Location: [facility];  Service: General;  Laterality: Right;  . TUBAL LIGATION  07/18/2010    FAMILY HISTORY Family History  Problem Relation Age of Onset  . Breast cancer Mother 44  . Lymphoma Maternal Aunt 71  . Uterine cancer Paternal Aunt 12  . Uterine cancer Paternal Grandmother 75  . Ovarian cancer Other 52       mat great aunt through [facility]  . Ovarian cancer Maternal Grandmother 55  The patient's father died with congestive heart failure at the age of 51. The patient's mother is living, age 82. She was diagnosed with breast cancer at age 60. The patient's paternal grandmother was diagnosed with uterine cancer at age 32 and her daughter, the patient's paternal aunt, also with uterine cancer at age 74. On the mother's side one maternal aunt was diagnosed with lymphoma and the other with throat cancer. A maternal great aunt at age 56 was diagnosed with ovarian cancer.    GYNECOLOGIC HISTORY:  Patient's last menstrual period was 08/26/2014.  menarche age 67, first live birth age 81, which the patient is aware double as the risk of breast cancer. She is GX P2. She still having regular periods. 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 Her husband JAUNITA MIKELS the third (goes by "[name]") works in Engineer, technical sales, although currently he is looking for work. Their children are [name] 12 and [name] 3. The patient is not a church attender    ADVANCED DIRECTIVES: Not in place   HEALTH MAINTENANCE: Social History   Tobacco Use  . Smoking status: Former Smoker    Packs/day: 0.00    Years: 8.00    Pack years: 0.00    Quit date: 07/20/2000    Years since quitting: 19.6  . Smokeless tobacco: Never Used  Vaping Use  . Vaping  Use: Never used  Substance Use Topics  . Alcohol use: Yes    Comment: occasionally  . Drug use: No     Colonoscopy:  PAP:  Bone density:  Lipid panel:  No Known Allergies  Current Outpatient Medications  Medication Sig Dispense Refill  . ciprofloxacin (CIPRO) 500 MG tablet Take 1 tablet (500 mg total) by mouth 2 (two) times daily. 6 tablet 0  . Magnesium-Calcium-Folic Acid 161-096-0 MG TABS Take 1 tablet by mouth 3 (three) times a week.     . Multiple Vitamin (MULTIVITAMIN WITH MINERALS) TABS tablet Take 1 tablet by mouth 3 (three) times a week.    . tamoxifen (NOLVADEX) 20 MG tablet TAKE 1 TABLET DAILY 90 tablet 3   No current facility-administered medications for this visit.    OBJECTIVE:   There were no vitals filed for this visit.   There is no height or weight on file to calculate BMI.      ECOG FS 1  Telemedicine visit 03/16/2020    LAB RESULTS:  INo results found for: SPEP, UPEP  CBC Latest Ref Rng & Units 03/10/2018 03/10/2017 01/30/2016  WBC 4.0 - 10.5 K/uL 6.9 7.1 5.8  Hemoglobin 12.0 - 15.0 g/dL 13.5 13.4 13.6  Hematocrit 36.0 - 46.0 % 40.7 40.2 40.0  Platelets 150 - 400 K/uL 223 194 229   CMP Latest Ref Rng & Units 03/10/2018 03/10/2017 01/30/2016  Glucose 70 - 99 mg/dL 105(H) 92 102  BUN 6 - 20 mg/dL 13 12 15.4  Creatinine 0.44 - 1.00 mg/dL 0.78 0.76 0.7  Sodium 135 - 145 mmol/L 139 140 139  Potassium 3.5 - 5.1 mmol/L 4.2 4.1 4.3  Chloride 98 - 111 mmol/L 108 107 -  CO2 22 - 32 mmol/L 21(L) 27 22  Calcium 8.9 - 10.3 mg/dL 9.3 9.1 9.4  Total Protein 6.5 - 8.1 g/dL 6.9 6.9 6.9  Total Bilirubin 0.3 - 1.2 mg/dL 0.4 0.3 0.34  Alkaline Phos 38 - 126 U/L 84 84 102  AST 15 - 41 U/L 16 22 20   ALT 0 - 44 U/L 21 41 38    STUDIES: No results found.   ASSESSMENT: 55 y.o. BRCA negative [place] woman status post left breast upper outer quadrant biopsy 07/04/2014 for a clinical T3 N0, stage IIA invasive lobular breast cancer, grade 2, estrogen receptor positive,  progesterone receptor 1% "positive", with an MIB-1 of less than 5% and no HER-2 amplification  (1) genetics testing 07/29/2014 through the [employer] gene panel offered by Al[facility] Forts found no deleterious mutations in ATM, BARD1, BRCA1, BRCA2, BRIP1, CDH1, CHEK2, EPCAM, MLH1, MRE11A, MSH2, MSH6, MUTYH, NBN, NF1, PALB2, PMS2, PTEN, RAD50, RAD51C, RAD51D, SMARCA4, STK11, or TP53.   (2) status post bilateral mastectomies 08/10/2014 showing  (a) on the right, lobular carcinoma in situ  (b) on  the left, a pT3 pN1, stage IIIA invasive lobular breast cancer, HER-2 repeated and again negative  (3) chemotherapy started 10/11/2014,consisting of doxorubicin and cyclophosphamide in dose dense fashion 4, completed 11/22/2014, followed by paclitaxel weekly 6 started 12/06/2014. Abraxane given once, as cycle #7,and tolerated poorly. Completed 5 additional doses of paclitaxel 03/07/2015 (total 12 doses)  (4) radiation completed 03/30/2015-05/10/2015  1. Left chest wall and regional nodes / 50 Gy in 25 fractions to chest wall , 45 Gy in 25 fractions to SCV/PAB  2. Left chest wall scar boost / 10 Gy in 5 fractions  (5) tamoxifen was started neoadjuvantly on 07/13/2014, was held during chemotherapy, was resumed  mid-March 2017, completing nearly 6 years April 2022  (6) 5 mm cystic lesion in the tail of the pancreas noted by CT scan of the abdomen and pelvis 08/31/2014  (a) repeat abdominal MRI 07/18/2015 show some growth of the lesion, to 1.4 cm. (c/w CT scan)  (b) repeat abdominal MRI 01/31/2016 is entirely unchanged; annual follow-up is advised  (c) abdominal MRI obtained 03/10/2017 found to the lesion to measure 1.8 cm, with no internal septations   PLAN: Lori [name] is now 5-1/2 years out from definitive surgery for her breast cancer with no evidence of disease recurrence.  This is very favorable.  We are stopping tamoxifen at this point, and since she has about 3 more months of pills on hand she will actually  finish in April, which means she will have taken tamoxifen just about 6 years  We had scheduled her for repeat MRI of the abdomen to follow the cystic lesion in her pancreas but she would like to move that to March or April because of the current pandemic status.  I am glad to do that for her.  She would like to be followed by gastroenterology for that.  She did see Dr. Ardis Hughs previously.  She would like to see someone in the [facility] area so we are referring her to Dr. Bryan Lemma for further follow-up.  At this point I feel comfortable releasing her to her primary care physicians.  She needs a yearly chest wall exam by a physician and that would be Dr. Dellis Filbert her gynecologist most likely.  I will be glad to see Lori [name] again at any point in the future if and when the need arises but as of now are making no further routine appointments for her here.  Lori [name], Virgie Dad, MD  03/16/20 1:35 PM Medical Oncology and Hematology [facility] [place], [place] [address] Tel. [phone]    Fax. [phone]   I, Wilburn Mylar, am acting as scribe for Dr. Virgie Dad. Lori [name].  I, Lurline Del MD, have reviewed the above documentation for accuracy and completeness, and I agree with the above.    *Total Encounter Time as defined by the Centers for Medicare and Medicaid Services includes, in addition to the face-to-face time of a patient visit (documented in the note above) non-face-to-face time: obtaining and reviewing outside history, ordering and reviewing medications, tests or procedures, care coordination (communications with other health care professionals or caregivers) and documentation in the medical record.

## 2020-03-16 ENCOUNTER — Inpatient Hospital Stay: Payer: BC Managed Care – PPO | Attending: Oncology | Admitting: Oncology

## 2020-03-16 DIAGNOSIS — K862 Cyst of pancreas: Secondary | ICD-10-CM

## 2020-03-20 ENCOUNTER — Telehealth: Payer: Self-pay | Admitting: Oncology

## 2020-03-20 NOTE — Telephone Encounter (Signed)
Made no changes to pt's schedule per 1/13 los.

## 2020-03-21 ENCOUNTER — Ambulatory Visit (HOSPITAL_COMMUNITY): Payer: BC Managed Care – PPO

## 2020-05-02 ENCOUNTER — Encounter: Payer: Self-pay | Admitting: Gastroenterology

## 2020-07-04 ENCOUNTER — Ambulatory Visit: Payer: BC Managed Care – PPO | Admitting: Gastroenterology

## 2020-07-04 ENCOUNTER — Encounter: Payer: Self-pay | Admitting: Gastroenterology

## 2020-07-04 VITALS — BP 120/82 | HR 88 | Ht 67.0 in | Wt 219.0 lb

## 2020-07-04 DIAGNOSIS — K862 Cyst of pancreas: Secondary | ICD-10-CM | POA: Diagnosis not present

## 2020-07-04 NOTE — Patient Instructions (Addendum)
If you are age 53 or younger, your body mass index should be between 19-25. Your Body mass index is 34.3 kg/m. If this is out of the aformentioned range listed, please consider follow up with your Primary Care Provider.   You have been scheduled for an MRI at [facility] on 07-12-2020. Your appointment time is 8am. Please arrive 15 minutes prior to your appointment time for registration purposes. Please make certain not to have anything to eat or drink 6 hours prior to your test. In addition, if you have any metal in your body, have a pacemaker or defibrillator, please be sure to let your ordering physician know. This test typically takes 45 minutes to 1 hour to complete. Should you need to reschedule, please call [phone] to do so.  Due to recent changes in healthcare laws, you may see the results of your imaging and laboratory studies on MyChart before your provider has had a chance to review them.  We understand that in some cases there may be results that are confusing or concerning to you. Not all laboratory results come back in the same time frame and the provider may be waiting for multiple results in order to interpret others.  Please give [place] 48 hours in order for your provider to thoroughly review all the results before contacting the office for clarification of your results.    Thank you for entrusting me with your care and choosing [facility].  Dr Ardis Hughs

## 2020-07-04 NOTE — Progress Notes (Signed)
Review of pertinent gastrointestinal problems: 1. Incidental cyst in tail of pancreas; CT 2016 it was 28mm, MRI 2017 1.4cm, MRI 2020 2.4cm (without enhancement, with normal main pancreatic duct).  Endoscopic ultrasound February 2020 2.1 cm x 2.2 cm simple cyst without associated mass, normal associated main pancreatic duct.  Pancreas otherwise normal.  Cyst fluid CEA 9.3, amylase 200,000, cytology no atypical appearing cells.  She was recommended to have repeat MRI 2 years later.   HPI: This is a very pleasant 55 year old woman whom I last saw at the time of a pancreatic cyst endoscopic ultrasound 2 years ago.  She has never been seen here in the office.  The cyst was incidentally discovered in 2016 when she was diagnosed with breast cancer.  Originally imaged by CT, she has had 2 MRIs since then and one endoscopic ultrasound.  The cyst fluid aspirate was all very innocent appearing.  She has never had acute pancreatitis, pancreatic cancer does not run in her family.  She does not have any serious abdominal pains.  She does not drink much alcohol at all.  She has intentionally lost 23 pounds in the past 6 or 8 months with significant dietary changes.   Review of systems: Pertinent positive and negative review of systems were noted in the above HPI section. All other review negative.   Past Medical History:  Diagnosis Date  . Arthritis    knees  . Cancer ([place])   . History of breast cancer 07/2014  . Osteopenia 06/2017   T score -1.3 FRAX 2.4% / 0.1%    Past Surgical History:  Procedure Laterality Date  . BREAST SURGERY  2016   double mastectomy   . CESAREAN SECTION  03/03/2002; 07/18/2010   x 2  . ESOPHAGOGASTRODUODENOSCOPY N/A 04/23/2018   Procedure: ESOPHAGOGASTRODUODENOSCOPY (EGD);  Surgeon: Milus Banister, MD;  Location: [facility];  Service: Endoscopy;  Laterality: N/A;  . EUS N/A 04/23/2018   Procedure: UPPER ENDOSCOPIC ULTRASOUND (EUS) RADIAL;  Surgeon: Milus Banister, MD;   [facility];  Service: Endoscopy;  Laterality: N/A;  . FINE NEEDLE ASPIRATION  04/23/2018   Procedure: FINE NEEDLE ASPIRATION;  Surgeon: Milus Banister, MD;  [facility];  Service: Endoscopy;;  . KNEE ARTHROSCOPY Left 07/27/2013   Procedure: LEFT ARTHROSCOPY KNEE WITH MEDIAL MENISCAL DEBRIDEMENT;  Surgeon: Gearlean Alf, MD;  [facility];  Service: Orthopedics;  Laterality: Left;  Marland Kitchen MASTECTOMY W/ SENTINEL NODE BIOPSY Bilateral 08/10/2014   Procedure: MASTECTOMY WITH SENTINEL LYMPH NODE BIOPSY;  Surgeon: [name], MD;  Location: [facility];  Service: General;  Laterality: Bilateral;  . PORT-A-CATH REMOVAL Right 06/01/2015   Procedure: REMOVAL PORT-A-CATH;  Surgeon: [name], MD;  Location: [facility];  Service: General;  Laterality: Right;  . PORTACATH PLACEMENT Right 09/07/2014   Procedure: INSERTION PORT-A-CATH;  Surgeon: [name], MD;  Location: [place];  Service: General;  Laterality: Right;  . TUBAL LIGATION  07/18/2010    Current Outpatient Medications  Medication Sig Dispense Refill  . Multiple Vitamin (MULTIVITAMIN WITH MINERALS) TABS tablet Take 1 tablet by mouth 3 (three) times a week.     No current facility-administered medications for this visit.    Allergies as of 07/04/2020  . (No Known Allergies)    Family History  Problem Relation Age of Onset  . Breast cancer Mother 64  . Lymphoma Maternal Aunt 71  . Uterine cancer Paternal Aunt 46  . Uterine cancer Paternal  Grandmother 77  . Ovarian cancer Other 109       mat great aunt through [facility]  . Ovarian cancer Maternal Grandmother 97    Social History   Socioeconomic History  . Marital status: Married    Spouse name: Not on file  . Number of children: Not on file  . Years of education: Not on file  . Highest education level: Not on file  Occupational History  . Not on file  Tobacco Use  . Smoking status: Former Smoker    Packs/day: 0.00    Years:  8.00    Pack years: 0.00    Quit date: 07/20/2000    Years since quitting: 19.9  . Smokeless tobacco: Never Used  Vaping Use  . Vaping Use: Never used  Substance and Sexual Activity  . Alcohol use: Yes    Comment: occasionally  . Drug use: No  . Sexual activity: Yes    Partners: Male    Comment: 1st intercourse- 38, partner-1, married- 25 yrs   Other Topics Concern  . Not on file  Social History Narrative  . Not on file   Social Determinants of Health   Financial Resource Strain: Not on file  Food Insecurity: Not on file  Transportation Needs: Not on file  Physical Activity: Not on file  Stress: Not on file  Social Connections: Not on file  Intimate Partner Violence: Not on file     Physical Exam: BP 120/82   Pulse 88   Ht 5\' 7"  (1.702 m)   Wt 219 lb (99.3 kg)   LMP 08/26/2014   BMI 34.30 kg/m  Constitutional: generally well-appearing Psychiatric: alert and oriented x3 Eyes: extraocular movements intact Mouth: oral pharynx moist, no lesions Neck: supple no lymphadenopathy Cardiovascular: heart regular rate and rhythm Lungs: clear to auscultation bilaterally Abdomen: soft, nontender, nondistended, no obvious ascites, no peritoneal signs, normal bowel sounds Extremities: no lower extremity edema bilaterally Skin: no lesions on visible extremities   Assessment and plan: 55 y.o. female with incidental pancreatic cyst  Persist has 0 out of 3 "concerning features" based on the 2015 AGA guidelines for incidental pancreatic cysts.  We have 40 years of data on her cyst and so I recommended repeat MRI of the pancreas with MRCP around now and if this again looks nonconcerning without any significant changes then she would not need any further surveillance.   Please see the "Patient Instructions" section for addition details about the plan.   Owens Loffler, MD [facility] 07/04/2020, 1:48 PM  Cc: [name], MD  Total time on date of encounter  was 45  minutes (this included time spent preparing to see the patient reviewing records; obtaining and/or reviewing separately obtained history; performing a medically appropriate exam and/or evaluation; counseling and educating the patient and family if present; ordering medications, tests or procedures if applicable; and documenting clinical information in the health record).

## 2020-07-11 ENCOUNTER — Other Ambulatory Visit: Payer: Self-pay

## 2020-07-11 ENCOUNTER — Ambulatory Visit (HOSPITAL_COMMUNITY)
Admission: RE | Admit: 2020-07-11 | Discharge: 2020-07-11 | Disposition: A | Discharge status: Home / Self Care | Payer: BC Managed Care – PPO | Source: Ambulatory Visit | Attending: Gastroenterology | Admitting: Gastroenterology

## 2020-07-11 ENCOUNTER — Other Ambulatory Visit: Payer: Self-pay | Admitting: Gastroenterology

## 2020-07-11 DIAGNOSIS — K862 Cyst of pancreas: Secondary | ICD-10-CM

## 2020-07-11 MED ORDER — GADOBUTROL 1 MMOL/ML IV SOLN
10.0000 mL | Freq: Once | INTRAVENOUS | Status: AC | PRN
  Administered 2020-07-11: 10 mL via INTRAVENOUS

## 2020-07-12 ENCOUNTER — Ambulatory Visit (HOSPITAL_COMMUNITY): Payer: BC Managed Care – PPO

## 2021-02-12 ENCOUNTER — Encounter: Payer: Self-pay | Admitting: Oncology

## 2021-02-22 ENCOUNTER — Other Ambulatory Visit: Payer: Self-pay | Admitting: *Deleted

## 2021-02-22 ENCOUNTER — Telehealth: Payer: Self-pay | Admitting: *Deleted

## 2021-02-22 DIAGNOSIS — Z170 Estrogen receptor positive status [ER+]: Secondary | ICD-10-CM

## 2021-02-22 NOTE — Telephone Encounter (Signed)
Referral for Dr.Geoffroy Sisk at [facility] was faxed to [phone]. Confirmation was received. Pt made aware through [place].

## 2021-03-08 ENCOUNTER — Encounter: Payer: Self-pay | Admitting: *Deleted

## 2021-08-23 ENCOUNTER — Encounter: Payer: Self-pay | Admitting: Radiology

## 2021-08-23 ENCOUNTER — Other Ambulatory Visit (HOSPITAL_COMMUNITY)
Admission: RE | Admit: 2021-08-23 | Discharge: 2021-08-23 | Disposition: A | Discharge status: Home / Self Care | Payer: BC Managed Care – PPO | Source: Ambulatory Visit | Attending: Radiology | Admitting: Radiology

## 2021-08-23 ENCOUNTER — Encounter: Payer: BC Managed Care – PPO | Admitting: Physical Therapy

## 2021-08-23 ENCOUNTER — Telehealth: Payer: Self-pay | Admitting: *Deleted

## 2021-08-23 ENCOUNTER — Ambulatory Visit (INDEPENDENT_AMBULATORY_CARE_PROVIDER_SITE_OTHER): Payer: BC Managed Care – PPO | Admitting: Radiology

## 2021-08-23 VITALS — BP 116/80 | Ht 67.0 in | Wt 210.0 lb

## 2021-08-23 DIAGNOSIS — M545 Low back pain, unspecified: Secondary | ICD-10-CM | POA: Diagnosis not present

## 2021-08-23 DIAGNOSIS — Z01419 Encounter for gynecological examination (general) (routine) without abnormal findings: Secondary | ICD-10-CM | POA: Diagnosis present

## 2021-08-23 DIAGNOSIS — M6289 Other specified disorders of muscle: Secondary | ICD-10-CM | POA: Diagnosis not present

## 2021-08-23 DIAGNOSIS — Z1211 Encounter for screening for malignant neoplasm of colon: Secondary | ICD-10-CM

## 2021-08-23 DIAGNOSIS — Z13820 Encounter for screening for osteoporosis: Secondary | ICD-10-CM

## 2021-08-23 LAB — URINALYSIS, COMPLETE W/RFL CULTURE
Bacteria, UA: NONE SEEN /HPF
Bilirubin Urine: NEGATIVE
Glucose, UA: NEGATIVE
Hgb urine dipstick: NEGATIVE
Ketones, ur: NEGATIVE
Leukocyte Esterase: NEGATIVE
Nitrites, Initial: NEGATIVE
Protein, ur: NEGATIVE
RBC / HPF: NONE SEEN /HPF (ref 0–2)
Specific Gravity, Urine: 1.01 (ref 1.001–1.035)
WBC, UA: NONE SEEN /HPF (ref 0–5)
pH: 5 (ref 5.0–8.0)

## 2021-08-23 LAB — NO CULTURE INDICATED

## 2021-08-23 NOTE — Progress Notes (Signed)
Lori [name] 07-Mar-1965 [id number]   History:  56 y.o. G2P2 presents for annual exam.Last visit 2020. She is s/p double mastectomy, chemo and tamoxifen use. Was d/c'd from oncology in 2022. She c/o worsening urinary incontinence and now low back pain x 1 month. No frequency or dysuria, has mild urgency. Back pain does not improve with NSAID use. She reports painful intercourse even with lubrication. Needs referral for colonoscopy. Has not had DEXA. Walks 3 miles/day, stopped since her back her been hurting. Has 2 teenage daughters.  Gynecologic History Patient's last menstrual period was 05/21/2014.   Contraception/Family planning: post menopausal status Sexually active: yes, painful  Last Pap: 2020. Results were: normal   Obstetric History OB History  Gravida Para Term Preterm AB Living  '2 2       2  '$ SAB IAB Ectopic Multiple Live Births               # Outcome Date GA Lbr Len/2nd Weight Sex Delivery Anes PTL Lv  2 Para           1 Para              The following portions of the patient's history were reviewed and updated as appropriate: allergies, current medications, past family history, past medical history, past social history, past surgical history, and problem list.  Review of Systems Pertinent items noted in HPI and remainder of comprehensive ROS otherwise negative.   Past medical history, past surgical history, family history and social history were all reviewed and documented in the EPIC chart.   Exam:  Vitals:   08/23/21 0759  BP: 116/80  Weight: 210 lb (95.3 kg)  Height: '5\' 7"'$  (1.702 m)   Body mass index is 32.89 kg/m.  General appearance:  Normal, overweight Thyroid:  Symmetrical, normal in size, without palpable masses or nodularity. Respiratory  Auscultation:  Clear without wheezing or rhonchi Cardiovascular  Auscultation:  Regular rate, without rubs, murmurs or gallops  Edema/varicosities:  Not grossly evident Abdominal  Soft,nontender,  without masses, guarding or rebound.  Liver/spleen:  No organomegaly noted  Hernia:  None appreciated  Skin  Inspection:  Grossly normal Breasts: Bilateral mastectomy without reconstruction  Genitourinary   Inguinal/mons:  Normal without inguinal adenopathy  External genitalia:  Normal appearing vulva with no masses, tenderness, or lesions  BUS/Urethra/Skene's glands:  Normal without masses or exudate  Vagina:  Normal appearing with normal color and discharge, no lesions  Cervix:  Normal appearing without discharge or lesions  Uterus:  Normal in size, shape and contour.  Mobile, nontender  Adnexa/parametria:     Rt: Normal in size, without masses or tenderness.   Lt: Normal in size, without masses or tenderness.  Anus and perineum: Normal Urine dipstick shows negative for all components.  Micro exam: negative for WBC's or RBC's.    Patient informed chaperone available to be present for breast and pelvic exam. Patient has requested no chaperone to be present. Patient has been advised what will be completed during breast and pelvic exam.   Assessment/Plan:   1. Well woman exam with routine gynecological exam  - Cytology - PAP( [facility]) -Schedule DEXA -establish with PCP  2. Acute bilateral low back pain without sciatica  - Urinalysis,Complete w/RFL Culture  3. Colon cancer screening GI referral  4. Pelvic floor dysfunction in female PFPT referral    Discussed colonoscopy and DEXA screening as directed/appropriate. Recommend 125mns of exercise weekly, including weight bearing exercise. Encouraged the use  of seatbelts and sunscreen. Return in 1 year for annual or as needed.   Kerry Dory WHNP-BC 8:34 AM 08/23/2021

## 2021-08-23 NOTE — Telephone Encounter (Signed)
-----   Message from Kerry Dory, NP sent at 08/23/2021  8:52 AM EDT ----- Regarding: Referrals Please refer to Dr Ardis Hughs, GI (at pt request) for colonoscopy. Also needs referral for pelvic floor PT for incontinence and vaginal dyspareunia.

## 2021-08-29 LAB — CYTOLOGY - PAP
Adequacy: ABNORMAL
Comment: NEGATIVE

## 2021-09-25 ENCOUNTER — Other Ambulatory Visit: Payer: Self-pay | Admitting: Radiology

## 2021-09-25 ENCOUNTER — Ambulatory Visit (INDEPENDENT_AMBULATORY_CARE_PROVIDER_SITE_OTHER): Payer: BC Managed Care – PPO

## 2021-09-25 DIAGNOSIS — Z780 Asymptomatic menopausal state: Secondary | ICD-10-CM | POA: Diagnosis not present

## 2021-09-25 DIAGNOSIS — Z13820 Encounter for screening for osteoporosis: Secondary | ICD-10-CM | POA: Diagnosis not present

## 2021-09-25 DIAGNOSIS — M8589 Other specified disorders of bone density and structure, multiple sites: Secondary | ICD-10-CM

## 2021-10-23 ENCOUNTER — Ambulatory Visit: Payer: BC Managed Care – PPO | Admitting: Radiology

## 2021-10-23 ENCOUNTER — Other Ambulatory Visit (HOSPITAL_COMMUNITY)
Admission: RE | Admit: 2021-10-23 | Discharge: 2021-10-23 | Disposition: A | Discharge status: Home / Self Care | Payer: BC Managed Care – PPO | Source: Ambulatory Visit | Attending: Radiology | Admitting: Radiology

## 2021-10-23 VITALS — BP 122/86

## 2021-10-23 DIAGNOSIS — R87615 Unsatisfactory cytologic smear of cervix: Secondary | ICD-10-CM | POA: Insufficient documentation

## 2021-10-23 NOTE — Progress Notes (Signed)
.    Lori [name] 02-23-1966 [id number]   History:  56 y.o. G2P2 presents for repeat pap- unsatisfactory 08/23/21  Gynecologic History Patient's last menstrual period was 05/21/2014.   Contraception/Family planning: post menopausal status Sexually active: yes, painful  Last Pap: 2020. Results were: normal   Obstetric History OB History  Gravida Para Term Preterm AB Living  '2 2       2  '$ SAB IAB Ectopic Multiple Live Births               # Outcome Date GA Lbr Len/2nd Weight Sex Delivery Anes PTL Lv  2 Para           1 Para              The following portions of the patient's history were reviewed and updated as appropriate: allergies, current medications, past family history, past medical history, past social history, past surgical history, and problem list.  Review of Systems Pertinent items noted in HPI and remainder of comprehensive ROS otherwise negative.   Past medical history, past surgical history, family history and social history were all reviewed and documented in the EPIC chart.   Exam:  Vitals:   10/23/21 0746  BP: 122/86   There is no height or weight on file to calculate BMI.  General appearance:  Normal, overweight Genitourinary   Inguinal/mons:  Normal without inguinal adenopathy  External genitalia:  Normal appearing vulva with no masses, tenderness, or lesions  BUS/Urethra/Skene's glands:  Normal without masses or exudate  Vagina:  atrophic  Cervix:  Normal appearing without discharge or lesions  Anus and perineum: Normal  Chaperone offered and declined  Assessment/Plan:    1. Unsatisfactory cervical Papanicolaou smear - Cytology - PAP( [place])    Rubbie Battiest B WHNP-BC 8:22 AM 10/23/2021

## 2021-10-26 LAB — CYTOLOGY - PAP
Comment: NEGATIVE
Diagnosis: NEGATIVE
High risk HPV: NEGATIVE

## 2022-11-08 ENCOUNTER — Encounter: Payer: Self-pay | Admitting: Family Medicine

## 2022-11-08 ENCOUNTER — Ambulatory Visit (INDEPENDENT_AMBULATORY_CARE_PROVIDER_SITE_OTHER): Payer: 59 | Admitting: Family Medicine

## 2022-11-08 VITALS — BP 115/84 | HR 75 | Ht 67.0 in | Wt 196.2 lb

## 2022-11-08 DIAGNOSIS — Z23 Encounter for immunization: Secondary | ICD-10-CM

## 2022-11-08 DIAGNOSIS — Z0000 Encounter for general adult medical examination without abnormal findings: Secondary | ICD-10-CM | POA: Diagnosis not present

## 2022-11-08 HISTORY — DX: Encounter for general adult medical examination without abnormal findings: Z00.00

## 2022-11-08 NOTE — Progress Notes (Signed)
New patient visit   Patient: Lori [name]   DOB: [birth year]   57 y.o. Female  MRN: [id number] Visit Date: 11/08/2022  Today's healthcare provider: Charlton Amor, DO   Chief Complaint  Patient presents with   New Patient (Initial Visit)    Establish care    SUBJECTIVE    Chief Complaint  Patient presents with   New Patient (Initial Visit)    Establish care   HPI Pt presents to establish care. Has no concerns today so we will do wellness.   In 2016 patient diagnosed with breast cancer. Had a double mastectomy. 2024 had reconstruction surgery. Is current on bactrim for a residual infection.   Family hx Htn: none DM: Mom Heart disease: Dad (deceased-CHF cause at 15) Cancers: mom-breast cancer MGM-ovarian cancer   Past smoker about 20+ years ago Alcohol: rarely  Lives with husband and two daughters and feels safe   Review of Systems  Constitutional:  Negative for activity change, fatigue and fever.  Respiratory:  Negative for cough and shortness of breath.   Cardiovascular:  Negative for chest pain.  Gastrointestinal:  Negative for abdominal pain.  Genitourinary:  Negative for difficulty urinating.       Current Meds  Medication Sig   sulfamethoxazole-trimethoprim (BACTRIM) 400-80 MG tablet Take 1 tablet by mouth every 12 (twelve) hours.    OBJECTIVE    BP 115/84 (BP Location: Right Arm, Patient Position: Sitting, Cuff Size: Normal)   Pulse 75   Ht 5\' 7"  (1.702 m)   Wt 196 lb 4 oz (89 kg)   LMP 05/21/2014   SpO2 98%   BMI 30.74 kg/m   Physical Exam Vitals and nursing note reviewed.  Constitutional:      General: She is not in acute distress.    Appearance: Normal appearance.  HENT:     Head: Normocephalic and atraumatic.     Right Ear: External ear normal.     Left Ear: External ear normal.     Nose: Nose normal.  Eyes:     Conjunctiva/sclera: Conjunctivae normal.  Cardiovascular:     Rate and Rhythm: Normal rate and regular rhythm.   Pulmonary:     Effort: Pulmonary effort is normal.     Breath sounds: Normal breath sounds.  Abdominal:     General: Abdomen is flat. Bowel sounds are normal.     Palpations: Abdomen is soft.     Tenderness: There is no abdominal tenderness.  Neurological:     General: No focal deficit present.     Mental Status: She is alert and oriented to person, place, and time.  Psychiatric:        Mood and Affect: Mood normal.        Behavior: Behavior normal.        Thought Content: Thought content normal.        Judgment: Judgment normal.        ASSESSMENT & PLAN    Problem List Items Addressed This Visit       Other   Routine adult health maintenance - Primary    Pt doing well  - due for lab work - getting flu shot today - declined Hep C and HIV screen      Relevant Orders   CBC   Basic Metabolic Panel (BMET)   Lipid panel    No follow-ups on file.      No orders of the defined types were placed in this encounter.  Orders Placed This Encounter  Procedures   CBC   Basic Metabolic Panel (BMET)    Order Specific Question:   Has the patient fasted?    Answer:   No   Lipid panel    Order Specific Question:   Has the patient fasted?    Answer:   No    Order Specific Question:   Release to patient    Answer:   Immediate     Charlton Amor, DO  [facility] at [facility] [phone] (phone) [phone] (fax)  [facility]

## 2022-11-08 NOTE — Patient Instructions (Signed)
Let me know about any heart palpitations  SO NICE MEETING YOU!

## 2022-11-08 NOTE — Assessment & Plan Note (Addendum)
Pt doing well  - due for lab work - getting flu shot today - declined Hep C and HIV screen

## 2022-11-09 LAB — CBC
Hematocrit: 44 % (ref 34.0–46.6)
Hemoglobin: 14.4 g/dL (ref 11.1–15.9)
MCH: 29.1 pg (ref 26.6–33.0)
MCHC: 32.7 g/dL (ref 31.5–35.7)
MCV: 89 fL (ref 79–97)
Platelets: 238 10*3/uL (ref 150–450)
RBC: 4.94 x10E6/uL (ref 3.77–5.28)
RDW: 12.7 % (ref 11.7–15.4)
WBC: 6.1 10*3/uL (ref 3.4–10.8)

## 2022-11-09 LAB — LIPID PANEL
Chol/HDL Ratio: 3.2 ratio (ref 0.0–4.4)
Cholesterol, Total: 203 mg/dL — ABNORMAL HIGH (ref 100–199)
HDL: 63 mg/dL
LDL Chol Calc (NIH): 126 mg/dL — ABNORMAL HIGH (ref 0–99)
Triglycerides: 80 mg/dL (ref 0–149)
VLDL Cholesterol Cal: 14 mg/dL (ref 5–40)

## 2022-11-09 LAB — BASIC METABOLIC PANEL
BUN/Creatinine Ratio: 19 (ref 9–23)
BUN: 14 mg/dL (ref 6–24)
CO2: 21 mmol/L (ref 20–29)
Calcium: 9.8 mg/dL (ref 8.7–10.2)
Chloride: 105 mmol/L (ref 96–106)
Creatinine, Ser: 0.75 mg/dL (ref 0.57–1.00)
Glucose: 81 mg/dL (ref 70–99)
Potassium: 4.4 mmol/L (ref 3.5–5.2)
Sodium: 141 mmol/L (ref 134–144)
eGFR: 95 mL/min/{1.73_m2} (ref >59)

## 2023-01-04 ENCOUNTER — Encounter: Payer: Self-pay | Admitting: Family Medicine

## 2023-01-10 ENCOUNTER — Encounter: Payer: Self-pay | Admitting: Family Medicine

## 2023-07-10 ENCOUNTER — Encounter: Payer: Self-pay | Admitting: Family Medicine

## 2024-01-01 ENCOUNTER — Ambulatory Visit: Admitting: Radiology

## 2024-03-24 ENCOUNTER — Encounter: Payer: Self-pay | Admitting: Radiology

## 2024-03-24 ENCOUNTER — Ambulatory Visit: Admitting: Radiology

## 2024-03-24 ENCOUNTER — Other Ambulatory Visit (HOSPITAL_COMMUNITY)
Admission: RE | Admit: 2024-03-24 | Discharge: 2024-03-24 | Disposition: A | Source: Ambulatory Visit | Attending: Radiology | Admitting: Radiology

## 2024-03-24 VITALS — BP 102/68 | HR 90 | Ht 67.0 in | Wt 223.0 lb

## 2024-03-24 DIAGNOSIS — N952 Postmenopausal atrophic vaginitis: Secondary | ICD-10-CM | POA: Diagnosis not present

## 2024-03-24 DIAGNOSIS — Z01419 Encounter for gynecological examination (general) (routine) without abnormal findings: Secondary | ICD-10-CM | POA: Diagnosis not present

## 2024-03-24 DIAGNOSIS — Z780 Asymptomatic menopausal state: Secondary | ICD-10-CM

## 2024-03-24 DIAGNOSIS — C50412 Malignant neoplasm of upper-outer quadrant of left female breast: Secondary | ICD-10-CM

## 2024-03-24 DIAGNOSIS — Z1331 Encounter for screening for depression: Secondary | ICD-10-CM | POA: Diagnosis not present

## 2024-03-24 DIAGNOSIS — Z170 Estrogen receptor positive status [ER+]: Secondary | ICD-10-CM

## 2024-03-24 DIAGNOSIS — M8580 Other specified disorders of bone density and structure, unspecified site: Secondary | ICD-10-CM

## 2024-03-24 MED ORDER — ESTRADIOL 10 MCG VA TABS: 1.0000 | ORAL_TABLET | VAGINAL | 4 refills | Status: AC

## 2024-03-24 NOTE — Patient Instructions (Signed)
 Preventive Care 57-57 Years Old, Female  Preventive care refers to lifestyle choices and visits with your health care provider that can promote health and wellness. Preventive care visits are also called wellness exams.  What can I expect for my preventive care visit?  Counseling  Your health care provider may ask you questions about your:  Medical history, including:  Past medical problems.  Family medical history.  Pregnancy history.  Current health, including:  Menstrual cycle.  Method of birth control.  Emotional well-being.  Home life and relationship well-being.  Sexual activity and sexual health.  Lifestyle, including:  Alcohol, nicotine or tobacco, and drug use.  Access to firearms.  Diet, exercise, and sleep habits.  Work and work Astronomer.  Sunscreen use.  Safety issues such as seatbelt and bike helmet use.  Physical exam  Your health care provider will check your:  Height and weight. These may be used to calculate your BMI (body mass index). BMI is a measurement that tells if you are at a healthy weight.  Waist circumference. This measures the distance around your waistline. This measurement also tells if you are at a healthy weight and may help predict your risk of certain diseases, such as type 2 diabetes and high blood pressure.  Heart rate and blood pressure.  Body temperature.  Skin for abnormal spots.  What immunizations do I need?    Vaccines are usually given at various ages, according to a schedule. Your health care provider will recommend vaccines for you based on your age, medical history, and lifestyle or other factors, such as travel or where you work.  What tests do I need?  Screening  Your health care provider may recommend screening tests for certain conditions. This may include:  Lipid and cholesterol levels.  Diabetes screening. This is done by checking your blood sugar (glucose) after you have not eaten for a while (fasting).  Pelvic exam and Pap test.  Hepatitis B test.  Hepatitis C  test.  HIV (human immunodeficiency virus) test.  STI (sexually transmitted infection) testing, if you are at risk.  Lung cancer screening.  Colorectal cancer screening.  Mammogram. Talk with your health care provider about when you should start having regular mammograms. This may depend on whether you have a family history of breast cancer.  BRCA-related cancer screening. This may be done if you have a family history of breast, ovarian, tubal, or peritoneal cancers.  Bone density scan. This is done to screen for osteoporosis.  Talk with your health care provider about your test results, treatment options, and if necessary, the need for more tests.  Follow these instructions at home:  Eating and drinking    Eat a diet that includes fresh fruits and vegetables, whole grains, lean protein, and low-fat dairy products.  Take vitamin and mineral supplements as recommended by your health care provider.  Do not drink alcohol if:  Your health care provider tells you not to drink.  You are pregnant, may be pregnant, or are planning to become pregnant.  If you drink alcohol:  Limit how much you have to 0-1 drink a day.  Know how much alcohol is in your drink. In the [place] one drink equals one 57 oz bottle of beer (355 mL), one 57 oz glass of wine (148 mL), or one 57 oz glass of hard liquor (44 mL).  Lifestyle  Brush your teeth every morning and night with fluoride toothpaste. Floss one time each day.  Exercise for at least  30 minutes 5 or more days each week.  Do not use any products that contain nicotine or tobacco. These products include cigarettes, chewing tobacco, and vaping devices, such as e-cigarettes. If you need help quitting, ask your health care provider.  Do not use drugs.  If you are sexually active, practice safe sex. Use a condom or other form of protection to prevent STIs.  If you do not wish to become pregnant, use a form of birth control. If you plan to become pregnant, see your health care provider for a  prepregnancy visit.  Take aspirin only as told by your health care provider. Make sure that you understand how much to take and what form to take. Work with your health care provider to find out whether it is safe and beneficial for you to take aspirin daily.  Find healthy ways to manage stress, such as:  Meditation, yoga, or listening to music.  Journaling.  Talking to a trusted person.  Spending time with friends and family.  Minimize exposure to UV radiation to reduce your risk of skin cancer.  Safety  Always wear your seat belt while driving or riding in a vehicle.  Do not drive:  If you have been drinking alcohol. Do not ride with someone who has been drinking.  When you are tired or distracted.  While texting.  If you have been using any mind-altering substances or drugs.  Wear a helmet and other protective equipment during sports activities.  If you have firearms in your house, make sure you follow all gun safety procedures.  Seek help if you have been physically or sexually abused.  What's next?  Visit your health care provider once a year for an annual wellness visit.  Ask your health care provider how often you should have your eyes and teeth checked.  Stay up to date on all vaccines.  This information is not intended to replace advice given to you by your health care provider. Make sure you discuss any questions you have with your health care provider.  Document Revised: 08/16/2020 Document Reviewed: 08/16/2020  Elsevier Patient Education  2024 [employer].

## 2024-03-24 NOTE — Progress Notes (Signed)
 "  Lori [name] [birth year] [id number]   History:  59 y.o. G2P2 presents for annual exam. She is s/p double mastectomy, chemo and tamoxifen  use. Was d/c'd from oncology in 2022. Reconstruction done with Dr Deane at [facility]. Still having discomfort with vaginal atrophy and pain with intercourse. Looking for a new PCP. Due for repeat DEXA  Gynecologic History Patient's last menstrual period was 05/21/2014.   Contraception/Family planning: post menopausal status Sexually active: yes, painful  Last Pap: 2023. Results were: normal, HPV negative DEXA 2023: osteopenia Colonoscopy 2024  Obstetric History OB History  Gravida Para Term Preterm AB Living  2 2    2   SAB IAB Ectopic Multiple Live Births          # Outcome Date GA Lbr Len/2nd Weight Sex Type Anes PTL Lv  2 Para           1 Para               03/24/2024    7:57 AM 06/16/2015    2:58 PM 02/22/2015   10:10 AM  Depression screen PHQ 2/9  Decreased Interest 0 0 1  Down, Depressed, Hopeless 0 0 1  PHQ - 2 Score 0 0 2  Altered sleeping   2  Tired, decreased energy   3  Change in appetite   2  Feeling bad or failure about yourself    0  Trouble concentrating   2  Moving slowly or fidgety/restless   0  Suicidal thoughts   0   PHQ-9 Score   11      Data saved with a previous flowsheet row definition     The following portions of the patient's history were reviewed and updated as appropriate: allergies, current medications, past family history, past medical history, past social history, past surgical history, and problem list.  Review of Systems Pertinent items noted in HPI and remainder of comprehensive ROS otherwise negative.   Past medical history, past surgical history, family history and social history were all reviewed and documented in the EPIC chart.   Exam:  Vitals:   03/24/24 0755  BP: 102/68  Pulse: 90  SpO2: 98%  Weight: 223 lb (101.2 kg)  Height: 5' 7 (1.702 m)   Body mass index is 34.93  kg/m.  General appearance:  Normal, overweight Thyroid:  Symmetrical, normal in size, without palpable masses or nodularity. Respiratory  Auscultation:  Clear without wheezing or rhonchi Cardiovascular  Auscultation:  Regular rate, without rubs, murmurs or gallops  Edema/varicosities:  Not grossly evident Abdominal  Soft,nontender, without masses, guarding or rebound.  Liver/spleen:  No organomegaly noted  Hernia:  None appreciated  Skin  Inspection:  Grossly normal Breasts: Bilateral mastectomy with reconstruction, no masses or skin changes Genitourinary   Inguinal/mons:  Normal without inguinal adenopathy  External genitalia:  Normal appearing vulva with no masses, tenderness, or lesions  BUS/Urethra/Skene's glands:  Normal without masses or exudate  Vagina:  Normal appearing with normal color and discharge, no lesions  Cervix:  Normal appearing without discharge or lesions  Uterus:  Normal in size, shape and contour.  Mobile, nontender  Adnexa/parametria:     Rt: Normal in size, without masses or tenderness.   Lt: Normal in size, without masses or tenderness.  Anus and perineum: Normal    Darice Hoit, CMA present for exam  Assessment/Plan:   1. Well woman exam with routine gynecological exam (Primary) - Cytology - PAP( [place])  2. Malignant neoplasm of  upper-outer quadrant of left breast in female, estrogen receptor positive [facility]) S/p reconstruction with Dr Deane at [facility]  3. Osteopenia after menopause - DG Bone Density; Future  4. Vaginal atrophy - Estradiol  10 MCG TABS vaginal tablet; Place 1 tablet (10 mcg total) vaginally 2 (two) times a week.  Dispense: 24 tablet; Refill: 4  5. Depression screening    Return in 1 year for annual or as needed.   Tyshell Ramberg B WHNP-BC 8:00 AM 03/24/2024  "

## 2024-03-25 ENCOUNTER — Ambulatory Visit: Payer: Self-pay | Admitting: Radiology

## 2024-03-25 LAB — CYTOLOGY - PAP: Diagnosis: NEGATIVE
